# Patient Record
Sex: Female | Born: 1975 | Race: White | Hispanic: No | Marital: Married | Smoking: Never smoker
Health system: Southern US, Community
[De-identification: ages and names within clinical notes are randomized; demographics above are authoritative.]

## PROBLEM LIST (undated history)

## (undated) DIAGNOSIS — G4730 Sleep apnea, unspecified: Secondary | ICD-10-CM

## (undated) DIAGNOSIS — I10 Essential (primary) hypertension: Secondary | ICD-10-CM

## (undated) DIAGNOSIS — M1990 Unspecified osteoarthritis, unspecified site: Secondary | ICD-10-CM

## (undated) DIAGNOSIS — K760 Fatty (change of) liver, not elsewhere classified: Secondary | ICD-10-CM

## (undated) DIAGNOSIS — Z9889 Other specified postprocedural states: Secondary | ICD-10-CM

## (undated) DIAGNOSIS — K649 Unspecified hemorrhoids: Secondary | ICD-10-CM

## (undated) DIAGNOSIS — R7303 Prediabetes: Secondary | ICD-10-CM

## (undated) DIAGNOSIS — Z8719 Personal history of other diseases of the digestive system: Secondary | ICD-10-CM

## (undated) DIAGNOSIS — K219 Gastro-esophageal reflux disease without esophagitis: Secondary | ICD-10-CM

## (undated) DIAGNOSIS — R112 Nausea with vomiting, unspecified: Secondary | ICD-10-CM

## (undated) DIAGNOSIS — F32A Depression, unspecified: Secondary | ICD-10-CM

## (undated) DIAGNOSIS — R0600 Dyspnea, unspecified: Secondary | ICD-10-CM

## (undated) DIAGNOSIS — E781 Pure hyperglyceridemia: Secondary | ICD-10-CM

## (undated) DIAGNOSIS — T4145XA Adverse effect of unspecified anesthetic, initial encounter: Secondary | ICD-10-CM

## (undated) DIAGNOSIS — D649 Anemia, unspecified: Secondary | ICD-10-CM

## (undated) DIAGNOSIS — E119 Type 2 diabetes mellitus without complications: Secondary | ICD-10-CM

## (undated) DIAGNOSIS — T8859XA Other complications of anesthesia, initial encounter: Secondary | ICD-10-CM

## (undated) DIAGNOSIS — K900 Celiac disease: Secondary | ICD-10-CM

## (undated) DIAGNOSIS — R000 Tachycardia, unspecified: Secondary | ICD-10-CM

## (undated) DIAGNOSIS — F419 Anxiety disorder, unspecified: Secondary | ICD-10-CM

## (undated) DIAGNOSIS — J45909 Unspecified asthma, uncomplicated: Secondary | ICD-10-CM

## (undated) DIAGNOSIS — E282 Polycystic ovarian syndrome: Secondary | ICD-10-CM

## (undated) DIAGNOSIS — E0590 Thyrotoxicosis, unspecified without thyrotoxic crisis or storm: Secondary | ICD-10-CM

## (undated) HISTORY — DX: Pure hyperglyceridemia: E78.1

## (undated) HISTORY — DX: Polycystic ovarian syndrome: E28.2

## (undated) HISTORY — DX: Unspecified hemorrhoids: K64.9

## (undated) HISTORY — DX: Type 2 diabetes mellitus without complications: E11.9

## (undated) HISTORY — DX: Prediabetes: R73.03

## (undated) HISTORY — DX: Celiac disease: K90.0

---

## 2000-02-08 HISTORY — PX: APPENDECTOMY: SHX54

## 2003-02-08 HISTORY — PX: NISSEN FUNDOPLICATION: SHX2091

## 2014-06-09 ENCOUNTER — Encounter: Payer: Self-pay | Admitting: "Endocrinology

## 2015-01-20 ENCOUNTER — Encounter: Payer: Self-pay | Admitting: "Endocrinology

## 2015-03-02 ENCOUNTER — Encounter: Payer: Self-pay | Admitting: Internal Medicine

## 2015-03-17 ENCOUNTER — Ambulatory Visit: Payer: Self-pay | Admitting: Gastroenterology

## 2015-03-19 ENCOUNTER — Encounter: Payer: Self-pay | Admitting: Gastroenterology

## 2015-03-19 ENCOUNTER — Ambulatory Visit (INDEPENDENT_AMBULATORY_CARE_PROVIDER_SITE_OTHER): Payer: Managed Care, Other (non HMO) | Admitting: Gastroenterology

## 2015-03-19 VITALS — BP 149/90 | HR 83 | Temp 97.1°F | Ht 62.0 in | Wt 317.6 lb

## 2015-03-19 DIAGNOSIS — R945 Abnormal results of liver function studies: Secondary | ICD-10-CM

## 2015-03-19 DIAGNOSIS — R1011 Right upper quadrant pain: Secondary | ICD-10-CM | POA: Diagnosis not present

## 2015-03-19 DIAGNOSIS — R1031 Right lower quadrant pain: Secondary | ICD-10-CM

## 2015-03-19 DIAGNOSIS — K760 Fatty (change of) liver, not elsewhere classified: Secondary | ICD-10-CM | POA: Diagnosis not present

## 2015-03-19 DIAGNOSIS — R7989 Other specified abnormal findings of blood chemistry: Secondary | ICD-10-CM

## 2015-03-19 DIAGNOSIS — K625 Hemorrhage of anus and rectum: Secondary | ICD-10-CM | POA: Insufficient documentation

## 2015-03-19 NOTE — Assessment & Plan Note (Signed)
Colicky postprandial right upper quadrant pain for 4-5 years, intermittent. Cannot exclude biliary dyskinesia. Retrieve copy of labs, ultrasound, CT for review. Further recommendations to follow. May ultimately need a HIDA scan with [facility].

## 2015-03-19 NOTE — Progress Notes (Signed)
CC'D TO PCP °

## 2015-03-19 NOTE — Assessment & Plan Note (Signed)
Retrieve copy of ultrasound and most recent LFTs.  Instructions for fatty liver: Recommend 1-2# weight loss per week until ideal body weight through exercise & diet. Low fat/cholesterol diet.   Avoid sweets, sodas, fruit juices, sweetened beverages like tea, etc. Gradually increase exercise from 15 min daily up to 1 hr per day 5 days/week. Limit alcohol use.  Needs to have LFTs checked at least once per year. Discussed Melissa Schmidt could progress to cirrhosis with time. She needs to make an effort for regular exercise and weight loss.

## 2015-03-19 NOTE — Progress Notes (Signed)
Primary Care Physician:  Andres Shad, MD Referring provider: Myles Gip, MD Primary Gastroenterologist:  Garfield Cornea, MD   Chief Complaint  Patient presents with  . Abdominal Pain    right side    HPI:  Melissa Schmidt is a 40 y.o. female here at the request of her gynecologist Dr. Garnet Koyanagi, for further evaluation of right lower quadrant pain. Patient states that she developed right lower quadrant pain back around December 2016. She was waiting in a store in [place] started having vague right lower quadrant pain. Pain progressed over the next couple of days to the severity she went to the emergency department for evaluation. Radiation into the right lower back. Worse with movement. Unrelated to meals. Unrelated to bowel function. Reports unremarkable CT done. Has not been given any type of trial of medication. She did see her gynecologist, Dr. Garnet Koyanagi, pelvic exam benign, including pelvic ultrasound without explanation for her pain.  Patient also has a history of right upper quadrant, colicky pain over the past 4-5 years. Worse with meals, especially greasy foods. Sometimes associated with diarrhea. Some nausea but none recently. No vomiting. Abdominal ultrasound about a year ago she describes as being unremarkable. States she never had the HIDA scan done because the hospital wanted $1500 upfront before they filed her insurance and she couldn't afford it. History of Nissen fundoplication in 8416. Right upper quadrant pain associated with meals. Occasional bright red blood per rectum on the toilet tissue. States she has hemorrhoids since her daughter was born 63 years ago. No prior colonoscopy.  She states she was told she had fatty liver at some point. Reports her weight has been stable over the past year. Gives history of hypertriglyceridemia.   Current Outpatient Prescriptions  Medication Sig Dispense Refill  . metFORMIN (GLUCOPHAGE) 500 MG tablet Take 500 mg by mouth 2 (two)  times daily with a meal.    . Multiple Vitamin (MULTIVITAMIN) capsule Take 1 capsule by mouth daily.    . Omega-3 Fatty Acids (OMEGA-3 FISH OIL PO) Take by mouth daily.     No current facility-administered medications for this visit.    Allergies as of 03/19/2015  . (No Known Allergies)    Past Medical History  Diagnosis Date  . PCOS (polycystic ovarian syndrome)   . Borderline diabetes   . Hypertriglyceridemia     Past Surgical History  Procedure Laterality Date  . Appendectomy  2002  . Nissen fundoplication  6063    Family History  Problem Relation Age of Onset  . Irritable bowel syndrome Mother   . Inflammatory bowel disease Neg Hx   . Colon cancer Neg Hx   . Liver disease Neg Hx     Social History   Social History  . Marital Status: Unknown    Spouse Name: N/A  . Number of Children: 1  . Years of Education: N/A   Occupational History  . in school    Social History Main Topics  . Smoking status: Never Smoker   . Smokeless tobacco: Not on file  . Alcohol Use: No  . Drug Use: No  . Sexual Activity: Not on file   Other Topics Concern  . Not on file   Social History Narrative  . No narrative on file      ROS:  General: Negative for anorexia, weight loss, fever, chills, fatigue, weakness. Eyes: Negative for vision changes.  ENT: Negative for hoarseness, difficulty swallowing , nasal congestion. CV: Negative for chest pain, angina, palpitations,  dyspnea on exertion, peripheral edema.  Respiratory: Negative for dyspnea at rest, dyspnea on exertion, cough, sputum, wheezing.  GI: See history of present illness. GU:  Negative for dysuria, hematuria, urinary incontinence, urinary frequency, nocturnal urination.  MS: Negative for joint pain, low back pain.  Derm: Negative for rash or itching.  Neuro: Negative for weakness, abnormal sensation, seizure, frequent headaches, memory loss, confusion.  Psych: Negative for anxiety, depression, suicidal ideation,  hallucinations.  Endo: Negative for unusual weight change.  Heme: Negative for bruising or bleeding. Allergy: Negative for rash or hives.    Physical Examination:  BP 149/90 mmHg  Pulse 83  Temp(Src) 97.1 F (36.2 C) (Oral)  Ht 5' 2"  (1.575 m)  Wt 317 lb 9.6 oz (144.062 kg)  BMI 58.07 kg/m2  LMP 02/11/2015   General: Well-nourished, well-developed in no acute distress.  Head: Normocephalic, atraumatic.   Eyes: Conjunctiva pink, no icterus. Mouth: Oropharyngeal mucosa moist and pink , no lesions erythema or exudate. Neck: Supple without thyromegaly, masses, or lymphadenopathy.  Lungs: Clear to auscultation bilaterally.  Heart: Regular rate and rhythm, no murmurs rubs or gallops.  Abdomen: Bowel sounds are normal, mild right lower quadrant tenderness with palpation and right upper quadrant tenderness with palpation, nondistended but obese, no hepatosplenomegaly or masses, no abdominal bruits or    hernia , no rebound or guarding.   Rectal: Not performed Extremities: No lower extremity edema. No clubbing or deformities.  Neuro: Alert and oriented x 4 , grossly normal neurologically.  Skin: Warm and dry, no rash or jaundice.   Psych: Alert and cooperative, normal mood and affect.  Labs: Requested  Imaging Studies: No results found.

## 2015-03-19 NOTE — Assessment & Plan Note (Signed)
Two-month history of intermittent right lower quadrant tenderness worse with movement, unrelated to bowel function or meals. Remote appendectomy. Suspect musculoskeletal component. Retrieve labs and CT for review. Further recommendations to follow.

## 2015-03-19 NOTE — Assessment & Plan Note (Signed)
Intermittent bright red blood per rectum on the toilet tissue, she feels is related to hemorrhoids. Consider colonoscopy at some point in the near future. Await records prior to proceeding.

## 2015-03-19 NOTE — Patient Instructions (Signed)
1. We will request records of prior labs, abdominal ultrasound and CT reports for review. Further recommendations to follow.   Instructions for fatty liver: Recommend 1-2# weight loss per week until ideal body weight through exercise & diet. Low fat/cholesterol diet.   Avoid sweets, sodas, fruit juices, sweetened beverages like tea, etc. Gradually increase exercise from 15 min daily up to 1 hr per day 5 days/week. Limit alcohol use.

## 2015-04-01 NOTE — Progress Notes (Signed)
Labs and 01/20/2015 Glucose 102, BUN 10, creatinine 0.66, albumin 3.8, total bilirubin 0.3, alkaline phosphatase 79, AST 41 high, ALT 49 high, hemoglobin A1c 6.7 high  Labs from May 2016, white blood cell count 11,300, hemoglobin 12.6, platelets 363,000  Abdominal ultrasound dated 5 2016 with fatty liver associated with mild hepatomegaly, gallbladder normal, common bile duct 3 mm, spleen normal  NEVER RECEIVED CT REPORT. PLEASE TRY AGAIN. NEED ASAP TO DETERMINE PLAN OF CARE.

## 2015-04-01 NOTE — Progress Notes (Signed)
Called patient to verify where she had CT done and she said [facility]. She is going to call them and have them fax to [place] and I also asked for her to call me back with their fax number to resend release.

## 2015-04-01 NOTE — Progress Notes (Signed)
CT of the abdomen and pelvis with contrast dated 02/06/2015 report reviewed. Fatty liver and no other abnormalities.  Please let patient know that I have reviewed all of her records.  1. At this point, we need few labs to evaluate abnormal LFTs, suspect fatty liver but need to exclude few other things. (labs entered). 2. Also would recommend ibuprofen 691m twice daily for two weeks for the RLQ pain which is likely musculoskeletal.  3. Return to the office in 2-3 weeks. At that time will discuss if need to consider further work up based on symptoms (may offer colonoscopy and HIDA).

## 2015-04-01 NOTE — Addendum Note (Signed)
Addended by: Mahala Menghini on: 04/01/2015 04:40 PM   Modules accepted: Orders

## 2015-04-03 NOTE — Progress Notes (Signed)
Pt is aware. Lab order faxed to [facility]. She will go there and have it done.  Please schedule ov.

## 2015-04-03 NOTE — Progress Notes (Signed)
Pt goes to school on Tuesdays and cannot come in on that day

## 2015-04-06 ENCOUNTER — Encounter: Payer: Self-pay | Admitting: Internal Medicine

## 2015-04-06 NOTE — Progress Notes (Signed)
APPT MADE AND LETTER SENT  °

## 2015-04-08 LAB — HEPATITIS C ANTIBODY: HCV Ab: NEGATIVE

## 2015-04-08 LAB — HEPATITIS B SURFACE ANTIGEN: Hepatitis B Surface Ag: NEGATIVE

## 2015-04-08 LAB — ANA: Anti Nuclear Antibody(ANA): NEGATIVE

## 2015-04-08 LAB — IGG, IGA, IGM
IgA: 217 mg/dL (ref 69–380)
IgG (Immunoglobin G), Serum: 1370 mg/dL (ref 690–1700)
IgM, Serum: 187 mg/dL (ref 52–322)

## 2015-04-08 LAB — IRON AND TIBC
%SAT: 13 % (ref 11–50)
Iron: 36 ug/dL — ABNORMAL LOW (ref 40–190)
TIBC: 271 ug/dL (ref 250–450)
UIBC: 235 ug/dL (ref 125–400)

## 2015-04-08 LAB — FERRITIN: Ferritin: 144 ng/mL (ref 10–154)

## 2015-04-08 LAB — TISSUE TRANSGLUTAMINASE, IGA: Tissue Transglutaminase Ab, IgA: 19 U/mL — ABNORMAL HIGH (ref <4)

## 2015-04-09 LAB — MITOCHONDRIAL/SMOOTH MUSCLE AB PNL
Mitochondrial M2 Ab, IgG: <=20 Units (ref <=20.0)
Smooth Muscle Ab: <20 U (ref <20)

## 2015-04-09 LAB — CERULOPLASMIN: Ceruloplasmin: 37 mg/dL (ref 18–53)

## 2015-04-13 NOTE — Progress Notes (Signed)
Quick Note:  Her labs are negative for viral hepatitis, Wilson's disease, autoimmune hepatitis, PBC, hemochromatosis.  Her screen is positive for celiac disease.  Keep upcoming OV, she will need TCS/EGd WITH sb BX. Please let patient know: DO NOT start gluten free diet because it will mess up the small bowel bx. ______

## 2015-04-23 ENCOUNTER — Ambulatory Visit: Payer: Managed Care, Other (non HMO) | Admitting: Gastroenterology

## 2015-04-29 ENCOUNTER — Other Ambulatory Visit: Payer: Self-pay

## 2015-04-29 ENCOUNTER — Ambulatory Visit (INDEPENDENT_AMBULATORY_CARE_PROVIDER_SITE_OTHER): Payer: Managed Care, Other (non HMO) | Admitting: Gastroenterology

## 2015-04-29 ENCOUNTER — Encounter: Payer: Self-pay | Admitting: Gastroenterology

## 2015-04-29 VITALS — BP 147/93 | HR 92 | Temp 98.0°F | Ht 62.0 in | Wt 321.2 lb

## 2015-04-29 DIAGNOSIS — R1031 Right lower quadrant pain: Secondary | ICD-10-CM

## 2015-04-29 DIAGNOSIS — R6889 Other general symptoms and signs: Secondary | ICD-10-CM | POA: Diagnosis not present

## 2015-04-29 DIAGNOSIS — R1011 Right upper quadrant pain: Secondary | ICD-10-CM

## 2015-04-29 DIAGNOSIS — K625 Hemorrhage of anus and rectum: Secondary | ICD-10-CM | POA: Diagnosis not present

## 2015-04-29 DIAGNOSIS — K760 Fatty (change of) liver, not elsewhere classified: Secondary | ICD-10-CM

## 2015-04-29 DIAGNOSIS — R768 Other specified abnormal immunological findings in serum: Secondary | ICD-10-CM | POA: Insufficient documentation

## 2015-04-29 MED ORDER — PEG 3350-KCL-NA BICARB-NACL 420 G PO SOLR: 4000.0000 mL | Freq: Once | ORAL | Status: DC

## 2015-04-29 NOTE — Progress Notes (Signed)
Primary Care Physician: Andres Shad, MD  Primary Gastroenterologist:  Garfield Cornea, MD   Chief Complaint  Patient presents with  . Abdominal Pain    HPI: Melissa Schmidt is a 40 y.o. female here for follow-up. She is seen back on 03/19/2015 for right-sided abdominal pain. She has 2 different right-sided pains. One in the right lower quadrant worse with movement, unrelated to meals or bowel movements. This began in December 2016. She also has a colicky right upper quadrant pain for 4-5 years, worse with meals, especially greasy foods. Sometimes associated with diarrhea and nausea. No vomiting. History of [place] fundoplication in 2330, last EGD done prior to the procedure. She also has intermittent bright red blood on the toilet tissue.  Retrieved records which showed elevated AST/ALT (minimal). Abdominal ultrasound from 2016 with fatty liver associated with mild hepatomegaly, gallbladder was normal. CT of the abdomen pelvis with contrast in December 2016 with fatty liver but no other abnormalities.  Bloodwork was done in February with negative ANA, mitochondrial and smooth muscle antibody, normal immunoglobulins, normal ceruloplasmin, ferritin, negative hepatitis B and C serologies. She did have a positive TTG.  She continues to have right lower quadrant abdominal pain which is worse with movement, cough. She continues to have right upper quadrant pain with radiation into the right upper back associated with nausea which occurs postprandially. Intermittent diarrhea. Intermittent bright red blood per rectum. Trial of NSAIDs did not help her musculoskeletal pain.   Current Outpatient Prescriptions  Medication Sig Dispense Refill  . metFORMIN (GLUCOPHAGE) 500 MG tablet Take 500 mg by mouth 2 (two) times daily with a meal.    . Multiple Vitamin (MULTIVITAMIN) capsule Take 1 capsule by mouth daily.    . Omega-3 Fatty Acids (OMEGA-3 FISH OIL PO) Take by mouth daily.     No current  facility-administered medications for this visit.    Allergies as of 04/29/2015  . (No Known Allergies)   Past Medical History  Diagnosis Date  . PCOS (polycystic ovarian syndrome)   . Borderline diabetes   . Hypertriglyceridemia    Past Surgical History  Procedure Laterality Date  . Appendectomy  2002  . Nissen fundoplication  0762   Family History  Problem Relation Age of Onset  . Irritable bowel syndrome Mother   . Inflammatory bowel disease Neg Hx   . Colon cancer Neg Hx   . Liver disease Neg Hx    Social History   Social History  . Marital Status: Unknown    Spouse Name: N/A  . Number of Children: 1  . Years of Education: N/A   Occupational History  . in school    Social History Main Topics  . Smoking status: Never Smoker   . Smokeless tobacco: None  . Alcohol Use: No  . Drug Use: No  . Sexual Activity: Not Asked   Other Topics Concern  . None   Social History Narrative     ROS:  General: Negative for anorexia, weight loss, fever, chills, fatigue, weakness. ENT: Negative for hoarseness, difficulty swallowing , nasal congestion. CV: Negative for chest pain, angina, palpitations, dyspnea on exertion, peripheral edema.  Respiratory: Negative for dyspnea at rest, dyspnea on exertion, cough, sputum, wheezing.  GI: See history of present illness. GU:  Negative for dysuria, hematuria, urinary incontinence, urinary frequency, nocturnal urination.  Endo: Negative for unusual weight change.    Physical Examination:   BP 147/93 mmHg  Pulse 92  Temp(Src) 98 F (36.7  C) (Oral)  Ht 5' 2"  (1.575 m)  Wt 321 lb 3.2 oz (145.695 kg)  BMI 58.73 kg/m2  General: Well-nourished, well-developed in no acute distress.  Eyes: No icterus. Mouth: Oropharyngeal mucosa moist and pink , no lesions erythema or exudate. Lungs: Clear to auscultation bilaterally.  Heart: Regular rate and rhythm, no murmurs rubs or gallops.  Abdomen: Bowel sounds are normal, nontender,  nondistended, no hepatosplenomegaly or masses, no abdominal bruits or hernia , no rebound or guarding.   Extremities: No lower extremity edema. No clubbing or deformities. Neuro: Alert and oriented x 4   Skin: Warm and dry, no jaundice.   Psych: Alert and cooperative, normal mood and affect.  Labs:   See above  Imaging Studies: No results found.  Impression/plan:  40 year old morbidly obese female who presents for follow-up of colicky right upper quadrant abdominal pain postprandially, right lower quadrant abdominal pain likely musculoskeletal, intermittent rectal bleeding/diarrhea. Noted to have fatty liver with mild hepatomegaly on imaging studies which led to lab workup. She was noted to have a positive TTG but other serologies unremarkable as outlined above. No mentioned above she does have some solid food dysphagia since her Nissen fundoplication over 10 years ago.   She needs upper endoscopy with small bowel biopsy for confirmation of celiac disease +/- esophageal dilation It is unclear that this is the etiology of her abdominal symptoms at this time. Based on findings, she may ultimately need to have completion of gallbladder workup. Also needs colonoscopy for intermittent bright red blood per rectum. Patient states she had difficult conscious sedation in the past. Plan on deep sedation in the OR given this history.  I have discussed the risks, alternatives, benefits with regards to but not limited to the risk of reaction to medication, bleeding, infection, perforation and the patient is agreeable to proceed. Written consent to be obtained.  Fatty liver. Instructions for fatty liver: Recommend 1-2# weight loss per week until ideal body weight through exercise & diet. Low fat/cholesterol diet.   Avoid sweets, sodas, fruit juices, sweetened beverages like tea, etc. Gradually increase exercise from 15 min daily up to 1 hr per day 5 days/week. Limit alcohol use.

## 2015-04-29 NOTE — Patient Instructions (Signed)
1. Colonoscopy and upper endoscopy with possible esophageal dilation and small bowel biopsy with Dr. Gala Romney. See separate instructions.  2. Once results are back, we will decide if you need further gallbladder work up and discuss celiac disease further if positive biopsies.   Instructions for fatty liver: Recommend 1-2# weight loss per week until ideal body weight through exercise & diet. Low fat/cholesterol diet.   Avoid sweets, sodas, fruit juices, sweetened beverages like tea, etc. Gradually increase exercise from 15 min daily up to 1 hr per day 5 days/week. Limit alcohol use.

## 2015-04-29 NOTE — Progress Notes (Signed)
CC'D TO PCP °

## 2015-05-20 NOTE — Patient Instructions (Signed)
Melissa Schmidt  05/20/2015     @PREFPERIOPPHARMACY @   Your procedure is scheduled on 05/25/2015.  Report to [facility] at 9:00 A.M.  Call this number if you have problems the morning of surgery:  [phone]   Remember:  Do not eat food or drink liquids after midnight.  Take these medicines the morning of surgery with A SIP OF WATER Flonase   Do not wear jewelry, make-up or nail polish.  Do not wear lotions, powders, or perfumes.  You may wear deodorant.  Do not shave 48 hours prior to surgery.  Men may shave face and neck.  Do not bring valuables to the hospital.  [facility] is not responsible for any belongings or valuables.  Contacts, dentures or bridgework may not be worn into surgery.  Leave your suitcase in the car.  After surgery it may be brought to your room.  For patients admitted to the hospital, discharge time will be determined by your treatment team.  Patients discharged the day of surgery will not be allowed to drive home.    Please read over the following fact sheets that you were given. Anesthesia Post-op Instructions     PATIENT INSTRUCTIONS POST-ANESTHESIA  IMMEDIATELY FOLLOWING SURGERY:  Do not drive or operate machinery for the first twenty four hours after surgery.  Do not make any important decisions for twenty four hours after surgery or while taking narcotic pain medications or sedatives.  If you develop intractable nausea and vomiting or a severe headache please notify your doctor immediately.  FOLLOW-UP:  Please make an appointment with your surgeon as instructed. You do not need to follow up with anesthesia unless specifically instructed to do so.  WOUND CARE INSTRUCTIONS (if applicable):  Keep a dry clean dressing on the anesthesia/puncture wound site if there is drainage.  Once the wound has quit draining you may leave it open to air.  Generally you should leave the bandage intact for twenty four hours unless there is drainage.  If the epidural  site drains for more than 36-48 hours please call the anesthesia department.  QUESTIONS?:  Please feel free to call your physician or the hospital operator if you have any questions, and they will be happy to assist you.      Esophagogastroduodenoscopy Esophagogastroduodenoscopy (EGD) is a procedure that is used to examine the lining of the esophagus, stomach, and first part of the small intestine (duodenum). A long, flexible, lighted tube with a camera attached (endoscope) is inserted down the throat to view these organs. This procedure is done to detect problems or abnormalities, such as inflammation, bleeding, ulcers, or growths, in order to treat them. The procedure lasts 5-20 minutes. It is usually an outpatient procedure, but it may need to be performed in a hospital in emergency cases. LET [facility] CARE PROVIDER KNOW ABOUT:  Any allergies you have.  All medicines you are taking, including vitamins, herbs, eye drops, creams, and over-the-counter medicines.  Previous problems you or members of your family have had with the use of anesthetics.  Any blood disorders you have.  Previous surgeries you have had.  Medical conditions you have. RISKS AND COMPLICATIONS Generally, this is a safe procedure. However, problems can occur and include:  Infection.  Bleeding.  Tearing (perforation) of the esophagus, stomach, or duodenum.  Difficulty breathing or not being able to breathe.  Excessive sweating.  Spasms of the larynx.  Slowed heartbeat.  Low blood pressure. BEFORE THE PROCEDURE  Do not  eat or drink anything after midnight on the night before the procedure or as directed by your health care provider.  Do not take your regular medicines before the procedure if your health care provider asks you not to. Ask your health care provider about changing or stopping those medicines.  If you wear dentures, be prepared to remove them before the procedure.  Arrange for someone to  drive you home after the procedure. PROCEDURE  A numbing medicine (local anesthetic) may be sprayed in your throat for comfort and to stop you from gagging or coughing.  You will have an IV tube inserted in a vein in your hand or arm. You will receive medicines and fluids through this tube.  You will be given a medicine to relax you (sedative).  A pain reliever will be given through the IV tube.  A mouth guard may be placed in your mouth to protect your teeth and to keep you from biting on the endoscope.  You will be asked to lie on your left side.  The endoscope will be inserted down your throat and into your esophagus, stomach, and duodenum.  Air will be put through the endoscope to allow your health care provider to clearly view the lining of your esophagus.  The lining of your esophagus, stomach, and duodenum will be examined. During the exam, your health care provider may:  Remove tissue to be examined under a microscope (biopsy) for inflammation, infection, or other medical problems.  Remove growths.  Remove objects (foreign bodies) that are stuck.  Treat any bleeding with medicines or other devices that stop tissues from bleeding (hot cautery, clipping devices).  Widen (dilate) or stretch narrowed areas of your esophagus and stomach.  The endoscope will be withdrawn. AFTER THE PROCEDURE  You will be taken to a recovery area for observation. Your blood pressure, heart rate, breathing rate, and blood oxygen level will be monitored often until the medicines you were given have worn off.  Do not eat or drink anything until the numbing medicine has worn off and your gag reflex has returned. You may choke.  Your health care provider should be able to discuss his or her findings with you. It will take longer to discuss the test results if any biopsies were taken.   This information is not intended to replace advice given to you by your health care provider. Make sure you  discuss any questions you have with your health care provider.   Document Released: 05/27/2004 Document Revised: 02/14/2014 Document Reviewed: 12/28/2011 Elsevier Interactive Patient Education 2016 [employer] Esophageal Dilatation Esophageal dilatation is a procedure to open a blocked or narrowed part of the esophagus. The esophagus is the long tube in your throat that carries food and liquid from your mouth to your stomach. The procedure is also called esophageal dilation.  You may need this procedure if you have a buildup of scar tissue in your esophagus that makes it difficult, painful, or even impossible to swallow. This can be caused by gastroesophageal reflux disease (GERD). In rare cases, people need this procedure because they have cancer of the esophagus or a problem with the way food moves through the esophagus. Sometimes you may need to have another dilatation to enlarge the opening of the esophagus gradually. LET [facility] CARE PROVIDER KNOW ABOUT:   Any allergies you have.  All medicines you are taking, including vitamins, herbs, eye drops, creams, and over-the-counter medicines.  Previous problems you or members of your family  have had with the use of anesthetics.  Any blood disorders you have.  Previous surgeries you have had.  Medical conditions you have.  Any antibiotic medicines you are required to take before dental procedures. RISKS AND COMPLICATIONS Generally, this is a safe procedure. However, problems can occur and include:  Bleeding from a tear in the lining of the esophagus.  A hole (perforation) in the esophagus. BEFORE THE PROCEDURE  Do not eat or drink anything after midnight on the night before the procedure or as directed by your health care provider.  Ask your health care provider about changing or stopping your regular medicines. This is especially important if you are taking diabetes medicines or blood thinners.  Plan to have someone take you  home after the procedure. PROCEDURE   You will be given a medicine that makes you relaxed and sleepy (sedative).  A medicine may be sprayed or gargled to numb the back of the throat.  Your health care provider can use various instruments to do an esophageal dilatation. During the procedure, the instrument used will be placed in your mouth and passed down into your esophagus. Options include:  Simple dilators. This instrument is carefully placed in the esophagus to stretch it.  Guided wire bougies. In this method, a flexible tube (endoscope) is used to insert a wire into the esophagus. The dilator is passed over this wire to enlarge the esophagus. Then the wire is removed.  Balloon dilators. An endoscope with a small balloon at the end is passed down into the esophagus. Inflating the balloon gently stretches the esophagus and opens it up. AFTER THE PROCEDURE  Your blood pressure, heart rate, breathing rate, and blood oxygen level will be monitored often until the medicines you were given have worn off.  Your throat may feel slightly sore and will probably still feel numb. This will improve slowly over time.  You will not be allowed to eat or drink until the throat numbness has resolved.  If this is a same-day procedure, you may be allowed to go home once you have been able to drink, urinate, and sit on the edge of the bed without nausea or dizziness.  If this is a same-day procedure, you should have a friend or family member with you for the next 24 hours after the procedure.   This information is not intended to replace advice given to you by your health care provider. Make sure you discuss any questions you have with your health care provider.   Document Released: 03/17/2005 Document Revised: 02/14/2014 Document Reviewed: 06/05/2013 Elsevier Interactive Patient Education 2016 [employer]. Colonoscopy A colonoscopy is an exam to look at the entire large intestine (colon). This exam  can help find problems such as tumors, polyps, inflammation, and areas of bleeding. The exam takes about 1 hour.  LET [facility] CARE PROVIDER KNOW ABOUT:   Any allergies you have.  All medicines you are taking, including vitamins, herbs, eye drops, creams, and over-the-counter medicines.  Previous problems you or members of your family have had with the use of anesthetics.  Any blood disorders you have.  Previous surgeries you have had.  Medical conditions you have. RISKS AND COMPLICATIONS  Generally, this is a safe procedure. However, as with any procedure, complications can occur. Possible complications include:  Bleeding.  Tearing or rupture of the colon wall.  Reaction to medicines given during the exam.  Infection (rare). BEFORE THE PROCEDURE   Ask your health care provider about changing  or stopping your regular medicines.  You may be prescribed an oral bowel prep. This involves drinking a large amount of medicated liquid, starting the day before your procedure. The liquid will cause you to have multiple loose stools until your stool is almost clear or light green. This cleans out your colon in preparation for the procedure.  Do not eat or drink anything else once you have started the bowel prep, unless your health care provider tells you it is safe to do so.  Arrange for someone to drive you home after the procedure. PROCEDURE   You will be given medicine to help you relax (sedative).  You will lie on your side with your knees bent.  A long, flexible tube with a light and camera on the end (colonoscope) will be inserted through the rectum and into the colon. The camera sends video back to a computer screen as it moves through the colon. The colonoscope also releases carbon dioxide gas to inflate the colon. This helps your health care provider see the area better.  During the exam, your health care provider may take a small tissue sample (biopsy) to be examined under  a microscope if any abnormalities are found.  The exam is finished when the entire colon has been viewed. AFTER THE PROCEDURE   Do not drive for 24 hours after the exam.  You may have a small amount of blood in your stool.  You may pass moderate amounts of gas and have mild abdominal cramping or bloating. This is caused by the gas used to inflate your colon during the exam.  Ask when your test results will be ready and how you will get your results. Make sure you get your test results.   This information is not intended to replace advice given to you by your health care provider. Make sure you discuss any questions you have with your health care provider.   Document Released: 01/22/2000 Document Revised: 11/14/2012 Document Reviewed: 10/01/2012 Elsevier Interactive Patient Education [employer].

## 2015-05-21 ENCOUNTER — Encounter (HOSPITAL_COMMUNITY): Payer: Self-pay

## 2015-05-21 ENCOUNTER — Encounter (HOSPITAL_COMMUNITY)
Admission: RE | Admit: 2015-05-21 | Discharge: 2015-05-21 | Disposition: A | Discharge status: Home / Self Care | Payer: Managed Care, Other (non HMO) | Source: Ambulatory Visit | Attending: Internal Medicine | Admitting: Internal Medicine

## 2015-05-21 DIAGNOSIS — Z01812 Encounter for preprocedural laboratory examination: Secondary | ICD-10-CM | POA: Insufficient documentation

## 2015-05-21 HISTORY — DX: Unspecified osteoarthritis, unspecified site: M19.90

## 2015-05-21 LAB — CBC
HCT: 35.6 % — ABNORMAL LOW (ref 36.0–46.0)
Hemoglobin: 11.2 g/dL — ABNORMAL LOW (ref 12.0–15.0)
MCH: 25.4 pg — ABNORMAL LOW (ref 26.0–34.0)
MCHC: 31.5 g/dL (ref 30.0–36.0)
MCV: 80.7 fL (ref 78.0–100.0)
Platelets: 326 10*3/uL (ref 150–400)
RBC: 4.41 MIL/uL (ref 3.87–5.11)
RDW: 15 % (ref 11.5–15.5)
WBC: 9.6 10*3/uL (ref 4.0–10.5)

## 2015-05-21 LAB — BASIC METABOLIC PANEL
Anion gap: 10 (ref 5–15)
BUN: 13 mg/dL (ref 6–20)
CO2: 27 mmol/L (ref 22–32)
Calcium: 9.4 mg/dL (ref 8.9–10.3)
Chloride: 104 mmol/L (ref 101–111)
Creatinine, Ser: 0.79 mg/dL (ref 0.44–1.00)
GFR calc Af Amer: >60 mL/min (ref >60)
GFR calc non Af Amer: >60 mL/min (ref >60)
Glucose, Bld: 163 mg/dL — ABNORMAL HIGH (ref 65–99)
Potassium: 4.2 mmol/L (ref 3.5–5.1)
Sodium: 141 mmol/L (ref 135–145)

## 2015-05-21 LAB — HCG, SERUM, QUALITATIVE: Preg, Serum: NEGATIVE

## 2015-05-21 NOTE — Pre-Procedure Instructions (Signed)
Patient given information to sign up for my chart at home. 

## 2015-05-25 ENCOUNTER — Ambulatory Visit (HOSPITAL_COMMUNITY)
Admission: RE | Admit: 2015-05-25 | Discharge: 2015-05-25 | Disposition: A | Discharge status: Home / Self Care | Payer: Managed Care, Other (non HMO) | Source: Ambulatory Visit | Attending: Internal Medicine | Admitting: Internal Medicine

## 2015-05-25 ENCOUNTER — Encounter (HOSPITAL_COMMUNITY)
Admission: RE | Disposition: A | Discharge status: Home / Self Care | Payer: Self-pay | Source: Ambulatory Visit | Attending: Internal Medicine

## 2015-05-25 ENCOUNTER — Encounter (HOSPITAL_COMMUNITY): Payer: Self-pay | Admitting: *Deleted

## 2015-05-25 ENCOUNTER — Ambulatory Visit (HOSPITAL_COMMUNITY): Payer: Managed Care, Other (non HMO) | Admitting: Anesthesiology

## 2015-05-25 DIAGNOSIS — Z79899 Other long term (current) drug therapy: Secondary | ICD-10-CM | POA: Insufficient documentation

## 2015-05-25 DIAGNOSIS — R1310 Dysphagia, unspecified: Secondary | ICD-10-CM | POA: Insufficient documentation

## 2015-05-25 DIAGNOSIS — K641 Second degree hemorrhoids: Secondary | ICD-10-CM | POA: Diagnosis not present

## 2015-05-25 DIAGNOSIS — R7303 Prediabetes: Secondary | ICD-10-CM | POA: Insufficient documentation

## 2015-05-25 DIAGNOSIS — K259 Gastric ulcer, unspecified as acute or chronic, without hemorrhage or perforation: Secondary | ICD-10-CM | POA: Diagnosis not present

## 2015-05-25 DIAGNOSIS — K760 Fatty (change of) liver, not elsewhere classified: Secondary | ICD-10-CM | POA: Diagnosis not present

## 2015-05-25 DIAGNOSIS — Z6841 Body Mass Index (BMI) 40.0 and over, adult: Secondary | ICD-10-CM | POA: Insufficient documentation

## 2015-05-25 DIAGNOSIS — K3189 Other diseases of stomach and duodenum: Secondary | ICD-10-CM | POA: Diagnosis not present

## 2015-05-25 DIAGNOSIS — K2960 Other gastritis without bleeding: Secondary | ICD-10-CM | POA: Insufficient documentation

## 2015-05-25 DIAGNOSIS — K640 First degree hemorrhoids: Secondary | ICD-10-CM | POA: Insufficient documentation

## 2015-05-25 DIAGNOSIS — R1031 Right lower quadrant pain: Secondary | ICD-10-CM | POA: Insufficient documentation

## 2015-05-25 DIAGNOSIS — K921 Melena: Secondary | ICD-10-CM

## 2015-05-25 DIAGNOSIS — K319 Disease of stomach and duodenum, unspecified: Secondary | ICD-10-CM | POA: Diagnosis not present

## 2015-05-25 DIAGNOSIS — R760 Raised antibody titer: Secondary | ICD-10-CM | POA: Diagnosis not present

## 2015-05-25 DIAGNOSIS — R1011 Right upper quadrant pain: Secondary | ICD-10-CM | POA: Insufficient documentation

## 2015-05-25 DIAGNOSIS — R160 Hepatomegaly, not elsewhere classified: Secondary | ICD-10-CM | POA: Insufficient documentation

## 2015-05-25 DIAGNOSIS — R109 Unspecified abdominal pain: Secondary | ICD-10-CM | POA: Diagnosis present

## 2015-05-25 DIAGNOSIS — Z7984 Long term (current) use of oral hypoglycemic drugs: Secondary | ICD-10-CM | POA: Diagnosis not present

## 2015-05-25 HISTORY — PX: ESOPHAGEAL DILATION: SHX303

## 2015-05-25 HISTORY — DX: Other complications of anesthesia, initial encounter: T88.59XA

## 2015-05-25 HISTORY — PX: ESOPHAGOGASTRODUODENOSCOPY (EGD) WITH PROPOFOL: SHX5813

## 2015-05-25 HISTORY — DX: Adverse effect of unspecified anesthetic, initial encounter: T41.45XA

## 2015-05-25 HISTORY — PX: COLONOSCOPY WITH PROPOFOL: SHX5780

## 2015-05-25 LAB — GLUCOSE, CAPILLARY
Glucose-Capillary: 140 mg/dL — ABNORMAL HIGH (ref 65–99)
Glucose-Capillary: 109 mg/dL — ABNORMAL HIGH (ref 65–99)

## 2015-05-25 SURGERY — COLONOSCOPY WITH PROPOFOL
Anesthesia: Monitor Anesthesia Care

## 2015-05-25 MED ORDER — FENTANYL CITRATE (PF) 100 MCG/2ML IJ SOLN
INTRAMUSCULAR | Status: AC
  Filled 2015-05-25: qty 2

## 2015-05-25 MED ORDER — LACTATED RINGERS IV SOLN
INTRAVENOUS | Status: DC
  Administered 2015-05-25: 09:00:00 via INTRAVENOUS

## 2015-05-25 MED ORDER — FENTANYL CITRATE (PF) 100 MCG/2ML IJ SOLN
25.0000 ug | INTRAMUSCULAR | Status: AC
  Administered 2015-05-25 (×2): 25 ug via INTRAVENOUS

## 2015-05-25 MED ORDER — ONDANSETRON HCL 4 MG/2ML IJ SOLN: 4.0000 mg | Freq: Once | INTRAMUSCULAR | Status: DC | PRN

## 2015-05-25 MED ORDER — ONDANSETRON HCL 4 MG/2ML IJ SOLN
INTRAMUSCULAR | Status: AC
  Filled 2015-05-25: qty 2

## 2015-05-25 MED ORDER — PROPOFOL 500 MG/50ML IV EMUL
INTRAVENOUS | Status: DC | PRN
  Administered 2015-05-25: 125 ug/kg/min via INTRAVENOUS
  Administered 2015-05-25 (×2): via INTRAVENOUS

## 2015-05-25 MED ORDER — LIDOCAINE VISCOUS 2 % MT SOLN
5.0000 mL | OROMUCOSAL | Status: AC
  Administered 2015-05-25 (×2): 5 mL via OROMUCOSAL

## 2015-05-25 MED ORDER — GLYCOPYRROLATE 0.2 MG/ML IJ SOLN
INTRAMUSCULAR | Status: AC
  Filled 2015-05-25: qty 1

## 2015-05-25 MED ORDER — MIDAZOLAM HCL 2 MG/2ML IJ SOLN
1.0000 mg | INTRAMUSCULAR | Status: DC | PRN
  Administered 2015-05-25: 2 mg via INTRAVENOUS

## 2015-05-25 MED ORDER — MIDAZOLAM HCL 2 MG/2ML IJ SOLN
INTRAMUSCULAR | Status: AC
  Filled 2015-05-25: qty 2

## 2015-05-25 MED ORDER — LIDOCAINE VISCOUS 2 % MT SOLN
OROMUCOSAL | Status: AC
  Filled 2015-05-25: qty 15

## 2015-05-25 MED ORDER — PROPOFOL 10 MG/ML IV BOLUS
INTRAVENOUS | Status: AC
  Filled 2015-05-25: qty 40

## 2015-05-25 MED ORDER — FENTANYL CITRATE (PF) 100 MCG/2ML IJ SOLN: 25.0000 ug | INTRAMUSCULAR | Status: DC | PRN

## 2015-05-25 MED ORDER — ONDANSETRON HCL 4 MG/2ML IJ SOLN
4.0000 mg | Freq: Once | INTRAMUSCULAR | Status: AC
  Administered 2015-05-25: 4 mg via INTRAVENOUS

## 2015-05-25 MED ORDER — GLYCOPYRROLATE 0.2 MG/ML IJ SOLN
0.2000 mg | Freq: Once | INTRAMUSCULAR | Status: AC
  Administered 2015-05-25: 0.2 mg via INTRAVENOUS

## 2015-05-25 NOTE — Discharge Instructions (Signed)
Colonoscopy Discharge Instructions  Read the instructions outlined below and refer to this sheet in the next few weeks. These discharge instructions provide you with general information on caring for yourself after you leave the hospital. Your doctor may also give you specific instructions. While your treatment has been planned according to the most current medical practices available, unavoidable complications occasionally occur. If you have any problems or questions after discharge, call Dr. Gala Romney at [phone]. ACTIVITY  You may resume your regular activity, but move at a slower pace for the next 24 hours.   Take frequent rest periods for the next 24 hours.   Walking will help get rid of the air and reduce the bloated feeling in your belly (abdomen).   No driving for 24 hours (because of the medicine (anesthesia) used during the test).    Do not sign any important legal documents or operate any machinery for 24 hours (because of the anesthesia used during the test).  NUTRITION  Drink plenty of fluids.   You may resume your normal diet as instructed by your doctor.   Begin with a light meal and progress to your normal diet. Heavy or fried foods are harder to digest and may make you feel sick to your stomach (nauseated).   Avoid alcoholic beverages for 24 hours or as instructed.  MEDICATIONS  You may resume your normal medications unless your doctor tells you otherwise.  WHAT YOU CAN EXPECT TODAY  Some feelings of bloating in the abdomen.   Passage of more gas than usual.   Spotting of blood in your stool or on the toilet paper.  IF YOU HAD POLYPS REMOVED DURING THE COLONOSCOPY:  No aspirin products for 7 days or as instructed.   No alcohol for 7 days or as instructed.   Eat a soft diet for the next 24 hours.  FINDING OUT THE RESULTS OF YOUR TEST Not all test results are available during your visit. If your test results are not back during the visit, make an appointment  with your caregiver to find out the results. Do not assume everything is normal if you have not heard from your caregiver or the medical facility. It is important for you to follow up on all of your test results.  SEEK IMMEDIATE MEDICAL ATTENTION IF:  You have more than a spotting of blood in your stool.   Your belly is swollen (abdominal distention).   You are nauseated or vomiting.   You have a temperature over 101.  You have abdominal pain or discomfort that is severe or gets worse throughout the day. EGD Discharge instructions Please read the instructions outlined below and refer to this sheet in the next few weeks. These discharge instructions provide you with general information on caring for yourself after you leave the hospital. Your doctor may also give you specific instructions. While your treatment has been planned according to the most current medical practices available, unavoidable complications occasionally occur. If you have any problems or questions after discharge, please call your doctor. ACTIVITY You may resume your regular activity but move at a slower pace for the next 24 hours.  Take frequent rest periods for the next 24 hours.  Walking will help expel (get rid of) the air and reduce the bloated feeling in your abdomen.  No driving for 24 hours (because of the anesthesia (medicine) used during the test).  You may shower.  Do not sign any important legal documents or operate any machinery for 24  hours (because of the anesthesia used during the test).  NUTRITION Drink plenty of fluids.  You may resume your normal diet.  Begin with a light meal and progress to your normal diet.  Avoid alcoholic beverages for 24 hours or as instructed by your caregiver.  MEDICATIONS You may resume your normal medications unless your caregiver tells you otherwise.  WHAT YOU CAN EXPECT TODAY You may experience abdominal discomfort such as a feeling of fullness or gas pains.   FOLLOW-UP Your doctor will discuss the results of your test with you.  SEEK IMMEDIATE MEDICAL ATTENTION IF ANY OF THE FOLLOWING OCCUR: Excessive nausea (feeling sick to your stomach) and/or vomiting.  Severe abdominal pain and distention (swelling).  Trouble swallowing.  Temperature over 101 F (37.8 C).  Rectal bleeding or vomiting of blood.    GERD and hemorrhoid information provided  Hemorrhoid banding pamphlet provided  Further recommendations to follow pending review of pathology report  Office visit with me in 4-6 weeks for hemorrhoid banding  Nonsurgical Procedures for Hemorrhoids Nonsurgical procedures can be used to treat hemorrhoids. Hemorrhoids are swollen veins that are inside the rectum (internal hemorrhoids) or around the anus (external hemorrhoids). They are caused by increased pressure in the anal area. This pressure may result from straining to have a bowel movement (constipation), diarrhea, pregnancy, obesity, anal sex, or sitting for long periods of time. Hemorrhoids can cause symptoms such as pain and bleeding. Various procedures may be performed if diet changes, lifestyle changes, and other treatments do not help your symptoms. Some of these procedures do not involve surgery. Three common nonsurgical procedures are:  Rubber band ligation. Rubber bands are used to cut off the blood supply to the hemorrhoids.  Sclerotherapy. Medicine is injected into the hemorrhoids to shrink them.  Infrared coagulation. A type of light energy is used to get rid of the hemorrhoids. LET [facility] CARE PROVIDER KNOW ABOUT:  Any allergies you have.  All medicines you are taking, including vitamins, herbs, eye drops, creams, and over-the-counter medicines.  Previous problems you or members of your family have had with the use of anesthetics.  Any blood disorders you have.  Previous surgeries you have had.  Any medical conditions you have.  Whether you are pregnant or may  be pregnant. RISKS AND COMPLICATIONS Generally, this is a safe procedure. However, problems may occur, including:  Infection.  Bleeding.  Pain. BEFORE THE PROCEDURE  Ask your health care provider about:  Changing or stopping your regular medicines. This is especially important if you are taking diabetes medicines or blood thinners.  Taking medicines such as aspirin and ibuprofen. These medicines can thin your blood. Do not take these medicines before your procedure if your health care provider instructs you not to.  You may need to have a procedure to examine the inside of your colon with a scope (colonoscopy). Your health care provider may do this to make sure that there are no other causes for your bleeding or pain. PROCEDURE  Your health care provider will clean your rectal area with a rinsing solution.  A lubricating jelly may be placed into your rectum. The jelly may contain a medicine to numb the area (local anesthetic).  Your health care provider will insert a short scope (anoscope) into your rectum to examine the hemorrhoids.  One of the following techniques will be used. Rubber Band Ligation Your health care provider will place medical instruments through the scope to put rubber bands around the base of your  hemorrhoids. The bands will cut off the blood supply to the hemorrhoids. The hemorrhoids will fall off after several days. Sclerotherapy Your health care provider will inject medicine through the scope into your hemorrhoids. This will cause them to shrink and dry up. Infrared Coagulation Your health care provider will shine a type of light through the scope onto your hemorrhoids. This light will generate energy (infrared radiation). It will cause the hemorrhoids to scar and then fall off. Each of these procedures may vary among health care providers and hospitals. AFTER THE PROCEDURE  You will be monitored to make sure that you have no bleeding.  Return to your  normal activities as told by your health care provider.   This information is not intended to replace advice given to you by your health care provider. Make sure you discuss any questions you have with your health care provider.   Document Released: 11/21/2008 Document Revised: 10/15/2014 Document Reviewed: 04/21/2014 Elsevier Interactive Patient Education 2016 [employer]. Gastroesophageal Reflux Disease, Adult Normally, food travels down the esophagus and stays in the stomach to be digested. However, when a person has gastroesophageal reflux disease (GERD), food and stomach acid move back up into the esophagus. When this happens, the esophagus becomes sore and inflamed. Over time, GERD can create small holes (ulcers) in the lining of the esophagus.  CAUSES This condition is caused by a problem with the muscle between the esophagus and the stomach (lower esophageal sphincter, or LES). Normally, the LES muscle closes after food passes through the esophagus to the stomach. When the LES is weakened or abnormal, it does not close properly, and that allows food and stomach acid to go back up into the esophagus. The LES can be weakened by certain dietary substances, medicines, and medical conditions, including:  Tobacco use.  Pregnancy.  Having a hiatal hernia.  Heavy alcohol use.  Certain foods and beverages, such as coffee, chocolate, onions, and peppermint. RISK FACTORS This condition is more likely to develop in:  People who have an increased body weight.  People who have connective tissue disorders.  People who use NSAID medicines. SYMPTOMS Symptoms of this condition include:  Heartburn.  Difficult or painful swallowing.  The feeling of having a lump in the throat.  Abitter taste in the mouth.  Bad breath.  Having a large amount of saliva.  Having an upset or bloated stomach.  Belching.  Chest pain.  Shortness of breath or wheezing.  Ongoing (chronic) cough or a  night-time cough.  Wearing away of tooth enamel.  Weight loss. Different conditions can cause chest pain. Make sure to see your health care provider if you experience chest pain. DIAGNOSIS Your health care provider will take a medical history and perform a physical exam. To determine if you have mild or severe GERD, your health care provider may also monitor how you respond to treatment. You may also have other tests, including:  An endoscopy toexamine your stomach and esophagus with a small camera.  A test thatmeasures the acidity level in your esophagus.  A test thatmeasures how much pressure is on your esophagus.  A barium swallow or modified barium swallow to show the shape, size, and functioning of your esophagus. TREATMENT The goal of treatment is to help relieve your symptoms and to prevent complications. Treatment for this condition may vary depending on how severe your symptoms are. Your health care provider may recommend:  Changes to your diet.  Medicine.  Surgery. HOME CARE INSTRUCTIONS Diet  Follow a diet as recommended by your health care provider. This may involve avoiding foods and drinks such as:  Coffee and tea (with or without caffeine).  Drinks that containalcohol.  Energy drinks and sports drinks.  Carbonated drinks or sodas.  Chocolate and cocoa.  Peppermint and mint flavorings.  Garlic and onions.  Horseradish.  Spicy and acidic foods, including peppers, chili powder, curry powder, vinegar, hot sauces, and barbecue sauce.  Citrus fruit juices and citrus fruits, such as oranges, lemons, and limes.  Tomato-based foods, such as red sauce, chili, salsa, and pizza with red sauce.  Fried and fatty foods, such as donuts, french fries, potato chips, and high-fat dressings.  High-fat meats, such as hot dogs and fatty cuts of red and white meats, such as rib eye steak, sausage, ham, and bacon.  High-fat dairy items, such as whole milk, butter,  and cream cheese.  Eat small, frequent meals instead of large meals.  Avoid drinking large amounts of liquid with your meals.  Avoid eating meals during the 2-3 hours before bedtime.  Avoid lying down right after you eat.  Do not exercise right after you eat. General Instructions  Pay attention to any changes in your symptoms.  Take over-the-counter and prescription medicines only as told by your health care provider. Do not take aspirin, ibuprofen, or other NSAIDs unless your health care provider told you to do so.  Do not use any tobacco products, including cigarettes, chewing tobacco, and e-cigarettes. If you need help quitting, ask your health care provider.  Wear loose-fitting clothing. Do not wear anything tight around your waist that causes pressure on your abdomen.  Raise (elevate) the head of your bed 6 inches (15cm).  Try to reduce your stress, such as with yoga or meditation. If you need help reducing stress, ask your health care provider.  If you are overweight, reduce your weight to an amount that is healthy for you. Ask your health care provider for guidance about a safe weight loss goal.  Keep all follow-up visits as told by your health care provider. This is important. SEEK MEDICAL CARE IF:  You have new symptoms.  You have unexplained weight loss.  You have difficulty swallowing, or it hurts to swallow.  You have wheezing or a persistent cough.  Your symptoms do not improve with treatment.  You have a hoarse voice. SEEK IMMEDIATE MEDICAL CARE IF:  You have pain in your arms, neck, jaw, teeth, or back.  You feel sweaty, dizzy, or light-headed.  You have chest pain or shortness of breath.  You vomit and your vomit looks like blood or coffee grounds.  You faint.  Your stool is bloody or black.  You cannot swallow, drink, or eat.   This information is not intended to replace advice given to you by your health care provider. Make sure you discuss  any questions you have with your health care provider.   Document Released: 11/03/2004 Document Revised: 10/15/2014 Document Reviewed: 05/21/2014 Elsevier Interactive Patient Education [employer].

## 2015-05-25 NOTE — Interval H&P Note (Signed)
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 After consideration of risks, benefits and other options for treatment, the patient has consented to  Procedure(s) with comments: COLONOSCOPY WITH PROPOFOL (N/A) - 1015 - moved to 10:30 - office to notify ESOPHAGOGASTRODUODENOSCOPY (EGD) WITH PROPOFOL (N/A) ESOPHAGEAL DILATION (N/A) as a surgical intervention .  The patient's history has been reviewed, patient examined, no change in status, stable for surgery.  I have reviewed the patient's chart and labs.  Questions were answered to the patient's satisfaction.     Koren Sermersheim  No change. EGD with possible EGD and small bowel biopsy followed by colonoscopy per plan.  The risks, benefits, limitations, imponderables and alternatives regarding both EGD and colonoscopy have been reviewed with the patient. Questions have been answered. All parties agreeable.

## 2015-05-25 NOTE — Op Note (Signed)
[facility] Patient Name: Melissa Schmidt Procedure Date: 05/25/2015 9:21 AM MRN: [id number] Date of Birth: 1975-07-14 Attending MD: Gennette Pac , MD CSN: [id number] Age: 40 Admit Type: Outpatient Procedure:                Upper GI endoscopy Indications:              Dysphagia; Positive TTG antibody Providers:                Gennette Pac, MD, Brain Hilts, RN, Calton Dach, Technician Referring MD:              Medicines:                Monitored Anesthesia Care Complications:            No immediate complications. Estimated Blood Loss:     Estimated blood loss was minimal. Procedure:                Pre-Anesthesia Assessment:                           - Prior to the procedure, a History and Physical                            was performed, and patient medications and                            allergies were reviewed. The patient's tolerance of                            previous anesthesia was also reviewed. The risks                            and benefits of the procedure and the sedation                            options and risks were discussed with the patient.                            All questions were answered, and informed consent                            was obtained. Prior Anticoagulants: The patient has                            taken no previous anticoagulant or antiplatelet                            agents. ASA Grade Assessment: II - A patient with                            mild systemic disease. After reviewing the risks  and benefits, the patient was deemed in                            satisfactory condition to undergo the procedure.                           After obtaining informed consent, the endoscope was                            passed under direct vision. Throughout the                            procedure, the patient's blood pressure, pulse, and   oxygen saturations were monitored continuously. The                            EG-[id number] ([id number]) scope was introduced through the                            mouth, and advanced to the second part of duodenum.                            The upper GI endoscopy was accomplished without                            difficulty. The patient tolerated the procedure                            well. The patient tolerated the procedure well. Scope In: 9:46:17 AM Scope Out: 9:56:58 AM Total Procedure Duration: 0 hours 10 minutes 41 seconds  Findings:      The examined esophagus was normal.      Gastric mucosa well seen. Focal erosions along the distal greater       curvature. No ulcerative infiltrating process seen.. Stomach empty.       Intact and appropriately snug fundoplication.Patent pylorus.The third       portion of the duodenum was normal      The scope was withdrawn. Dilation was performed with a Maloney dilator       with mild resistance at 56 Fr. The dilation site was examined following       endoscope reinsertion and showed no change. Subsequently, biopsies of       the abnormal gastric mucosa as well as of the duodenum were taken with a       cold forceps for evaluation of celiac disease. Estimated blood loss was       minimal.      A few localized, 4 mm non-bleeding erosions were found on the greater       curvature of the stomach. Biopsies were taken with a cold forceps for       histology. Estimated blood loss was minimal. Impression:               - Normal esophagus. Dilated.                           - gastric erosions. Intact fundoplication..                           -  Normal third portion of the duodenum. Biopsied.                           - Non-bleeding erosive gastropathy. Biopsied. Moderate Sedation:      Moderate (conscious) sedation was personally administered by an       anesthesia professional. The following parameters were monitored: oxygen       saturation, heart  rate, blood pressure, respiratory rate, EKG, adequacy       of pulmonary ventilation, and response to care. Total physician       intraservice time was 45 minutes. Recommendation:           - Patient has a contact number available for                            emergencies. The signs and symptoms of potential                            delayed complications were discussed with the                            patient. Return to normal activities tomorrow.                            Written discharge instructions were provided to the                            patient.                           - Advance diet as tolerated.                           - Continue present medications.                           - Await pathology results.                           - Return to GI office in 6 weeks. Procedure Code(s):        --- Professional ---                           [phone], Esophagogastroduodenoscopy, flexible,                            transoral; with biopsy, single or multiple                           43450, Dilation of esophagus, by unguided sound or                            bougie, single or multiple passes Diagnosis Code(s):        --- Professional ---                           K31.89, Other diseases of stomach and duodenum  R13.10, Dysphagia, unspecified CPT copyright 2016 American Medical Association. All rights reserved. The codes documented in this report are preliminary and upon coder review may  be revised to meet current compliance requirements. Gerrit Friends. Courvoisier Hamblen, MD Gennette Pac, MD 05/25/2015 10:31:12 AM This report has been signed electronically. Number of Addenda: 0

## 2015-05-25 NOTE — Anesthesia Postprocedure Evaluation (Signed)
Anesthesia Post Note  Patient: Melissa Schmidt  Procedure(s) Performed: Procedure(s) (LRB): COLONOSCOPY WITH PROPOFOL (N/A) ESOPHAGOGASTRODUODENOSCOPY (EGD) WITH PROPOFOL (N/A) ESOPHAGEAL DILATION (N/A)  Patient location during evaluation: PACU Anesthesia Type: MAC Level of consciousness: awake and patient cooperative Pain management: pain level controlled Vital Signs Assessment: post-procedure vital signs reviewed and stable Respiratory status: spontaneous breathing, nonlabored ventilation and respiratory function stable Cardiovascular status: blood pressure returned to baseline Postop Assessment: no signs of nausea or vomiting Anesthetic complications: no    Last Vitals:  Filed Vitals:   05/25/15 0935 05/25/15 1023  BP: 149/99 125/80  Pulse:  97  Temp:  36.3 C  Resp: 19 19    Last Pain: There were no vitals filed for this visit.               Elliot Meldrum J

## 2015-05-25 NOTE — Transfer of Care (Signed)
Immediate Anesthesia Transfer of Care Note  Patient: Melissa Schmidt  Procedure(s) Performed: Procedure(s) with comments: COLONOSCOPY WITH PROPOFOL (N/A) - 1015 - moved to 10:30 - office to notify ESOPHAGOGASTRODUODENOSCOPY (EGD) WITH PROPOFOL (N/A) ESOPHAGEAL DILATION (N/A)  Patient Location: PACU  Anesthesia Type:MAC  Level of Consciousness: awake, alert , oriented and patient cooperative  Airway & Oxygen Therapy: Patient Spontanous Breathing and Patient connected to face mask oxygen  Post-op Assessment: Report given to RN, Post -op Vital signs reviewed and stable and Patient moving all extremities  Post vital signs: Reviewed and stable  Last Vitals:  Filed Vitals:   05/25/15 0930 05/25/15 0935  BP: 146/95 149/99  Pulse:    Temp:    Resp: 22 19    Complications: No apparent anesthesia complications

## 2015-05-25 NOTE — H&P (View-Only) (Signed)
Primary Care Physician: Andres Shad, MD  Primary Gastroenterologist:  Garfield Cornea, MD   Chief Complaint  Patient presents with  . Abdominal Pain    HPI: Melissa Schmidt is a 40 y.o. female here for follow-up. She is seen back on 03/19/2015 for right-sided abdominal pain. She has 2 different right-sided pains. One in the right lower quadrant worse with movement, unrelated to meals or bowel movements. This began in December 2016. She also has a colicky right upper quadrant pain for 4-5 years, worse with meals, especially greasy foods. Sometimes associated with diarrhea and nausea. No vomiting. History of Nissan fundoplication in 8338, last EGD done prior to the procedure. She also has intermittent bright red blood on the toilet tissue.  Retrieved records which showed elevated AST/ALT (minimal). Abdominal ultrasound from 2016 with fatty liver associated with mild hepatomegaly, gallbladder was normal. CT of the abdomen pelvis with contrast in December 2016 with fatty liver but no other abnormalities.  Bloodwork was done in February with negative ANA, mitochondrial and smooth muscle antibody, normal immunoglobulins, normal ceruloplasmin, ferritin, negative hepatitis B and C serologies. She did have a positive TTG.  She continues to have right lower quadrant abdominal pain which is worse with movement, cough. She continues to have right upper quadrant pain with radiation into the right upper back associated with nausea which occurs postprandially. Intermittent diarrhea. Intermittent bright red blood per rectum. Trial of NSAIDs did not help her musculoskeletal pain.   Current Outpatient Prescriptions  Medication Sig Dispense Refill  . metFORMIN (GLUCOPHAGE) 500 MG tablet Take 500 mg by mouth 2 (two) times daily with a meal.    . Multiple Vitamin (MULTIVITAMIN) capsule Take 1 capsule by mouth daily.    . Omega-3 Fatty Acids (OMEGA-3 FISH OIL PO) Take by mouth daily.     No current  facility-administered medications for this visit.    Allergies as of 04/29/2015  . (No Known Allergies)   Past Medical History  Diagnosis Date  . PCOS (polycystic ovarian syndrome)   . Borderline diabetes   . Hypertriglyceridemia    Past Surgical History  Procedure Laterality Date  . Appendectomy  2002  . Nissen fundoplication  2505   Family History  Problem Relation Age of Onset  . Irritable bowel syndrome Mother   . Inflammatory bowel disease Neg Hx   . Colon cancer Neg Hx   . Liver disease Neg Hx    Social History   Social History  . Marital Status: Unknown    Spouse Name: N/A  . Number of Children: 1  . Years of Education: N/A   Occupational History  . in school    Social History Main Topics  . Smoking status: Never Smoker   . Smokeless tobacco: None  . Alcohol Use: No  . Drug Use: No  . Sexual Activity: Not Asked   Other Topics Concern  . None   Social History Narrative     ROS:  General: Negative for anorexia, weight loss, fever, chills, fatigue, weakness. ENT: Negative for hoarseness, difficulty swallowing , nasal congestion. CV: Negative for chest pain, angina, palpitations, dyspnea on exertion, peripheral edema.  Respiratory: Negative for dyspnea at rest, dyspnea on exertion, cough, sputum, wheezing.  GI: See history of present illness. GU:  Negative for dysuria, hematuria, urinary incontinence, urinary frequency, nocturnal urination.  Endo: Negative for unusual weight change.    Physical Examination:   BP 147/93 mmHg  Pulse 92  Temp(Src) 98 F (36.7  C) (Oral)  Ht 5' 2"  (1.575 m)  Wt 321 lb 3.2 oz (145.695 kg)  BMI 58.73 kg/m2  General: Well-nourished, well-developed in no acute distress.  Eyes: No icterus. Mouth: Oropharyngeal mucosa moist and pink , no lesions erythema or exudate. Lungs: Clear to auscultation bilaterally.  Heart: Regular rate and rhythm, no murmurs rubs or gallops.  Abdomen: Bowel sounds are normal, nontender,  nondistended, no hepatosplenomegaly or masses, no abdominal bruits or hernia , no rebound or guarding.   Extremities: No lower extremity edema. No clubbing or deformities. Neuro: Alert and oriented x 4   Skin: Warm and dry, no jaundice.   Psych: Alert and cooperative, normal mood and affect.  Labs:   See above  Imaging Studies: No results found.  Impression/plan:  40 year old morbidly obese female who presents for follow-up of colicky right upper quadrant abdominal pain postprandially, right lower quadrant abdominal pain likely musculoskeletal, intermittent rectal bleeding/diarrhea. Noted to have fatty liver with mild hepatomegaly on imaging studies which led to lab workup. She was noted to have a positive TTG but other serologies unremarkable as outlined above. No mentioned above she does have some solid food dysphagia since her Nissen fundoplication over 10 years ago.   She needs upper endoscopy with small bowel biopsy for confirmation of celiac disease +/- esophageal dilation It is unclear that this is the etiology of her abdominal symptoms at this time. Based on findings, she may ultimately need to have completion of gallbladder workup. Also needs colonoscopy for intermittent bright red blood per rectum. Patient states she had difficult conscious sedation in the past. Plan on deep sedation in the OR given this history.  I have discussed the risks, alternatives, benefits with regards to but not limited to the risk of reaction to medication, bleeding, infection, perforation and the patient is agreeable to proceed. Written consent to be obtained.  Fatty liver. Instructions for fatty liver: Recommend 1-2# weight loss per week until ideal body weight through exercise & diet. Low fat/cholesterol diet.   Avoid sweets, sodas, fruit juices, sweetened beverages like tea, etc. Gradually increase exercise from 15 min daily up to 1 hr per day 5 days/week. Limit alcohol use.

## 2015-05-25 NOTE — Op Note (Signed)
[facility] Patient Name: Melissa Schmidt Procedure Date: 05/25/2015 10:02 AM MRN: [id number] Date of Birth: 1975/09/23 Attending MD: Gennette Pac , MD CSN: [id number] Age: 40 Admit Type: Outpatient Procedure:                Colonoscopy Indications:              Hematochezia Providers:                Gennette Pac, MD, Brain Hilts, RN, Calton Dach, Technician Referring MD:              Medicines:                Monitored Anesthesia Care Complications:            No immediate complications. Estimated Blood Loss:     Estimated blood loss: none. Procedure:                Pre-Anesthesia Assessment:                           - Prior to the procedure, a History and Physical                            was performed, and patient medications and                            allergies were reviewed. The patient's tolerance of                            previous anesthesia was also reviewed. The risks                            and benefits of the procedure and the sedation                            options and risks were discussed with the patient.                            All questions were answered, and informed consent                            was obtained. Prior Anticoagulants: The patient has                            taken no previous anticoagulant or antiplatelet                            agents. ASA Grade Assessment: II - A patient with                            mild systemic disease. After reviewing the risks                            and  benefits, the patient was deemed in                            satisfactory condition to undergo the procedure.                           - Prior to the procedure, a History and Physical                            was performed, and patient medications and                            allergies were reviewed. The patient's tolerance of                            previous anesthesia was also reviewed.  The risks                            and benefits of the procedure and the sedation                            options and risks were discussed with the patient.                            All questions were answered, and informed consent                            was obtained. Prior Anticoagulants: The patient has                            taken no previous anticoagulant or antiplatelet                            agents. ASA Grade Assessment: II - A patient with                            mild systemic disease. After reviewing the risks                            and benefits, the patient was deemed in                            satisfactory condition to undergo the procedure.                           After obtaining informed consent, the colonoscope                            was passed under direct vision. Throughout the                            procedure, the patient's blood pressure, pulse, and  oxygen saturations were monitored continuously. The                            EC-[id number] ([id number]) scope was introduced through                            the anus and advanced to the the cecum, identified                            by appendiceal orifice and ileocecal valve. The                            colonoscopy was performed without difficulty. The                            patient tolerated the procedure well. The quality                            of the bowel preparation was adequate. The                            ileocecal valve, appendiceal orifice, and rectum                            were photographed. Scope In: 10:03:08 AM Scope Out: 10:14:07 AM Scope Withdrawal Time: 0 hours 5 minutes 32 seconds  Total Procedure Duration: 0 hours 10 minutes 59 seconds  Findings:      Non-bleeding hemorrhoids were found during retroflexion. The hemorrhoids       were Grade II (internal hemorrhoids that prolapse but reduce       spontaneously). Estimated blood  loss: none.      The exam was otherwise without abnormality on direct and retroflexion       views. Impression:               - Non-bleeding hemorrhoids.                           - The examination was otherwise normal on direct                            and retroflexion views.                           - No specimens collected. Moderate Sedation:      Moderate (conscious) sedation was personally administered by an       anesthesia professional. The following parameters were monitored: oxygen       saturation, heart rate, blood pressure, respiratory rate, EKG, adequacy       of pulmonary ventilation, and response to care. Total physician       intraservice time was 45 minutes. Recommendation:           - Patient has a contact number available for                            emergencies. The signs and symptoms of potential  delayed complications were discussed with the                            patient. Return to normal activities tomorrow.                            Written discharge instructions were provided to the                            patient.                           - Resume previous diet.                           - Continue present medications.                           - Await pathology results.                           - Repeat colonoscopy in 10 years for screening                            purposes. Clinical appointment for hemorrhoid                            banding                           - Return to GI clinic in 6 months. See EGD report. Procedure Code(s):        --- Professional ---                           [phone], Colonoscopy, flexible; diagnostic, including                            collection of specimen(s) by brushing or washing,                            when performed (separate procedure) Diagnosis Code(s):        --- Professional ---                           K64.1, Second degree hemorrhoids                            K92.1, Melena (includes Hematochezia) CPT copyright 2016 American Medical Association. All rights reserved. The codes documented in this report are preliminary and upon coder review may  be revised to meet current compliance requirements. Gerrit Friends. Zoye Chandra, MD Gennette Pac, MD 05/25/2015 10:35:42 AM This report has been signed electronically. Number of Addenda: 0

## 2015-05-25 NOTE — Anesthesia Preprocedure Evaluation (Addendum)
Anesthesia Evaluation  Patient identified by MRN, date of birth, ID band Patient awake    Reviewed: Allergy & Precautions, NPO status , Patient's Chart, lab work & pertinent test results  Airway Mallampati: I  TM Distance: >3 FB Neck ROM: Full    Dental  (+) Partial Lower   Pulmonary neg pulmonary ROS,    breath sounds clear to auscultation       Cardiovascular hypertension (borderline), negative cardio ROS   Rhythm:Regular Rate:Normal     Neuro/Psych    GI/Hepatic abdominal pain, pos celiac screen   Endo/Other  Morbid obesity  Renal/GU      Musculoskeletal   Abdominal (+) + obese,   Peds  Hematology   Anesthesia Other Findings   Reproductive/Obstetrics                             Anesthesia Physical Anesthesia Plan  ASA: II  Anesthesia Plan: MAC   Post-op Pain Management:    Induction: Intravenous  Airway Management Planned: Simple Face Mask  Additional Equipment:   Intra-op Plan:   Post-operative Plan:   Informed Consent: I have reviewed the patients History and Physical, chart, labs and discussed the procedure including the risks, benefits and alternatives for the proposed anesthesia with the patient or authorized representative who has indicated his/her understanding and acceptance.     Plan Discussed with:   Anesthesia Plan Comments:         Anesthesia Quick Evaluation

## 2015-05-26 ENCOUNTER — Encounter: Payer: Self-pay | Admitting: "Endocrinology

## 2015-05-27 ENCOUNTER — Other Ambulatory Visit: Payer: Self-pay

## 2015-05-27 ENCOUNTER — Telehealth: Payer: Self-pay

## 2015-05-27 ENCOUNTER — Encounter: Payer: Self-pay | Admitting: Internal Medicine

## 2015-05-27 DIAGNOSIS — Z719 Counseling, unspecified: Secondary | ICD-10-CM

## 2015-05-27 NOTE — Telephone Encounter (Signed)
Per RMR-  Send letter to patient.  Send copy of letter with path to referring provider and PCP. Her elevated liver enzymes may be in part reflective of an autoimmune manifestation related to celiac disease. They need to be rechecked in several weeks. If The patient has not previously seen the dietitian, with arrange a referral for a gluten-free diet. Probably come back to see [place] in about 4 weeks if not already scheduled. Needs repeat LFTs at that time.

## 2015-05-27 NOTE — Telephone Encounter (Signed)
Letter mailed to the pt. 

## 2015-05-27 NOTE — Telephone Encounter (Signed)
OV made °

## 2015-05-27 NOTE — Telephone Encounter (Signed)
Referral sent to Nutrition

## 2015-05-28 ENCOUNTER — Encounter (HOSPITAL_COMMUNITY): Payer: Self-pay | Admitting: Internal Medicine

## 2015-05-28 NOTE — Telephone Encounter (Signed)
Sent pt a message via mychart.

## 2015-06-03 ENCOUNTER — Other Ambulatory Visit: Payer: Self-pay

## 2015-06-03 ENCOUNTER — Other Ambulatory Visit: Payer: Self-pay | Admitting: Internal Medicine

## 2015-06-03 DIAGNOSIS — K760 Fatty (change of) liver, not elsewhere classified: Secondary | ICD-10-CM

## 2015-06-12 ENCOUNTER — Encounter: Payer: Managed Care, Other (non HMO) | Attending: Internal Medicine | Admitting: Nutrition

## 2015-06-12 ENCOUNTER — Encounter: Payer: Self-pay | Admitting: Nutrition

## 2015-06-12 DIAGNOSIS — K760 Fatty (change of) liver, not elsewhere classified: Secondary | ICD-10-CM | POA: Insufficient documentation

## 2015-06-12 DIAGNOSIS — E118 Type 2 diabetes mellitus with unspecified complications: Secondary | ICD-10-CM

## 2015-06-12 DIAGNOSIS — Z719 Counseling, unspecified: Secondary | ICD-10-CM | POA: Insufficient documentation

## 2015-06-12 DIAGNOSIS — IMO0002 Reserved for concepts with insufficient information to code with codable children: Secondary | ICD-10-CM

## 2015-06-12 DIAGNOSIS — E1165 Type 2 diabetes mellitus with hyperglycemia: Secondary | ICD-10-CM

## 2015-06-12 DIAGNOSIS — K900 Celiac disease: Secondary | ICD-10-CM | POA: Insufficient documentation

## 2015-06-12 NOTE — Progress Notes (Signed)
  Medical Nutrition Therapy:  Appt start time: 1100 end time:  1200.  Assessment:  Primary concerns today:Celiac Disease, Diabetes Type 2 and Fatty  Liver and PCOS. Lives with her husband and daughter( 38 yrs old). She does the cooking and shopping. She is not working.  Most foods are baked or grilled. Eats 3 meals per day. Snacks sometimes of cereal. A1C 7.1%. Physical actvity: has membership at [employer] and is willing to start back exercising.. Needs to get meter to test blood sugars for her diabetes. Is on Metformin 1000 mg per day. Diet has been excessive in calories, fat, sodium and sugar and low in fresh fruits and vegetables. Willing to make changes with diet and exercise to provide weight loss and improved blood sugars and reduce fatty liver and PCOS problems.  Preferred Learning Style:   No preference indicated   Learning Readiness:  Ready  Change in progress   MEDICATIONS: see list   DIETARY INTAKE:   24-hr recall:  B ( AM): Skips often and did this am; or  Cherrios or corn chex with 1% milk or bacon 1-2 and eggs 2 , fruit; Sweet tea or milk Snk ( AM): none L ( PM):  Skipped or Snk ( PM):fruit or banana D ( PM): Hambuger steak, gravy baked beans, sweet tea Snk ( PM): cereal with milk sometimes. Beverages: Sweet tea, milk or water or gatorade  Usual physical activity: ADL  Estimated energy needs: 1500 calories 170 g carbohydrates 112 g protein 42 g fat  Progress Towards Goal(s):  In progress.   Nutritional Diagnosis:  NB-1.1 Food and nutrition-related knowledge deficit As related to Diabetes, Obesity and Fatty liver.  As evidenced by A1C 7.1%, BMI . 40 and Elevated liver enzymes..    Intervention:  Nutrition and Diabetes education provided on My Plate, CHO counting, meal planning, portion sizes, timing of meals, avoiding snacks between meals unless having a low blood sugar, target ranges for A1C and blood sugars, signs/symptoms and treatment of  hyper/hypoglycemia, monitoring blood sugars, taking medications as prescribed, benefits of exercising 30 minutes per day and prevention of complications of DM. Healthy weight loss tips and low fat low sodium high fiber diet. Celiac/gluten free diet, reading labels and focusing on fresh fruits and vegetables. Goals 1. Follow Gluten Free Plate MEthod 2.  Exercise daily for 30 minutes. 3. Cut out sweet tea 4. Increase fresh fruits and vegetables. 5. Drink 6 bottes of water per day 6. Avoid junk food and fast foods 7. Avoid gluten containing foods. 8. Lose 1-2 lbs per week. 9. Get A1C down to 6% or less.   Teaching Method Utilized:  Visual Auditory Hands on  Handouts given during visit include:  Meal Plan  Celiac Diet  Plate Method  Diabetes Instructions.   Barriers to learning/adherence to lifestyle change: None  Demonstrated degree of understanding via:  Teach Back   Monitoring/Evaluation:  Dietary intake, exercise, meal planning, SBG, and body weight in 1 month(s).

## 2015-06-12 NOTE — Patient Instructions (Signed)
Goals 1. Follow Gluten Free Plate MEthod 2.  Exercise daily for 30 minutes. 3. Cut out sweet tea 4. Increase fresh fruits and vegetables. 5. Drink 6 bottes of water per day 6. Avoid junk food and fast foods 7. Avoid gluten containing foods. 8. Lose 1-2 lbs per week. 9. Get A1C down to 6% or less.

## 2015-06-26 ENCOUNTER — Encounter: Payer: Managed Care, Other (non HMO) | Admitting: Internal Medicine

## 2015-07-13 ENCOUNTER — Ambulatory Visit: Payer: Managed Care, Other (non HMO) | Admitting: Nutrition

## 2015-07-15 ENCOUNTER — Telehealth: Payer: Self-pay | Admitting: Nutrition

## 2015-07-15 NOTE — Telephone Encounter (Signed)
Vm to call to reschedule missed appt.

## 2015-09-24 ENCOUNTER — Encounter: Payer: Self-pay | Admitting: "Endocrinology

## 2015-11-17 ENCOUNTER — Encounter: Payer: Managed Care, Other (non HMO) | Admitting: Internal Medicine

## 2015-12-01 ENCOUNTER — Encounter: Payer: Managed Care, Other (non HMO) | Admitting: Internal Medicine

## 2015-12-15 ENCOUNTER — Encounter: Payer: Managed Care, Other (non HMO) | Admitting: Internal Medicine

## 2016-05-25 ENCOUNTER — Encounter: Payer: Self-pay | Admitting: "Endocrinology

## 2016-12-13 ENCOUNTER — Encounter: Payer: Self-pay | Admitting: "Endocrinology

## 2016-12-13 LAB — LIPID PANEL
Cholesterol: 150 (ref 0–200)
HDL: 41 (ref 35–70)
LDL Cholesterol: 75
Triglycerides: 172 — AB (ref 40–160)

## 2017-04-07 HISTORY — PX: ESOPHAGOGASTRODUODENOSCOPY: SHX1529

## 2017-05-04 ENCOUNTER — Encounter: Payer: Self-pay | Admitting: "Endocrinology

## 2017-07-31 ENCOUNTER — Encounter: Payer: Self-pay | Admitting: "Endocrinology

## 2017-07-31 LAB — HEMOGLOBIN A1C: Hemoglobin A1C: 5.9

## 2017-07-31 LAB — TSH: TSH: 0.01 — AB (ref 0.41–5.90)

## 2017-09-04 ENCOUNTER — Ambulatory Visit: Payer: Self-pay | Admitting: "Endocrinology

## 2017-10-10 ENCOUNTER — Ambulatory Visit (INDEPENDENT_AMBULATORY_CARE_PROVIDER_SITE_OTHER): Payer: BLUE CROSS/BLUE SHIELD | Admitting: "Endocrinology

## 2017-10-10 ENCOUNTER — Encounter: Payer: Self-pay | Admitting: "Endocrinology

## 2017-10-10 VITALS — BP 150/80 | HR 94 | Ht 62.0 in | Wt 280.0 lb

## 2017-10-10 DIAGNOSIS — E0590 Thyrotoxicosis, unspecified without thyrotoxic crisis or storm: Secondary | ICD-10-CM | POA: Diagnosis not present

## 2017-10-10 DIAGNOSIS — E1165 Type 2 diabetes mellitus with hyperglycemia: Secondary | ICD-10-CM

## 2017-10-10 MED ORDER — METHIMAZOLE 5 MG PO TABS: 10.0000 mg | ORAL_TABLET | Freq: Two times a day (BID) | ORAL | 2 refills | Status: DC

## 2017-10-10 MED ORDER — PROPRANOLOL HCL 20 MG PO TABS: 20.0000 mg | ORAL_TABLET | Freq: Three times a day (TID) | ORAL | 2 refills | Status: DC

## 2017-10-10 NOTE — Progress Notes (Signed)
Endocrinology Consult Note    Subjective:    Patient ID: Melissa Schmidt, female    DOB: 1976-01-22, PCP Andres Shad, MD.   Past Medical History:  Diagnosis Date  . Arthritis   . Borderline diabetes   . Celiac disease   . Complication of anesthesia    shaking post anesthesia  . Diabetes mellitus without complication ([place])   . Hemorrhoids   . Hypertriglyceridemia   . PCOS (polycystic ovarian syndrome)    Past Surgical History:  Procedure Laterality Date  . APPENDECTOMY  2002  . COLONOSCOPY WITH PROPOFOL N/A 05/25/2015   Dr.Rourk- non-bleeding hemorrhoids, the examination was o/w normal  . ESOPHAGEAL DILATION N/A 05/25/2015   Procedure: ESOPHAGEAL DILATION;  Surgeon: Daneil Dolin, MD;  [facility]: [facility];  Service: Endoscopy;  Laterality: N/A;  . ESOPHAGOGASTRODUODENOSCOPY (EGD) WITH PROPOFOL N/A 05/25/2015   Dr.Rourk- normal esophagus, dilated, gastric erosions, intact fundoplication, normal third portion of the duodenum, non-bleeding erosive gastropathy. duodenum bx= benign small bowel mucosa with patchy increased intraepithelial lymphocytes, stomach bx= erosive gastritis with reactive changes  . NISSEN FUNDOPLICATION  6811   Social History   Socioeconomic History  . Marital status: Married    Spouse name: Not on file  . Number of children: 1  . Years of education: Not on file  . Highest education level: Not on file  Occupational History  . Occupation: in school  Social Needs  . Financial resource strain: Not on file  . Food insecurity:    Worry: Not on file    Inability: Not on file  . Transportation needs:    Medical: Not on file    Non-medical: Not on file  Tobacco Use  . Smoking status: Never Smoker  . Smokeless tobacco: Never Used  Substance and Sexual Activity  . Alcohol use: No    Alcohol/week: 0.0 standard drinks  . Drug use: No  . Sexual activity: Yes    Birth control/protection: None  Lifestyle  . Physical activity:    Days per  week: Not on file    Minutes per session: Not on file  . Stress: Not on file  Relationships  . Social connections:    Talks on phone: Not on file    Gets together: Not on file    Attends religious service: Not on file    Active member of club or organization: Not on file    Attends meetings of clubs or organizations: Not on file    Relationship status: Not on file  Other Topics Concern  . Not on file  Social History Narrative  . Not on file   Outpatient Encounter Medications as of 10/10/2017  Medication Sig  . Ascorbic Acid (VITAMIN C ADULT GUMMIES PO) Take by mouth daily.  . ferrous sulfate 325 (65 FE) MG EC tablet Take 325 mg by mouth daily with breakfast.  . fluticasone (FLONASE) 50 MCG/ACT nasal spray Place 2 sprays into both nostrils daily.  . metFORMIN (GLUCOPHAGE) 1000 MG tablet Take 1,000 mg by mouth 2 (two) times daily with a meal.   . Multiple Vitamin (MULTIVITAMIN) capsule Take 1 capsule by mouth daily.  . norethindrone (MICRONOR,CAMILA,ERRIN) 0.35 MG tablet Take 1 tablet by mouth daily.  . Omega-3 Fatty Acids (OMEGA-3 FISH OIL PO) Take by mouth 3 (three) times daily.   . methimazole (TAPAZOLE) 5 MG tablet Take 2 tablets (10 mg total) by mouth 2 (two) times daily with a meal.  . omeprazole (PRILOSEC) 20 MG capsule Take  20 mg by mouth daily.  . polyethylene glycol-electrolytes (NULYTELY/GOLYTELY) 420 g solution Take 4,000 mLs by mouth once.  . propranolol (INDERAL) 20 MG tablet Take 1 tablet (20 mg total) by mouth 3 (three) times daily.  . [DISCONTINUED] methimazole (TAPAZOLE) 5 MG tablet Take 5 mg by mouth 3 (three) times daily.   No facility-administered encounter medications on file as of 10/10/2017.     ALLERGIES: No Known Allergies  VACCINATION STATUS:  There is no immunization history on file for this patient.   HPI  Melissa Schmidt is 42 y.o. female who presents today with a medical history as above. Melissa Schmidt is being seen in consultation for hyperthyroidism requested  by Andres Shad, MD.  Melissa Schmidt has been dealing with symptoms of weight loss of 50 pounds over 62-month palpitations, heat intolerance/sweating, tremors, sleep disturbance, irritability for total of 639-month her most recent thyroid labs revealed elevated total T4 of 15.1, associated with undetectable TSH. Lab Results  Component Value Date   TSH 0.01 (A) 07/31/2017   . Melissa Schmidt denies dysphagia, choking, shortness of breath, no recent voice change. These symptoms are progressively worsening and troubling to her.   Melissa Schmidt denies family history of thyroid dysfunction nor any thyroid cancer.   Melissa Schmidt was initiated on methimazole 5 mg p.o. 3 times daily, with some symptomatic relief.  Melissa Schmidt  is willing to proceed with appropriate work up and therapy for thyrotoxicosis.                           Review of systems  Constitutional: + weight loss, + fatigue, + subjective hyperthermia Eyes: no blurry vision, - xerophthalmia ENT: no sore throat, no nodules palpated in throat, no dysphagia/odynophagia, nor hoarseness Cardiovascular: no Chest Pain, no Shortness of Breath, ++  palpitations, no leg swelling Respiratory: no cough, no SOB Gastrointestinal: no Nausea, no Vomiting, no Diarhhea Musculoskeletal: no muscle/joint aches Skin: no rashes Neurological: ++  tremors, no numbness, no tingling, no dizziness Psychiatric: no depression, ++  anxiety   Objective:    BP (!) 150/80   Pulse 94   Ht 5' 2"  (1.575 m)   Wt 280 lb (127 kg)   BMI 51.21 kg/m   Wt Readings from Last 3 Encounters:  10/10/17 280 lb (127 kg)  06/12/15 (!) 318 lb (144.2 kg)  05/25/15 (!) 322 lb (146.1 kg)                                                Physical exam  Constitutional:  +Obese, anxious state of mind.  Eyes: PERRLA, EOMI, - exophthalmos ENT: moist mucous membranes, ++  thyromegaly, no cervical lymphadenopathy Cardiovascular: ++ Hyperactive precordium , + mildly tachycardic, no Murmur/Rubs/Gallops Respiratory:   adequate breathing efforts, no gross chest deformity, Clear to auscultation bilaterally Gastrointestinal: abdomen soft, Non -tender, No distension, Bowel Sounds present Musculoskeletal: no gross deformities, strength intact in all four extremities Skin: moist, warm, no rashes Neurological:  +++  tremor with outstretched hands,  ++ risk DTR on bilateral lower extremities.     CMP     Component Value Date/Time   NA 141 05/21/2015 1010   K 4.2 05/21/2015 1010   CL 104 05/21/2015 1010   CO2 27 05/21/2015 1010   GLUCOSE 163 (H) 05/21/2015 1010   BUN 13 05/21/2015 1010  CREATININE 0.79 05/21/2015 1010   CALCIUM 9.4 05/21/2015 1010   GFRNONAA >60 05/21/2015 1010   GFRAA >60 05/21/2015 1010     CBC    Component Value Date/Time   WBC 9.6 05/21/2015 1010   RBC 4.41 05/21/2015 1010   HGB 11.2 (L) 05/21/2015 1010   HCT 35.6 (L) 05/21/2015 1010   PLT 326 05/21/2015 1010   MCV 80.7 05/21/2015 1010   MCH 25.4 (L) 05/21/2015 1010   MCHC 31.5 05/21/2015 1010   RDW 15.0 05/21/2015 1010     Diabetic Labs (most recent): Lab Results  Component Value Date   HGBA1C 5.9 07/31/2017    Lipid Panel     Component Value Date/Time   CHOL 150 12/13/2016   TRIG 172 (A) 12/13/2016   HDL 41 12/13/2016   LDLCALC 75 12/13/2016     Lab Results  Component Value Date   TSH 0.01 (A) 07/31/2017       Assessment & Plan:   1. Hyperthyroidism Melissa Schmidt is being seen at a kind request of Andres Shad, MD. her history and most recent labs are reviewed, and Melissa Schmidt was examined clinically. Subjective and objective findings are consistent with thyrotoxicosis likely from primary hyperthyroidism. The potential risks of untreated thyrotoxicosis and the need for definitive therapy have been discussed in detail with her, and Melissa Schmidt agrees to proceed with diagnostic workup and treatment plan.  -Options of therapy are discussed with her. - Melissa Schmidt will  benefit from ablative treatment with I-131 on the  long-term.  However,  given her current presentation with significant symptoms, Melissa Schmidt will continue to benefit from treatment with even higher dose of methimazole to achieve safe level of thyroid hormone before stopping methimazole for thyroid uptake and scan to prepare her for I-131 therapy.  -I discussed and increased methimazole to 10 mg p.o. twice daily, discussed and added propranolol 20 mg p.o. 3 times daily with plan to repeat thyroid function tests in 7 weeks with clinic visit.  -Regarding her type 2 diabetes, her A1c is 5.9%.  Melissa Schmidt is advised to continue metformin 1000 mg p.o. twice daily. -Unintentional loss of 50 pounds might have contributed to improved A1c. - I advised her to maintain close follow up with Andres Shad, MD for primary care needs.   - Time spent with the patient: 45 minutes, of which >50% was spent in obtaining information about her symptoms, reviewing her previous labs, evaluations, and treatments, counseling her about her primary hyper thyroidism, type 2 diabetes, obesity and developing a plan to confirm the diagnosis and long term treatment as necessary.  Nalini Alcaraz Kittell participated in the discussions, expressed understanding, and voiced agreement with the above plans.  All questions were answered to her satisfaction. Melissa Schmidt is encouraged to contact clinic should Melissa Schmidt have any questions or concerns prior to her return visit.  Follow up plan: Return in about 8 weeks (around 12/05/2017) for Follow up with Pre-visit Labs.   Thank you for involving me in the care of this pleasant patient, and I will continue to update you with her progress.  Glade Lloyd, MD [facility] [place] [facility] Phone: [phone]  Fax: [phone]   10/10/2017, 2:07 PM  This note was partially dictated with voice recognition software. Similar sounding words can be transcribed inadequately or may not  be corrected upon review.

## 2017-10-10 NOTE — Patient Instructions (Signed)

## 2017-11-29 ENCOUNTER — Other Ambulatory Visit: Payer: Self-pay | Admitting: "Endocrinology

## 2017-11-30 LAB — TSH: TSH: <0.006 u[IU]/mL — ABNORMAL LOW (ref 0.450–4.500)

## 2017-11-30 LAB — THYROID PEROXIDASE ANTIBODY: Thyroperoxidase Ab SerPl-aCnc: 91 IU/mL — ABNORMAL HIGH (ref 0–34)

## 2017-11-30 LAB — THYROGLOBULIN ANTIBODY: Thyroglobulin Antibody: 18.8 IU/mL — ABNORMAL HIGH (ref 0.0–0.9)

## 2017-11-30 LAB — THYROID STIMULATING IMMUNOGLOBULIN: Thyroid Stim Immunoglobulin: 33 IU/L — ABNORMAL HIGH (ref 0.00–0.55)

## 2017-11-30 LAB — T4, FREE: Free T4: 3.61 ng/dL — ABNORMAL HIGH (ref 0.82–1.77)

## 2017-11-30 LAB — T3, FREE: T3, Free: 11 pg/mL — ABNORMAL HIGH (ref 2.0–4.4)

## 2017-12-05 ENCOUNTER — Encounter: Payer: Self-pay | Admitting: "Endocrinology

## 2017-12-05 ENCOUNTER — Ambulatory Visit (INDEPENDENT_AMBULATORY_CARE_PROVIDER_SITE_OTHER): Payer: BLUE CROSS/BLUE SHIELD | Admitting: "Endocrinology

## 2017-12-05 VITALS — BP 127/85 | HR 80 | Ht 62.0 in | Wt 275.0 lb

## 2017-12-05 DIAGNOSIS — E0590 Thyrotoxicosis, unspecified without thyrotoxic crisis or storm: Secondary | ICD-10-CM

## 2017-12-05 MED ORDER — PROPRANOLOL HCL 20 MG PO TABS: 20.0000 mg | ORAL_TABLET | Freq: Two times a day (BID) | ORAL | 2 refills | Status: DC

## 2017-12-05 MED ORDER — METHIMAZOLE 10 MG PO TABS: 10.0000 mg | ORAL_TABLET | Freq: Three times a day (TID) | ORAL | 1 refills | Status: DC

## 2017-12-05 NOTE — Progress Notes (Signed)
Endocrinology follow-up Note    Subjective:    Patient ID: Melissa Schmidt, female    DOB: 1975-04-23, PCP Andres Shad, MD.   Past Medical History:  Diagnosis Date  . Arthritis   . Borderline diabetes   . Celiac disease   . Complication of anesthesia    shaking post anesthesia  . Diabetes mellitus without complication ([place])   . Hemorrhoids   . Hypertriglyceridemia   . PCOS (polycystic ovarian syndrome)    Past Surgical History:  Procedure Laterality Date  . APPENDECTOMY  2002  . COLONOSCOPY WITH PROPOFOL N/A 05/25/2015   Dr.Rourk- non-bleeding hemorrhoids, the examination was o/w normal  . ESOPHAGEAL DILATION N/A 05/25/2015   Procedure: ESOPHAGEAL DILATION;  Surgeon: Daneil Dolin, MD;  Location: [facility];  Service: Endoscopy;  Laterality: N/A;  . ESOPHAGOGASTRODUODENOSCOPY (EGD) WITH PROPOFOL N/A 05/25/2015   Dr.Rourk- normal esophagus, dilated, gastric erosions, intact fundoplication, normal third portion of the duodenum, non-bleeding erosive gastropathy. duodenum bx= benign small bowel mucosa with patchy increased intraepithelial lymphocytes, stomach bx= erosive gastritis with reactive changes  . NISSEN FUNDOPLICATION  6045   Social History   Socioeconomic History  . Marital status: Married    Spouse name: Not on file  . Number of children: 1  . Years of education: Not on file  . Highest education level: Not on file  Occupational History  . Occupation: in school  Social Needs  . Financial resource strain: Not on file  . Food insecurity:    Worry: Not on file    Inability: Not on file  . Transportation needs:    Medical: Not on file    Non-medical: Not on file  Tobacco Use  . Smoking status: Never Smoker  . Smokeless tobacco: Never Used  Substance and Sexual Activity  . Alcohol use: No    Alcohol/week: 0.0 standard drinks  . Drug use: No  . Sexual activity: Yes    Birth control/protection: None  Lifestyle  . Physical activity:    Days per  week: Not on file    Minutes per session: Not on file  . Stress: Not on file  Relationships  . Social connections:    Talks on phone: Not on file    Gets together: Not on file    Attends religious service: Not on file    Active member of club or organization: Not on file    Attends meetings of clubs or organizations: Not on file    Relationship status: Not on file  Other Topics Concern  . Not on file  Social History Narrative  . Not on file   Outpatient Encounter Medications as of 12/05/2017  Medication Sig  . Ascorbic Acid (VITAMIN C ADULT GUMMIES PO) Take by mouth daily.  . ferrous sulfate 325 (65 FE) MG EC tablet Take 325 mg by mouth daily with breakfast.  . fluticasone (FLONASE) 50 MCG/ACT nasal spray Place 2 sprays into both nostrils daily.  . metFORMIN (GLUCOPHAGE) 1000 MG tablet Take 1,000 mg by mouth 2 (two) times daily with a meal.   . methimazole (TAPAZOLE) 10 MG tablet Take 1 tablet (10 mg total) by mouth 3 (three) times daily.  . Multiple Vitamin (MULTIVITAMIN) capsule Take 1 capsule by mouth daily.  . norethindrone (MICRONOR,CAMILA,ERRIN) 0.35 MG tablet Take 1 tablet by mouth daily.  . Omega-3 Fatty Acids (OMEGA-3 FISH OIL PO) Take by mouth 3 (three) times daily.   Marland Kitchen omeprazole (PRILOSEC) 20 MG capsule Take 20 mg by  mouth daily.  . polyethylene glycol-electrolytes (NULYTELY/GOLYTELY) 420 g solution Take 4,000 mLs by mouth once.  . propranolol (INDERAL) 20 MG tablet Take 1 tablet (20 mg total) by mouth 2 (two) times daily.  . [DISCONTINUED] methimazole (TAPAZOLE) 5 MG tablet Take 2 tablets (10 mg total) by mouth 2 (two) times daily with a meal.  . [DISCONTINUED] propranolol (INDERAL) 20 MG tablet Take 1 tablet (20 mg total) by mouth 3 (three) times daily.   No facility-administered encounter medications on file as of 12/05/2017.     ALLERGIES: No Known Allergies  VACCINATION STATUS:  There is no immunization history on file for this patient.   HPI  Melissa Schmidt  is 42 y.o. female who presents today with a medical history as above. she is being seen in follow-up  for hyperthyroidism requested by Andres Shad, MD.   Is currently on methimazole 10 mg p.o. twice daily and propanolol 20 mg p.o. 3 times daily for severe and symptomatic hyperthyroidism as a bridging treatment. she has been dealing with symptoms of weight loss of 65 pounds over 82-month palpitations, heat intolerance/sweating, tremors, sleep disturbance, irritability for total of 641-month His symptoms have largely improved and patient reports feeling better this morning.  He lost 15 more pounds since last visit.  she reports occasional dysphagia, denies shortness of breath, nor voice change.   she denies family history of thyroid dysfunction nor any thyroid cancer.   she  is willing to proceed with appropriate work up and therapy for thyrotoxicosis.                           Review of systems  Constitutional: + weight loss, + fatigue, + subjective hyperthermia Eyes: no blurry vision, - xerophthalmia ENT: no sore throat, no nodules palpated in throat, no dysphagia/odynophagia, nor hoarseness Cardiovascular: no Chest Pain, no Shortness of Breath, -  palpitations, no leg swelling Respiratory: no cough, no SOB Gastrointestinal: no Nausea, no Vomiting, no Diarhhea Musculoskeletal: no muscle/joint aches Skin: no rashes Neurological: +  tremors, no numbness, no tingling, no dizziness Psychiatric: no depression, +  anxiety   Objective:    BP 127/85   Pulse 80   Ht 5' 2"  (1.575 m)   Wt 275 lb (124.7 kg)   BMI 50.30 kg/m   Wt Readings from Last 3 Encounters:  12/05/17 275 lb (124.7 kg)  10/10/17 280 lb (127 kg)  06/12/15 (!) 318 lb (144.2 kg)                                                Physical exam  Constitutional:  +Obese, anxious state of mind.  Eyes: PERRLA, EOMI, - exophthalmos ENT: moist mucous membranes, +  thyromegaly, no cervical lymphadenopathy Cardiovascular:  + Normal precordium, regular rate and rhythm.   Respiratory:  adequate breathing efforts, no gross chest deformity, Clear to auscultation bilaterally Gastrointestinal: abdomen soft, Non -tender, No distension, Bowel Sounds present Musculoskeletal: no gross deformities, strength intact in all four extremities Skin: moist, warm, no rashes Neurological: +  tremor with outstretched hands,  + risk DTR on bilateral lower extremities.     CMP     Component Value Date/Time   NA 141 05/21/2015 1010   K 4.2 05/21/2015 1010   CL 104 05/21/2015 1010   CO2 27 05/21/2015  1010   GLUCOSE 163 (H) 05/21/2015 1010   BUN 13 05/21/2015 1010   CREATININE 0.79 05/21/2015 1010   CALCIUM 9.4 05/21/2015 1010   GFRNONAA >60 05/21/2015 1010   GFRAA >60 05/21/2015 1010     CBC    Component Value Date/Time   WBC 9.6 05/21/2015 1010   RBC 4.41 05/21/2015 1010   HGB 11.2 (L) 05/21/2015 1010   HCT 35.6 (L) 05/21/2015 1010   PLT 326 05/21/2015 1010   MCV 80.7 05/21/2015 1010   MCH 25.4 (L) 05/21/2015 1010   MCHC 31.5 05/21/2015 1010   RDW 15.0 05/21/2015 1010     Diabetic Labs (most recent): Lab Results  Component Value Date   HGBA1C 5.9 07/31/2017    Lipid Panel     Component Value Date/Time   CHOL 150 12/13/2016   TRIG 172 (A) 12/13/2016   HDL 41 12/13/2016   LDLCALC 75 12/13/2016     Lab Results  Component Value Date   TSH <0.006 (L) 11/29/2017   TSH 0.01 (A) 07/31/2017   FREET4 3.61 (H) 11/29/2017       Assessment & Plan:   1. Hyperthyroidism 2.  Antithyroid autoantibodies -She is responding to antithyroid medications with clinical and biochemical progress.  Labs indicate a possibility of Graves' disease as a cause of primary hyperthyroidism.  The potential risks of untreated thyrotoxicosis and the need for definitive therapy have been discussed in detail with her, and she agrees to proceed with diagnostic workup and treatment plan. -She will eventually require thyroid  ablation with I-131 preceded by thyroid uptake and scan.  However,  she is still clinically hyperthyroid and not ready to discontinue methimazole for the purpose of doing 24-hour thyroid uptake and scan.  -I advised her to increase methimazole to 10 mg p.o. 3 times daily, continue propranolol 20 mg p.o. twice daily and repeat thyroid function tests in 8 weeks and office visit in 9 weeks. -If by her next visit thyroid is adequately controlled, methimazole treatment would be discontinued for 5 days to perform thyroid uptake and scan.   -Regarding her type 2 diabetes, her A1c is 5.9%.  She is advised to continue metformin 1000 mg p.o. twice daily. -Unintentional loss of 65 pounds might have contributed to improved A1c. - I advised her to maintain close follow up with Andres Shad, MD for primary care needs.    Follow up plan: Return in about 9 weeks (around 02/06/2018) for Follow up with Pre-visit Labs.   Thank you for involving me in the care of this pleasant patient, and I will continue to update you with her progress.  Glade Lloyd, MD [facility]ocrin[facility] Phone: [phone]  Fax: [phone]   12/05/2017, 10:21 AM  This note was partially dictated with voice recognition software. Similar sounding words can be transcribed inadequately or may not  be corrected upon review.

## 2018-01-29 ENCOUNTER — Other Ambulatory Visit: Payer: Self-pay | Admitting: "Endocrinology

## 2018-01-30 LAB — T4, FREE: Free T4: 1.99 ng/dL — ABNORMAL HIGH (ref 0.82–1.77)

## 2018-01-30 LAB — T3, FREE: T3, Free: 6.6 pg/mL — ABNORMAL HIGH (ref 2.0–4.4)

## 2018-01-30 LAB — TSH: TSH: 0.007 u[IU]/mL — ABNORMAL LOW (ref 0.450–4.500)

## 2018-02-06 ENCOUNTER — Ambulatory Visit: Payer: BLUE CROSS/BLUE SHIELD | Admitting: "Endocrinology

## 2018-03-05 ENCOUNTER — Ambulatory Visit: Payer: BLUE CROSS/BLUE SHIELD | Admitting: "Endocrinology

## 2018-03-19 ENCOUNTER — Encounter: Payer: Self-pay | Admitting: "Endocrinology

## 2018-03-19 ENCOUNTER — Ambulatory Visit (INDEPENDENT_AMBULATORY_CARE_PROVIDER_SITE_OTHER): Payer: BLUE CROSS/BLUE SHIELD | Admitting: "Endocrinology

## 2018-03-19 VITALS — BP 133/80 | HR 67 | Ht 62.0 in | Wt 281.0 lb

## 2018-03-19 DIAGNOSIS — E0590 Thyrotoxicosis, unspecified without thyrotoxic crisis or storm: Secondary | ICD-10-CM | POA: Diagnosis not present

## 2018-03-19 MED ORDER — METHIMAZOLE 10 MG PO TABS: 10.0000 mg | ORAL_TABLET | Freq: Two times a day (BID) | ORAL | 3 refills | Status: DC

## 2018-03-19 NOTE — Progress Notes (Signed)
LeighAnn Khalik Pewitt, CMA  

## 2018-03-19 NOTE — Progress Notes (Signed)
Endocrinology follow-up Note    Subjective:    Patient ID: Melissa Schmidt, female    DOB: 1975/12/11, PCP Andres Shad, MD.   Past Medical History:  Diagnosis Date  . Arthritis   . Borderline diabetes   . Celiac disease   . Complication of anesthesia    shaking post anesthesia  . Diabetes mellitus without complication ([place])   . Hemorrhoids   . Hypertriglyceridemia   . PCOS (polycystic ovarian syndrome)    Past Surgical History:  Procedure Laterality Date  . APPENDECTOMY  2002  . COLONOSCOPY WITH PROPOFOL N/A 05/25/2015   Dr.Rourk- non-bleeding hemorrhoids, the examination was o/w normal  . ESOPHAGEAL DILATION N/A 05/25/2015   Procedure: ESOPHAGEAL DILATION;  Surgeon: Daneil Dolin, MD;  Location: [facility];  Service: Endoscopy;  Laterality: N/A;  . ESOPHAGOGASTRODUODENOSCOPY (EGD) WITH PROPOFOL N/A 05/25/2015   Dr.Rourk- normal esophagus, dilated, gastric erosions, intact fundoplication, normal third portion of the duodenum, non-bleeding erosive gastropathy. duodenum bx= benign small bowel mucosa with patchy increased intraepithelial lymphocytes, stomach bx= erosive gastritis with reactive changes  . NISSEN FUNDOPLICATION  1962   Social History   Socioeconomic History  . Marital status: Married    Spouse name: Not on file  . Number of children: 1  . Years of education: Not on file  . Highest education level: Not on file  Occupational History  . Occupation: in school  Social Needs  . Financial resource strain: Not on file  . Food insecurity:    Worry: Not on file    Inability: Not on file  . Transportation needs:    Medical: Not on file    Non-medical: Not on file  Tobacco Use  . Smoking status: Never Smoker  . Smokeless tobacco: Never Used  Substance and Sexual Activity  . Alcohol use: No    Alcohol/week: 0.0 standard drinks  . Drug use: No  . Sexual activity: Yes    Birth control/protection: None  Lifestyle  . Physical activity:    Days per  week: Not on file    Minutes per session: Not on file  . Stress: Not on file  Relationships  . Social connections:    Talks on phone: Not on file    Gets together: Not on file    Attends religious service: Not on file    Active member of club or organization: Not on file    Attends meetings of clubs or organizations: Not on file    Relationship status: Not on file  Other Topics Concern  . Not on file  Social History Narrative  . Not on file   Outpatient Encounter Medications as of 03/19/2018  Medication Sig  . Ascorbic Acid (VITAMIN C ADULT GUMMIES PO) Take by mouth daily.  . ferrous sulfate 325 (65 FE) MG EC tablet Take 325 mg by mouth daily with breakfast.  . fluticasone (FLONASE) 50 MCG/ACT nasal spray Place 2 sprays into both nostrils daily.  . metFORMIN (GLUCOPHAGE) 1000 MG tablet Take 1,000 mg by mouth 2 (two) times daily with a meal.   . methimazole (TAPAZOLE) 10 MG tablet Take 1 tablet (10 mg total) by mouth 2 (two) times daily.  . Multiple Vitamin (MULTIVITAMIN) capsule Take 1 capsule by mouth daily.  . norethindrone (MICRONOR,CAMILA,ERRIN) 0.35 MG tablet Take 1 tablet by mouth daily.  . Omega-3 Fatty Acids (OMEGA-3 FISH OIL PO) Take by mouth 3 (three) times daily.   Marland Kitchen omeprazole (PRILOSEC) 20 MG capsule Take 20 mg by  mouth daily.  . propranolol (INDERAL) 20 MG tablet Take 1 tablet (20 mg total) by mouth 2 (two) times daily.  . [DISCONTINUED] methimazole (TAPAZOLE) 10 MG tablet Take 1 tablet (10 mg total) by mouth 3 (three) times daily.  . [DISCONTINUED] polyethylene glycol-electrolytes (NULYTELY/GOLYTELY) 420 g solution Take 4,000 mLs by mouth once.   No facility-administered encounter medications on file as of 03/19/2018.     ALLERGIES: No Known Allergies  VACCINATION STATUS:  There is no immunization history on file for this patient.   HPI  Melissa Schmidt is 43 y.o. female who presents today with a medical history as above. she is being seen in follow-up  for  hyperthyroidism requested by Andres Shad, MD.   -She is currently on methimazole 10 mg p.o. 3 times daily and propanolol 20 mg p.o. twice daily for severe and symptomatic hyperthyroidism as a bridging treatment.    She continues to feel better, regaining some of the weight she lost-she lost 65 pounds over 6 months prior to initiation of treatment.  She has no new complaints today.  She continues to feel better. she reports occasional dysphagia, denies shortness of breath, nor voice change.   she denies family history of thyroid dysfunction nor any thyroid cancer.   she  is willing to proceed with appropriate work up and therapy for thyrotoxicosis.                           Review of systems  Constitutional: + Steady weight since last visit, -fatigue, + subjective hyperthermia Eyes: no blurry vision, - xerophthalmia ENT: no sore throat, no nodules palpated in throat, no dysphagia/odynophagia, nor hoarseness Cardiovascular: no Chest Pain, no Shortness of Breath, -  palpitations, no leg swelling Respiratory: no cough, no SOB Gastrointestinal: no Nausea, no Vomiting, no Diarhhea Musculoskeletal: no muscle/joint aches Skin: no rashes Neurological: -  tremors, no numbness, no tingling, no dizziness Psychiatric: no depression, +  anxiety   Objective:    BP 133/80   Pulse 67   Ht 5' 2"  (1.575 m)   Wt 281 lb (127.5 kg)   BMI 51.40 kg/m   Wt Readings from Last 3 Encounters:  03/19/18 281 lb (127.5 kg)  12/05/17 275 lb (124.7 kg)  10/10/17 280 lb (127 kg)                                                Physical exam  Constitutional:  +Obese, anxious state of mind.  Eyes: PERRLA, EOMI, - exophthalmos ENT: moist mucous membranes, +  thyromegaly, no cervical lymphadenopathy Cardiovascular: + Normal precordium, regular rate and rhythm.   Respiratory:  adequate breathing efforts, no gross chest deformity, Clear to auscultation bilaterally Gastrointestinal: abdomen soft, Non  -tender, No distension, Bowel Sounds present Musculoskeletal: no gross deformities, strength intact in all four extremities Skin: moist, warm, no rashes Neurological: +  tremor with outstretched hands,  + risk DTR on bilateral lower extremities.     CMP     Component Value Date/Time   NA 141 05/21/2015 1010   K 4.2 05/21/2015 1010   CL 104 05/21/2015 1010   CO2 27 05/21/2015 1010   GLUCOSE 163 (H) 05/21/2015 1010   BUN 13 05/21/2015 1010   CREATININE 0.79 05/21/2015 1010   CALCIUM 9.4 05/21/2015 1010   GFRNONAA >  60 05/21/2015 1010   GFRAA >60 05/21/2015 1010     CBC    Component Value Date/Time   WBC 9.6 05/21/2015 1010   RBC 4.41 05/21/2015 1010   HGB 11.2 (L) 05/21/2015 1010   HCT 35.6 (L) 05/21/2015 1010   PLT 326 05/21/2015 1010   MCV 80.7 05/21/2015 1010   MCH 25.4 (L) 05/21/2015 1010   MCHC 31.5 05/21/2015 1010   RDW 15.0 05/21/2015 1010     Diabetic Labs (most recent): Lab Results  Component Value Date   HGBA1C 5.9 07/31/2017    Lipid Panel     Component Value Date/Time   CHOL 150 12/13/2016   TRIG 172 (A) 12/13/2016   HDL 41 12/13/2016   LDLCALC 75 12/13/2016     Lab Results  Component Value Date   TSH 0.007 (L) 01/29/2018   TSH <0.006 (L) 11/29/2017   TSH 0.01 (A) 07/31/2017   FREET4 1.99 (H) 01/29/2018   FREET4 3.61 (H) 11/29/2017       Assessment & Plan:   1. Hyperthyroidism 2.  Antithyroid autoantibodies -She is responding to antithyroid medications with clinical and biochemical progress.  Labs indicate a possibility of Graves' disease as a cause of primary hyperthyroidism.  The potential risks of untreated thyrotoxicosis and the need for definitive therapy have been discussed in detail with her, and she agrees to proceed with diagnostic workup and treatment plan. -She will eventually require thyroid ablation with I-131 preceded by thyroid uptake and scan.  However,  she is still clinically hyperthyroid and not ready to discontinue  methimazole for the purpose of doing 24-hour thyroid uptake and scan.  -She is advised to lower methimazole to 10 mg p.o. twice daily, along with propanolol 20 mg p.o. twice daily with plan to repeat thyroid function test in 4 months with office visit.  -If by her next visit thyroid is adequately controlled, methimazole treatment would be discontinued for 5 days to perform thyroid uptake and scan.   -Regarding her type 2 diabetes, her A1c is 5.9%.  She is advised to continue metformin 1000 mg p.o. twice daily. -Unintentional loss of 65 pounds might have contributed to improved A1c. - I advised her to maintain close follow up with Andres Shad, MD for primary care needs.    Follow up plan: Return in about 4 months (around 07/18/2018) for Follow up with Pre-visit Labs.   Thank you for involving me in the care of this pleasant patient, and I will continue to update you with her progress.  Glade Lloyd, MD [facility] [place] [facility] Phone: [phone]  Fax: [phone]   03/19/2018, 4:45 PM  This note was partially dictated with voice recognition software. Similar sounding words can be transcribed inadequately or may not  be corrected upon review.

## 2018-05-15 ENCOUNTER — Other Ambulatory Visit: Payer: Self-pay | Admitting: "Endocrinology

## 2018-07-11 ENCOUNTER — Other Ambulatory Visit: Payer: Self-pay | Admitting: "Endocrinology

## 2018-07-12 LAB — T3, FREE: T3, Free: 7 pg/mL — ABNORMAL HIGH (ref 2.0–4.4)

## 2018-07-12 LAB — T4, FREE: Free T4: 2.21 ng/dL — ABNORMAL HIGH (ref 0.82–1.77)

## 2018-07-12 LAB — TSH: TSH: 0.006 u[IU]/mL — ABNORMAL LOW (ref 0.450–4.500)

## 2018-07-19 ENCOUNTER — Ambulatory Visit (INDEPENDENT_AMBULATORY_CARE_PROVIDER_SITE_OTHER): Payer: BC Managed Care – PPO | Admitting: "Endocrinology

## 2018-07-19 ENCOUNTER — Other Ambulatory Visit: Payer: Self-pay

## 2018-07-19 ENCOUNTER — Encounter: Payer: Self-pay | Admitting: "Endocrinology

## 2018-07-19 VITALS — BP 118/81 | HR 84 | Ht 62.0 in | Wt 284.0 lb

## 2018-07-19 DIAGNOSIS — E0590 Thyrotoxicosis, unspecified without thyrotoxic crisis or storm: Secondary | ICD-10-CM | POA: Diagnosis not present

## 2018-07-19 MED ORDER — METHIMAZOLE 10 MG PO TABS: 10.0000 mg | ORAL_TABLET | Freq: Three times a day (TID) | ORAL | 0 refills | Status: DC

## 2018-07-19 NOTE — Progress Notes (Signed)
Endocrinology follow-up Note    Subjective:    Patient ID: Melissa Schmidt, female    DOB: 1975/11/23, PCP Andres Shad, MD.   Past Medical History:  Diagnosis Date  . Arthritis   . Borderline diabetes   . Celiac disease   . Complication of anesthesia    shaking post anesthesia  . Diabetes mellitus without complication ([place])   . Hemorrhoids   . Hypertriglyceridemia   . PCOS (polycystic ovarian syndrome)    Past Surgical History:  Procedure Laterality Date  . APPENDECTOMY  2002  . COLONOSCOPY WITH PROPOFOL N/A 05/25/2015   Dr.Rourk- non-bleeding hemorrhoids, the examination was o/w normal  . ESOPHAGEAL DILATION N/A 05/25/2015   Procedure: ESOPHAGEAL DILATION;  Surgeon: Daneil Dolin, MD;  Location: [facility];  Service: Endoscopy;  Laterality: N/A;  . ESOPHAGOGASTRODUODENOSCOPY (EGD) WITH PROPOFOL N/A 05/25/2015   Dr.Rourk- normal esophagus, dilated, gastric erosions, intact fundoplication, normal third portion of the duodenum, non-bleeding erosive gastropathy. duodenum bx= benign small bowel mucosa with patchy increased intraepithelial lymphocytes, stomach bx= erosive gastritis with reactive changes  . NISSEN FUNDOPLICATION  0092   Social History   Socioeconomic History  . Marital status: Married    Spouse name: Not on file  . Number of children: 1  . Years of education: Not on file  . Highest education level: Not on file  Occupational History  . Occupation: in school  Social Needs  . Financial resource strain: Not on file  . Food insecurity    Worry: Not on file    Inability: Not on file  . Transportation needs    Medical: Not on file    Non-medical: Not on file  Tobacco Use  . Smoking status: Never Smoker  . Smokeless tobacco: Never Used  Substance and Sexual Activity  . Alcohol use: No    Alcohol/week: 0.0 standard drinks  . Drug use: No  . Sexual activity: Yes    Birth control/protection: None  Lifestyle  . Physical activity    Days per  week: Not on file    Minutes per session: Not on file  . Stress: Not on file  Relationships  . Social Herbalist on phone: Not on file    Gets together: Not on file    Attends religious service: Not on file    Active member of club or organization: Not on file    Attends meetings of clubs or organizations: Not on file    Relationship status: Not on file  Other Topics Concern  . Not on file  Social History Narrative  . Not on file   Outpatient Encounter Medications as of 07/19/2018  Medication Sig  . Ascorbic Acid (VITAMIN C ADULT GUMMIES PO) Take by mouth daily.  . ferrous sulfate 325 (65 FE) MG EC tablet Take 325 mg by mouth daily with breakfast.  . fluticasone (FLONASE) 50 MCG/ACT nasal spray Place 2 sprays into both nostrils daily.  . metFORMIN (GLUCOPHAGE) 1000 MG tablet Take 1,000 mg by mouth 2 (two) times daily with a meal.   . methimazole (TAPAZOLE) 10 MG tablet Take 1 tablet (10 mg total) by mouth 3 (three) times daily.  . Multiple Vitamin (MULTIVITAMIN) capsule Take 1 capsule by mouth daily.  . norethindrone (MICRONOR,CAMILA,ERRIN) 0.35 MG tablet Take 1 tablet by mouth daily.  . Omega-3 Fatty Acids (OMEGA-3 FISH OIL PO) Take by mouth 3 (three) times daily.   Marland Kitchen omeprazole (PRILOSEC) 20 MG capsule Take 20 mg by  mouth daily.  . propranolol (INDERAL) 20 MG tablet Take 1 tablet by mouth twice daily  . [DISCONTINUED] methimazole (TAPAZOLE) 10 MG tablet TAKE 1 TABLET BY MOUTH THREE TIMES DAILY   No facility-administered encounter medications on file as of 07/19/2018.     ALLERGIES: No Known Allergies  VACCINATION STATUS:  There is no immunization history on file for this patient.   HPI  Melissa Schmidt is 43 y.o. female who presents today with a medical history as above. she is being seen in follow-up  for hyperthyroidism requested by Andres Shad, MD.   -She is currently on methimazole 10 mg p.o. 2 times daily and propanolol 20 mg p.o. twice daily for  severe and symptomatic hyperthyroidism as a bridging treatment.    She continues to feel better, regaining some of the weight she lost-she lost 65 pounds over 6 months prior to initiation of treatment.  She has no new complaints today.  She continues to feel better. she reports occasional dysphagia, denies shortness of breath, nor voice change.   she denies family history of thyroid dysfunction nor any thyroid cancer.   she  is willing to proceed with appropriate work up and therapy for thyrotoxicosis.                           Review of systems  Constitutional: + Steady weight gain, -fatigue, + subjective hyperthermia Eyes: no blurry vision, - xerophthalmia ENT: no sore throat, no nodules palpated in throat, no dysphagia/odynophagia, nor hoarseness Cardiovascular: no Chest Pain, no Shortness of Breath, -  palpitations, no leg swelling Respiratory: no cough, no SOB Gastrointestinal: no Nausea, no Vomiting, no Diarhhea Musculoskeletal: no muscle/joint aches Skin: no rashes Neurological: -  tremors, no numbness, no tingling, no dizziness Psychiatric: no depression, +  anxiety   Objective:    BP 118/81   Pulse 84   Ht 5' 2"  (1.575 m)   Wt 284 lb (128.8 kg)   BMI 51.94 kg/m   Wt Readings from Last 3 Encounters:  07/19/18 284 lb (128.8 kg)  03/19/18 281 lb (127.5 kg)  12/05/17 275 lb (124.7 kg)                                                Physical exam  Constitutional:  not in acute distress, normal state of mind Eyes:  EOMI, no exophthalmos Neck: Supple Respiratory: Adequate breathing efforts Musculoskeletal: no gross deformities, strength intact in all four extremities Skin:  no rashes, no hyperemia Neurological: no tremor with outstretched hands.   CMP     Component Value Date/Time   NA 141 05/21/2015 1010   K 4.2 05/21/2015 1010   CL 104 05/21/2015 1010   CO2 27 05/21/2015 1010   GLUCOSE 163 (H) 05/21/2015 1010   BUN 13 05/21/2015 1010   CREATININE 0.79  05/21/2015 1010   CALCIUM 9.4 05/21/2015 1010   GFRNONAA >60 05/21/2015 1010   GFRAA >60 05/21/2015 1010     CBC    Component Value Date/Time   WBC 9.6 05/21/2015 1010   RBC 4.41 05/21/2015 1010   HGB 11.2 (L) 05/21/2015 1010   HCT 35.6 (L) 05/21/2015 1010   PLT 326 05/21/2015 1010   MCV 80.7 05/21/2015 1010   MCH 25.4 (L) 05/21/2015 1010   MCHC 31.5 05/21/2015 1010  RDW 15.0 05/21/2015 1010     Diabetic Labs (most recent): Lab Results  Component Value Date   HGBA1C 5.9 07/31/2017    Lipid Panel     Component Value Date/Time   CHOL 150 12/13/2016   TRIG 172 (A) 12/13/2016   HDL 41 12/13/2016   LDLCALC 75 12/13/2016     Lab Results  Component Value Date   TSH 0.006 (L) 07/11/2018   TSH 0.007 (L) 01/29/2018   TSH <0.006 (L) 11/29/2017   TSH 0.01 (A) 07/31/2017   FREET4 2.21 (H) 07/11/2018   FREET4 1.99 (H) 01/29/2018   FREET4 3.61 (H) 11/29/2017       Assessment & Plan:   1. Hyperthyroidism 2.  Antithyroid autoantibodies -She is responding to antithyroid medications with clinical and biochemical progress.  Labs indicate a possibility of Graves' disease as a cause of primary hyperthyroidism.  The potential risks of untreated thyrotoxicosis and the need for definitive therapy have been discussed in detail with her, and she agrees to proceed with diagnostic workup and treatment plan. -She will eventually require thyroid ablation with I-131 preceded by thyroid uptake and scan.  However,  she is still clinically hyperthyroid and not ready to discontinue methimazole for the purpose of doing 24-hour thyroid uptake and scan.  -She is advised to increase methimazole to 10 mg p.o. 3 times daily,  along with propanolol 20 mg p.o. twice daily with plan to repeat thyroid function test in 3 months with office visit.  -If by her next visit thyroid is adequately controlled, methimazole treatment would be discontinued for 5 days to perform thyroid uptake and  scan.   -Regarding her type 2 diabetes, her A1c is 5.9%.  She is advised to continue metformin 1000 mg p.o. twice daily.- she  admits there is a room for improvement in her diet and drink choices. -  Suggestion is made for her to avoid simple carbohydrates  from her diet including Cakes, Sweet Desserts / Pastries, Ice Cream, Soda (diet and regular), Sweet Tea, Candies, Chips, Cookies, Sweet Pastries,  Store Bought Juices, Alcohol in Excess of  1-2 drinks a day, Artificial Sweeteners, Coffee Creamer, and "Sugar-free" Products. This will help patient to have stable blood glucose profile and potentially avoid unintended weight gain.  - I advised her to maintain close follow up with Andres Shad, MD for primary care needs.   Follow up plan: Return in about 10 weeks (around 09/27/2018) for Follow up with Pre-visit Labs.   Thank you for involving me in the care of this pleasant patient, and I will continue to update you with her progress.  Glade Lloyd, MD [facility] End[facility]in[facility] Phone: [phone]  Fax: [phone]   07/19/2018, 4:47 PM  This note was partially dictated with voice recognition software. Similar sounding words can be transcribed inadequately or may not  be corrected upon review.

## 2018-07-27 ENCOUNTER — Other Ambulatory Visit: Payer: Self-pay | Admitting: "Endocrinology

## 2018-08-08 ENCOUNTER — Encounter: Payer: Self-pay | Admitting: Internal Medicine

## 2018-09-05 ENCOUNTER — Ambulatory Visit: Payer: Self-pay | Admitting: Nurse Practitioner

## 2018-09-21 ENCOUNTER — Other Ambulatory Visit: Payer: Self-pay | Admitting: "Endocrinology

## 2018-09-22 LAB — T3, FREE: T3, Free: 5.9 pg/mL — ABNORMAL HIGH (ref 2.0–4.4)

## 2018-09-22 LAB — TSH: TSH: <0.005 u[IU]/mL — ABNORMAL LOW (ref 0.450–4.500)

## 2018-09-22 LAB — T4, FREE: Free T4: 1.89 ng/dL — ABNORMAL HIGH (ref 0.82–1.77)

## 2018-09-28 ENCOUNTER — Encounter: Payer: Self-pay | Admitting: "Endocrinology

## 2018-09-28 ENCOUNTER — Other Ambulatory Visit: Payer: Self-pay

## 2018-09-28 ENCOUNTER — Ambulatory Visit (INDEPENDENT_AMBULATORY_CARE_PROVIDER_SITE_OTHER): Payer: BC Managed Care – PPO | Admitting: "Endocrinology

## 2018-09-28 DIAGNOSIS — E0590 Thyrotoxicosis, unspecified without thyrotoxic crisis or storm: Secondary | ICD-10-CM

## 2018-09-28 MED ORDER — PROPRANOLOL HCL 20 MG PO TABS: 20.0000 mg | ORAL_TABLET | Freq: Two times a day (BID) | ORAL | 1 refills | Status: DC

## 2018-09-28 MED ORDER — METHIMAZOLE 10 MG PO TABS: 10.0000 mg | ORAL_TABLET | Freq: Three times a day (TID) | ORAL | 1 refills | Status: DC

## 2018-09-28 NOTE — Progress Notes (Signed)
09/28/2018                                Endocrinology Telehealth Visit Follow up Note -During COVID -19 Pandemic  I connected with Melissa Schmidt on 09/28/2018   by telephone and verified that I am speaking with the correct person using two identifiers. Melissa Schmidt, 09/01/1975. she has verbally consented to this visit. All issues noted in this document were discussed and addressed. The format was not optimal for physical exam.    Subjective:    Patient ID: Melissa Schmidt, female    DOB: [birth year], PCP Andres Shad, MD.   Past Medical History:  Diagnosis Date  . Arthritis   . Borderline diabetes   . Celiac disease   . Complication of anesthesia    shaking post anesthesia  . Diabetes mellitus without complication ([place])   . Hemorrhoids   . Hypertriglyceridemia   . PCOS (polycystic ovarian syndrome)    Past Surgical History:  Procedure Laterality Date  . APPENDECTOMY  2002  . COLONOSCOPY WITH PROPOFOL N/A 05/25/2015   Dr.Rourk- non-bleeding hemorrhoids, the examination was o/w normal  . ESOPHAGEAL DILATION N/A 05/25/2015   Procedure: ESOPHAGEAL DILATION;  Surgeon: Daneil Dolin, MD;  [facility];  Service: Endoscopy;  Laterality: N/A;  . ESOPHAGOGASTRODUODENOSCOPY (EGD) WITH PROPOFOL N/A 05/25/2015   Dr.Rourk- normal esophagus, dilated, gastric erosions, intact fundoplication, normal third portion of the duodenum, non-bleeding erosive gastropathy. duodenum bx= benign small bowel mucosa with patchy increased intraepithelial lymphocytes, stomach bx= erosive gastritis with reactive changes  . NISSEN FUNDOPLICATION  0272   Social History   Socioeconomic History  . Marital status: Married    Spouse name: Not on file  . Number of children: 1  . Years of education: Not on file  . Highest education level: Not on file  Occupational History  . Occupation: in school  Social Needs  . Financial resource strain: Not on file  . Food insecurity    Worry: Not on file   Inability: Not on file  . Transportation needs    Medical: Not on file    Non-medical: Not on file  Tobacco Use  . Smoking status: Never Smoker  . Smokeless tobacco: Never Used  Substance and Sexual Activity  . Alcohol use: No    Alcohol/week: 0.0 standard drinks  . Drug use: No  . Sexual activity: Yes    Birth control/protection: None  Lifestyle  . Physical activity    Days per week: Not on file    Minutes per session: Not on file  . Stress: Not on file  Relationships  . Social Herbalist on phone: Not on file    Gets together: Not on file    Attends religious service: Not on file    Active member of club or organization: Not on file    Attends meetings of clubs or organizations: Not on file    Relationship status: Not on file  Other Topics Concern  . Not on file  Social History Narrative  . Not on file   Outpatient Encounter Medications as of 09/28/2018  Medication Sig  . Ascorbic Acid (VITAMIN C ADULT GUMMIES PO) Take by mouth daily.  . ferrous sulfate 325 (65 FE) MG EC tablet Take 325 mg by mouth daily with breakfast.  . fluticasone (FLONASE) 50 MCG/ACT nasal spray Place 2 sprays into both nostrils daily.  Marland Kitchen  metFORMIN (GLUCOPHAGE) 1000 MG tablet Take 1,000 mg by mouth 2 (two) times daily with a meal.   . methimazole (TAPAZOLE) 10 MG tablet Take 1 tablet (10 mg total) by mouth 3 (three) times daily.  . Multiple Vitamin (MULTIVITAMIN) capsule Take 1 capsule by mouth daily.  . norethindrone (MICRONOR,CAMILA,ERRIN) 0.35 MG tablet Take 1 tablet by mouth daily.  . Omega-3 Fatty Acids (OMEGA-3 FISH OIL PO) Take by mouth 3 (three) times daily.   Marland Kitchen omeprazole (PRILOSEC) 20 MG capsule Take 20 mg by mouth daily.  . propranolol (INDERAL) 20 MG tablet Take 1 tablet (20 mg total) by mouth 2 (two) times daily.  . [DISCONTINUED] methimazole (TAPAZOLE) 10 MG tablet Take 1 tablet (10 mg total) by mouth 3 (three) times daily.  . [DISCONTINUED] propranolol (INDERAL) 20 MG tablet  Take 1 tablet by mouth twice daily   No facility-administered encounter medications on file as of 09/28/2018.     ALLERGIES: No Known Allergies  VACCINATION STATUS:  There is no immunization history on file for this patient.   HPI  Melissa Schmidt is 43 y.o. female who presents today with a medical history as above. she is being engaged in telehealth via telephone in follow-up  for hyperthyroidism requested by Andres Shad, MD.   -She is currently on methimazole 10 mg p.o. 3 times daily and propanolol 20 mg p.o. twice daily for severe and symptomatic hyperthyroidism as a bridging treatment.    She continues to feel better, regaining some of the weight she lost-she lost 65 pounds over 6 months prior to initiation of treatment.  She has no new complaints today.  Since her last visit, she was diagnosed and treated for COVID-19 pneumonia.   she denies family history of thyroid dysfunction nor any thyroid cancer.   she  is willing to proceed with appropriate work up and therapy for thyrotoxicosis.                           Review of systems  Limited as above  Objective:    There were no vitals taken for this visit.  Wt Readings from Last 3 Encounters:  07/19/18 284 lb (128.8 kg)  03/19/18 281 lb (127.5 kg)  12/05/17 275 lb (124.7 kg)                            CMP     Component Value Date/Time   NA 141 05/21/2015 1010   K 4.2 05/21/2015 1010   CL 104 05/21/2015 1010   CO2 27 05/21/2015 1010   GLUCOSE 163 (H) 05/21/2015 1010   BUN 13 05/21/2015 1010   CREATININE 0.79 05/21/2015 1010   CALCIUM 9.4 05/21/2015 1010   GFRNONAA >60 05/21/2015 1010   GFRAA >60 05/21/2015 1010     CBC    Component Value Date/Time   WBC 9.6 05/21/2015 1010   RBC 4.41 05/21/2015 1010   HGB 11.2 (L) 05/21/2015 1010   HCT 35.6 (L) 05/21/2015 1010   PLT 326 05/21/2015 1010   MCV 80.7 05/21/2015 1010   MCH 25.4 (L) 05/21/2015 1010   MCHC 31.5 05/21/2015 1010   RDW 15.0  05/21/2015 1010     Diabetic Labs (most recent): Lab Results  Component Value Date   HGBA1C 5.9 07/31/2017    Lipid Panel     Component Value Date/Time   CHOL 150 12/13/2016   TRIG 172 (A)  12/13/2016   HDL 41 12/13/2016   LDLCALC 75 12/13/2016     Lab Results  Component Value Date   TSH <0.005 (L) 09/21/2018   TSH 0.006 (L) 07/11/2018   TSH 0.007 (L) 01/29/2018   TSH <0.006 (L) 11/29/2017   TSH 0.01 (A) 07/31/2017   FREET4 1.89 (H) 09/21/2018   FREET4 2.21 (H) 07/11/2018   FREET4 1.99 (H) 01/29/2018   FREET4 3.61 (H) 11/29/2017       Assessment & Plan:   1. Hyperthyroidism 2.  Antithyroid autoantibodies -She is responding to antithyroid medications with clinical and biochemical progress.  Labs indicate a possibility of Graves' disease as a cause of primary hyperthyroidism.  The potential risks of untreated thyrotoxicosis and the need for definitive therapy have been discussed in detail with her, and she agrees to proceed with diagnostic workup and treatment plan. -She will eventually require thyroid ablation with I-131 preceded by thyroid uptake and scan.    However,  she is still clinically hyperthyroid and not ready to discontinue methimazole for the purpose of doing 24-hour thyroid uptake and scan.  -She is advised to continue methimazole  10 mg p.o. 3 times daily,  along with propanolol 20 mg p.o. twice daily with plan to repeat thyroid function test in 3 months with office visit.  -If by her next visit thyroid is adequately controlled, methimazole treatment would be discontinued for 5 days to perform thyroid uptake and scan.   -Regarding her type 2 diabetes, her A1c is 6.1%.  She is advised to continue metformin 1000 mg p.o. twice daily.   - I advised her to maintain close follow up with Andres Shad, MD for primary care needs.  Time for this visit: 15 minutes. Tomeka Kantner Desroches  participated in the discussions, expressed understanding, and voiced  agreement with the above plans.  All questions were answered to her satisfaction. she is encouraged to contact clinic should she have any questions or concerns prior to her return visit.   Follow up plan: Return in about 4 months (around 01/28/2019) for Next Visit A1c in Office.   Thank you for involving me in the care of this pleasant patient, and I will continue to update you with her progress.  Glade Lloyd, MD [facility] Phone: [phone]  Fax: [phone]   09/28/2018, 12:12 PM  This note was partially dictated with voice recognition software. Similar sounding words can be transcribed inadequately or may not  be corrected upon review.

## 2018-10-09 ENCOUNTER — Encounter: Payer: Self-pay | Admitting: Gastroenterology

## 2018-10-09 ENCOUNTER — Ambulatory Visit (INDEPENDENT_AMBULATORY_CARE_PROVIDER_SITE_OTHER): Payer: BC Managed Care – PPO | Admitting: Gastroenterology

## 2018-10-09 ENCOUNTER — Other Ambulatory Visit: Payer: Self-pay

## 2018-10-09 VITALS — BP 140/86 | HR 83 | Temp 97.1°F | Ht 63.0 in | Wt 289.4 lb

## 2018-10-09 DIAGNOSIS — K219 Gastro-esophageal reflux disease without esophagitis: Secondary | ICD-10-CM

## 2018-10-09 DIAGNOSIS — K900 Celiac disease: Secondary | ICD-10-CM

## 2018-10-09 DIAGNOSIS — R1011 Right upper quadrant pain: Secondary | ICD-10-CM | POA: Diagnosis not present

## 2018-10-09 MED ORDER — PANTOPRAZOLE SODIUM 40 MG PO TBEC: 40.0000 mg | DELAYED_RELEASE_TABLET | Freq: Every day | ORAL | 3 refills | Status: DC

## 2018-10-09 NOTE — Progress Notes (Signed)
Primary Care Physician:  Andres Shad, MD Primary Gastroenterologist:  Dr. Gala Romney   Chief Complaint  Patient presents with  . fatty liver  . Celiac Disease  . Abdominal Pain    abd pain after eating    HPI:   Melissa Schmidt is a 43 y.o. female presenting today at the request of Dr. Teryl Lucy due to history of fatty liver, celiac. She was last seen in March 2017. Celiac screen positive in 2017 with biopsy-proven celiac disease at time of EGD (2017).   Reports chronic GERD, on omeprazole and has never tried other agents. Worse at night. PPI not helping as much as it used to. RUQ postprandial pain, no N/V. Worse with fatty/greasy food, present for one year. Gallbladder present. RUQ pain also noted around time of celiac diagnosis. Tries to follow gluten-free diet but is not 100% strict. EGD last year with dilation in [place]. March 2019 EGD with irregular Z-line s/p biopsy, benign-appearing esophageal stenosis s/p dilation, normal examined duodenum but no biopsies of duodenum. Pathology with junctional type GE mucosa with chronic inflammation and surface erosion, no metaplasia, negative dysplasia. Denying constipation. Diarrhea if ingests gluten. No rectal bleeding.   July 2020 hospitalized with COVID. She is seeing a pulmonologist as outpatient due to continued shortness of breath but improving.   Past Medical History:  Diagnosis Date  . Arthritis   . Borderline diabetes   . Celiac disease   . Complication of anesthesia    shaking post anesthesia  . Diabetes mellitus without complication ([place])   . Hemorrhoids   . Hypertriglyceridemia   . PCOS (polycystic ovarian syndrome)     Past Surgical History:  Procedure Laterality Date  . APPENDECTOMY  2002  . COLONOSCOPY WITH PROPOFOL N/A 05/25/2015   Dr.Rourk- non-bleeding hemorrhoids, the examination was o/w normal  . ESOPHAGEAL DILATION N/A 05/25/2015   Procedure: ESOPHAGEAL DILATION;  Surgeon: Daneil Dolin, MD;   Location[facility];  Service: Endoscopy;  Laterality: N/A;  . ESOPHAGOGASTRODUODENOSCOPY  04/2017   [place]: EGD with irregular Z-line s/p biopsy, benign-appearing esophageal stenosis s/p dilation, normal examined duodenum but no biopsies of duodenum. Pathology with junctional type GE mucosa with chronic inflammation and surface erosion, no metaplasia, negative dysplasia.   Marland Kitchen ESOPHAGOGASTRODUODENOSCOPY (EGD) WITH PROPOFOL N/A 05/25/2015   Dr.Rourk- normal esophagus, dilated, gastric erosions, intact fundoplication, normal third portion of the duodenum, non-bleeding erosive gastropathy. duodenum bx= benign small bowel mucosa with patchy increased intraepithelial lymphocytes, mild villous blunting and mild crypt hyperplasia, no dysplasia or malignancy stomach bx= erosive gastritis with reactive changes  . NISSEN FUNDOPLICATION  4098    Current Outpatient Medications  Medication Sig Dispense Refill  . albuterol (VENTOLIN HFA) 108 (90 Base) MCG/ACT inhaler Inhale 2 puffs into the lungs every 6 (six) hours as needed for wheezing or shortness of breath.    . Ascorbic Acid (VITAMIN C ADULT GUMMIES PO) Take by mouth daily.    . ferrous sulfate 325 (65 FE) MG EC tablet Take 325 mg by mouth daily with breakfast.    . fluticasone (FLONASE) 50 MCG/ACT nasal spray Place 2 sprays into both nostrils daily.    Marland Kitchen glimepiride (AMARYL) 1 MG tablet Take 1 mg by mouth daily.    . Ipratropium-Albuterol (COMBIVENT RESPIMAT) 20-100 MCG/ACT AERS respimat Inhale 1 puff into the lungs 4 (four) times daily as needed.    . metFORMIN (GLUCOPHAGE) 1000 MG tablet Take 1,000 mg by mouth 2 (two) times daily with  a meal.     . methimazole (TAPAZOLE) 10 MG tablet Take 1 tablet (10 mg total) by mouth 3 (three) times daily. 270 tablet 1  . Multiple Vitamin (MULTIVITAMIN) capsule Take 1 capsule by mouth daily.    . Omega-3 Fatty Acids (OMEGA-3 FISH OIL PO) Take by mouth 3 (three) times daily.     Marland Kitchen omeprazole (PRILOSEC) 20 MG  capsule Take 20 mg by mouth daily.  3  . propranolol (INDERAL) 20 MG tablet Take 1 tablet (20 mg total) by mouth 2 (two) times daily. 180 tablet 1  . pantoprazole (PROTONIX) 40 MG tablet Take 1 tablet (40 mg total) by mouth daily. Take 30 minutes before breakfast 30 tablet 3   No current facility-administered medications for this visit.     Allergies as of 10/09/2018  . (No Known Allergies)    Family History  Problem Relation Age of Onset  . Irritable bowel syndrome Mother   . Inflammatory bowel disease Neg Hx   . Colon cancer Neg Hx   . Liver disease Neg Hx     Social History   Socioeconomic History  . Marital status: Married    Spouse name: Not on file  . Number of children: 1  . Years of education: Not on file  . Highest education level: Not on file  Occupational History  . Occupation: in school  Social Needs  . Financial resource strain: Not on file  . Food insecurity    Worry: Not on file    Inability: Not on file  . Transportation needs    Medical: Not on file    Non-medical: Not on file  Tobacco Use  . Smoking status: Never Smoker  . Smokeless tobacco: Never Used  Substance and Sexual Activity  . Alcohol use: No    Alcohol/week: 0.0 standard drinks  . Drug use: No  . Sexual activity: Yes    Birth control/protection: None  Lifestyle  . Physical activity    Days per week: Not on file    Minutes per session: Not on file  . Stress: Not on file  Relationships  . Social Herbalist on phone: Not on file    Gets together: Not on file    Attends religious service: Not on file    Active member of club or organization: Not on file    Attends meetings of clubs or organizations: Not on file    Relationship status: Not on file  . Intimate partner violence    Fear of current or ex partner: Not on file    Emotionally abused: Not on file    Physically abused: Not on file    Forced sexual activity: Not on file  Other Topics Concern  . Not on file   Social History Narrative  . Not on file    Review of Systems: Gen: Denies any fever, chills, fatigue, weight loss, lack of appetite.  CV: Denies chest pain, heart palpitations, peripheral edema, syncope.  Resp: see HPI GI: see HPI GU : Denies urinary burning, urinary frequency, urinary hesitancy MS: Denies joint pain, muscle weakness, cramps, or limitation of movement.  Derm: Denies rash, itching, dry skin Psych: Denies depression, anxiety, memory loss, and confusion Heme: see HPI  Physical Exam: BP 140/86   Pulse 83   Temp (!) 97.1 F (36.2 C) (Oral)   Ht 5' 3"  (1.6 m)   Wt 289 lb 6.4 oz (131.3 kg)   LMP 08/25/2018   BMI 51.26  kg/m  General:   Alert and oriented. Pleasant and cooperative. Well-nourished and well-developed.  Head:  Normocephalic and atraumatic. Eyes:  Without icterus, sclera clear and conjunctiva pink.  Ears:  Normal auditory acuity. Lungs:  Clear to auscultation bilaterally. No wheezes, rales, or rhonchi. No distress.  Heart:  S1, S2 present without murmurs appreciated.  Abdomen:  +BS, soft, non-tender and non-distended. No HSM noted. No guarding or rebound. No masses appreciated.  Rectal:  Deferred  Msk:  Symmetrical without gross deformities. Normal posture. Extremities:  Without  edema. Neurologic:  Alert and  oriented x4 Psych:  Alert and cooperative. Normal mood and affect.

## 2018-10-09 NOTE — Patient Instructions (Addendum)
Please have blood work done. We are also ordering an ultrasound of your liver, gallbladder, and spleen.  We will request the records from [facility] (your last endoscopy)  I have attached a reflux diet sheet and celiac disease information.   You will need a bone density test in the future, but we will focus on current issues first.  For reflux: stop omeprazole. Start taking Protonix once each morning, 30 minutes before breakfast.  We will see you back in 2 months!  It was a pleasure to see you today. I want to create trusting relationships with patients to provide genuine, compassionate, and quality care. I value your feedback. If you receive a survey regarding your visit,  I greatly appreciate you taking time to fill this out.   Annitta Needs, PhD, ANP-BC [facility]    Gastroesophageal Reflux Disease, Adult Gastroesophageal reflux (GER) happens when acid from the stomach flows up into the tube that connects the mouth and the stomach (esophagus). Normally, food travels down the esophagus and stays in the stomach to be digested. However, when a person has GER, food and stomach acid sometimes move back up into the esophagus. If this becomes a more serious problem, the person may be diagnosed with a disease called gastroesophageal reflux disease (GERD). GERD occurs when the reflux:  Happens often.  Causes frequent or severe symptoms.  Causes problems such as damage to the esophagus. When stomach acid comes in contact with the esophagus, the acid may cause soreness (inflammation) in the esophagus. Over time, GERD may create small holes (ulcers) in the lining of the esophagus. What are the causes? This condition is caused by a problem with the muscle between the esophagus and the stomach (lower esophageal sphincter, or LES). Normally, the LES muscle closes after food passes through the esophagus to the stomach. When the LES is weakened or abnormal, it does not close properly,  and that allows food and stomach acid to go back up into the esophagus. The LES can be weakened by certain dietary substances, medicines, and medical conditions, including:  Tobacco use.  Pregnancy.  Having a hiatal hernia.  Alcohol use.  Certain foods and beverages, such as coffee, chocolate, onions, and peppermint. What increases the risk? You are more likely to develop this condition if you:  Have an increased body weight.  Have a connective tissue disorder.  Use NSAID medicines. What are the signs or symptoms? Symptoms of this condition include:  Heartburn.  Difficult or painful swallowing.  The feeling of having a lump in the throat.  Abitter taste in the mouth.  Bad breath.  Having a large amount of saliva.  Having an upset or bloated stomach.  Belching.  Chest pain. Different conditions can cause chest pain. Make sure you see your health care provider if you experience chest pain.  Shortness of breath or wheezing.  Ongoing (chronic) cough or a night-time cough.  Wearing away of tooth enamel.  Weight loss. How is this diagnosed? Your health care provider will take a medical history and perform a physical exam. To determine if you have mild or severe GERD, your health care provider may also monitor how you respond to treatment. You may also have tests, including:  A test to examine your stomach and esophagus with a small camera (endoscopy).  A test thatmeasures the acidity level in your esophagus.  A test thatmeasures how much pressure is on your esophagus.  A barium swallow or modified barium swallow test to show the  shape, size, and functioning of your esophagus. How is this treated? The goal of treatment is to help relieve your symptoms and to prevent complications. Treatment for this condition may vary depending on how severe your symptoms are. Your health care provider may recommend:  Changes to your diet.  Medicine.  Surgery. Follow  these instructions at home: Eating and drinking   Follow a diet as recommended by your health care provider. This may involve avoiding foods and drinks such as: ? Coffee and tea (with or without caffeine). ? Drinks that containalcohol. ? Energy drinks and sports drinks. ? Carbonated drinks or sodas. ? Chocolate and cocoa. ? Peppermint and mint flavorings. ? Garlic and onions. ? Horseradish. ? Spicy and acidic foods, including peppers, chili powder, curry powder, vinegar, hot sauces, and barbecue sauce. ? Citrus fruit juices and citrus fruits, such as oranges, lemons, and limes. ? Tomato-based foods, such as red sauce, chili, salsa, and pizza with red sauce. ? Fried and fatty foods, such as donuts, french fries, potato chips, and high-fat dressings. ? High-fat meats, such as hot dogs and fatty cuts of red and white meats, such as rib eye steak, sausage, ham, and bacon. ? High-fat dairy items, such as whole milk, butter, and cream cheese.  Eat small, frequent meals instead of large meals.  Avoid drinking large amounts of liquid with your meals.  Avoid eating meals during the 2-3 hours before bedtime.  Avoid lying down right after you eat.  Do not exercise right after you eat. Lifestyle   Do not use any products that contain nicotine or tobacco, such as cigarettes, e-cigarettes, and chewing tobacco. If you need help quitting, ask your health care provider.  Try to reduce your stress by using methods such as yoga or meditation. If you need help reducing stress, ask your health care provider.  If you are overweight, reduce your weight to an amount that is healthy for you. Ask your health care provider for guidance about a safe weight loss goal. General instructions  Pay attention to any changes in your symptoms.  Take over-the-counter and prescription medicines only as told by your health care provider. Do not take aspirin, ibuprofen, or other NSAIDs unless your health care  provider told you to do so.  Wear loose-fitting clothing. Do not wear anything tight around your waist that causes pressure on your abdomen.  Raise (elevate) the head of your bed about 6 inches (15 cm).  Avoid bending over if this makes your symptoms worse.  Keep all follow-up visits as told by your health care provider. This is important. Contact a health care provider if:  You have: ? New symptoms. ? Unexplained weight loss. ? Difficulty swallowing or it hurts to swallow. ? Wheezing or a persistent cough. ? A hoarse voice.  Your symptoms do not improve with treatment. Get help right away if you:  Have pain in your arms, neck, jaw, teeth, or back.  Feel sweaty, dizzy, or light-headed.  Have chest pain or shortness of breath.  Vomit and your vomit looks like blood or coffee grounds.  Faint.  Have stool that is bloody or black.  Cannot swallow, drink, or eat. Summary  Gastroesophageal reflux happens when acid from the stomach flows up into the esophagus. GERD is a disease in which the reflux happens often, causes frequent or severe symptoms, or causes problems such as damage to the esophagus.  Treatment for this condition may vary depending on how severe your symptoms are. Your health  care provider may recommend diet and lifestyle changes, medicine, or surgery.  Contact a health care provider if you have new or worsening symptoms.  Take over-the-counter and prescription medicines only as told by your health care provider. Do not take aspirin, ibuprofen, or other NSAIDs unless your health care provider told you to do so.  Keep all follow-up visits as told by your health care provider. This is important. This information is not intended to replace advice given to you by your health care provider. Make sure you discuss any questions you have with your health care provider. Document Released: 11/03/2004 Document Revised: 08/02/2017 Document Reviewed: 08/02/2017 Elsevier  Patient Education  [place].  Gluten-Free Diet for Celiac Disease, Adult  The gluten-free diet includes all foods that do not contain gluten. Gluten is a protein that is found in wheat, rye, barley, and some other grains. Following the gluten-free diet is the only treatment for people with celiac disease. It helps to prevent damage to the intestines and improves or eliminates the symptoms of celiac disease. Following the gluten-free diet requires some planning. It can be challenging at first, but it gets easier with time and practice. There are more gluten-free options available today than ever before. If you need help finding gluten-free foods or if you have questions, talk with your diet and nutrition specialist (registered dietitian) or your health care provider. What do I need to know about a gluten-free diet?  All fruits, vegetables, and meats are safe to eat and do not contain gluten.  When grocery shopping, start by shopping in the produce, meat, and dairy sections. These sections are more likely to contain gluten-free foods. Then move to the aisles that contain packaged foods if you need to.  Read all food labels. Gluten is often added to foods. Always check the ingredient list and look for warnings, such as "may contain gluten."  Talk with your dietitian or health care provider before taking a gluten-free multivitamin or mineral supplement.  Be aware of gluten-free foods having contact with foods that contain gluten (cross-contamination). This can happen at home and with any processed foods. ? Talk with your health care provider or dietitian about how to reduce the risk of cross-contamination in your home. ? If you have questions about how a food is processed, ask the manufacturer. What key words help to identify gluten? Foods that list any of these key words on the label usually contain gluten:  Wheat, flour, enriched flour, bromated flour, white flour, durum flour, graham  flour, phosphated flour, self-rising flour, semolina, farina, barley (malt), rye, and oats.  Starch, dextrin, modified food starch, or cereal.  Thickening, fillers, or emulsifiers.  Malt flavoring, malt extract, or malt syrup.  Hydrolyzed vegetable protein. In the [place] packaged foods that are gluten-free are required to be labeled "GF." These foods should be easy to identify and are safe to eat. In the [place] food companies are also required to list common food allergens, including wheat, on their labels. Recommended foods Grains  Amaranth, bean flours, 100% buckwheat flour, corn, millet, nut flours or nut meals, GF oats, quinoa, rice, sorghum, teff, rice wafers, pure cornmeal tortillas, popcorn, and hot cereals made from cornmeal. Hominy, rice, wild rice. Some Asian rice noodles or bean noodles. Arrowroot starch, corn bran, corn flour, corn germ, cornmeal, corn starch, potato flour, potato starch flour, and rice bran. Plain, brown, and sweet rice flours. Rice polish, soy flour, and tapioca starch. Vegetables  All plain fresh, frozen, and canned  vegetables. Fruits  All plain fresh, frozen, canned, and dried fruits, and 100% fruit juices. Meats and other protein foods  All fresh beef, pork, poultry, fish, seafood, and eggs. Fish canned in water, oil, brine, or vegetable broth. Plain nuts and seeds, peanut butter. Some lunch meat and some frankfurters. Dried beans, dried peas, and lentils. Dairy  Fresh plain, dry, evaporated, or condensed milk. Cream, butter, sour cream, whipping cream, and most yogurts. Unprocessed cheese, most processed cheeses, some cottage cheese, some cream cheeses. Beverages  Coffee, tea, most herbal teas. Carbonated beverages and some root beers. Wine, sake, and distilled spirits, such as gin, vodka, and whiskey. Most hard ciders. Fats and oils  Butter, margarine, vegetable oil, hydrogenated butter, olive oil, shortening, lard, cream, and some mayonnaise. Some  commercial salad dressings. Olives. Sweets and desserts  Sugar, honey, some syrups, molasses, jelly, and jam. Plain hard candy, marshmallows, and gumdrops. Pure cocoa powder. Plain chocolate. Custard and some pudding mixes. Gelatin desserts, sorbets, frozen ice pops, and sherbet. Cake, cookies, and other desserts prepared with allowed flours. Some commercial ice creams. Cornstarch, tapioca, and rice puddings. Seasoning and other foods  Some canned or frozen soups. Monosodium glutamate (MSG). Cider, rice, and wine vinegar. Baking soda and baking powder. Cream of tartar. Baking and nutritional yeast. Certain soy sauces made without wheat (ask your dietitian about specific brands that are allowed). Nuts, coconut, and chocolate. Salt, pepper, herbs, spices, flavoring extracts, imitation or artificial flavorings, natural flavorings, and food colorings. Some medicines and supplements. Some lip glosses and other cosmetics. Rice syrups. The items listed may not be a complete list. Talk with your dietitian about what dietary choices are best for you. Foods to avoid Grains  Barley, bran, bulgur, couscous, cracked wheat, [place], farro, graham, malt, matzo, semolina, wheat germ, and all wheat and rye cereals including spelt and kamut. Cereals containing malt as a flavoring, such as rice cereal. Noodles, spaghetti, macaroni, most packaged rice mixes, and all mixes containing wheat, rye, barley, or triticale. Vegetables  Most creamed vegetables and most vegetables canned in sauces. Some commercially prepared vegetables and salads. Fruits  Thickened or prepared fruits and some pie fillings. Some fruit snacks and fruit roll-ups. Meats and other protein foods  Any meat or meat alternative containing wheat, rye, barley, or gluten stabilizers. These are often marinated or packaged meats and lunch meats. Bread-containing products, such as Swiss steak, croquettes, meatballs, and meatloaf. Most tuna canned in  vegetable broth and [place] with hydrolyzed vegetable protein (HVP) injected as part of the basting. [place]. Imitation fish. Eggs in sauces made from ingredients to avoid. Dairy  Commercial chocolate milk drinks and malted milk. Some non-dairy creamers. Any cheese product containing ingredients to avoid. Beverages  Certain cereal beverages. Beer, ale, malted milk, and some root beers. Some hard ciders. Some instant flavored coffees. Some herbal teas made with barley or with barley malt added. Fats and oils  Some commercial salad dressings. Sour cream containing modified food starch. Sweets and desserts  Some toffees. Chocolate-coated nuts (may be rolled in wheat flour) and some commercial candies and candy bars. Most cakes, cookies, donuts, pastries, and other baked goods. Some commercial ice cream. Ice cream cones. Commercially prepared mixes for cakes, cookies, and other desserts. Bread pudding and other puddings thickened with flour. Products containing brown rice syrup made with barley malt enzyme. Desserts and sweets made with malt flavoring. Seasoning and other foods  Some curry powders, some dry seasoning mixes, some gravy extracts, some meat sauces, some ketchups,  some prepared mustards, and horseradish. Certain soy sauces. Malt vinegar. Bouillon and bouillon cubes that contain HVP. Some chip dips, and some chewing gum. Yeast extract. Brewer's yeast. Caramel color. Some medicines and supplements. Some lip glosses and other cosmetics. The items listed may not be a complete list. Talk with your dietitian about what dietary choices are best for you. Summary  Gluten is a protein that is found in wheat, rye, barley, and some other grains. The gluten-free diet includes all foods that do not contain gluten.  If you need help finding gluten-free foods or if you have questions, talk with your diet and nutrition specialist (registered dietitian) or your health care provider.  Read all food  labels. Gluten is often added to foods. Always check the ingredient list and look for warnings, such as "may contain gluten." This information is not intended to replace advice given to you by your health care provider. Make sure you discuss any questions you have with your health care provider. Document Released: 01/24/2005 Document Revised: 01/06/2017 Document Reviewed: 11/09/2015 Elsevier Patient Education  2020 [employer].

## 2018-10-10 ENCOUNTER — Encounter: Payer: Self-pay | Admitting: Gastroenterology

## 2018-10-10 DIAGNOSIS — R1011 Right upper quadrant pain: Secondary | ICD-10-CM | POA: Insufficient documentation

## 2018-10-10 DIAGNOSIS — K219 Gastro-esophageal reflux disease without esophagitis: Secondary | ICD-10-CM | POA: Insufficient documentation

## 2018-10-10 DIAGNOSIS — K900 Celiac disease: Secondary | ICD-10-CM | POA: Insufficient documentation

## 2018-10-10 NOTE — Assessment & Plan Note (Signed)
43 year old female with postprandial RUQ abdominal pain, specifically with trigger foods that contain fat and grease, gallbladder present. However, she has also noted similar symptoms when first diagnosed with celiac disease in 2017. Gluten-free diet is not strictly followed, but she attempts to adhere for the most part. EGD on file from March 2019 in [place]. Known history of fatty liver. Proceed with US abdomen complete. Labs to include CBC, CMP, and further labs due to known history of celiac disease (iron studies,  B12 and folate, Vit D. Further recommendations to follow. Adherence to gluten-free diet discussed.

## 2018-10-10 NOTE — Assessment & Plan Note (Signed)
Lost to follow-up, originally diagnosed in 2017. Labs ordered today. Will need DEXA scan but will arrange this when she returns in 2 months. Strict gluten-free diet.

## 2018-10-10 NOTE — Assessment & Plan Note (Signed)
Prilosec with breakthrough symptoms. No other PPI previously. Trial Protonix once daily.

## 2018-10-17 ENCOUNTER — Other Ambulatory Visit: Payer: Self-pay

## 2018-10-17 ENCOUNTER — Ambulatory Visit (HOSPITAL_COMMUNITY)
Admission: RE | Admit: 2018-10-17 | Discharge: 2018-10-17 | Disposition: A | Discharge status: Home / Self Care | Payer: BC Managed Care – PPO | Source: Ambulatory Visit | Attending: Gastroenterology | Admitting: Gastroenterology

## 2018-10-17 DIAGNOSIS — R1011 Right upper quadrant pain: Secondary | ICD-10-CM

## 2018-10-24 NOTE — Progress Notes (Signed)
Fatty liver on ultrasound. We need labs to be completed. Melissa Schmidt, can we also see about the EGD report we requested with pathology?

## 2018-11-05 ENCOUNTER — Other Ambulatory Visit: Payer: Self-pay | Admitting: Gastroenterology

## 2018-11-06 ENCOUNTER — Telehealth: Payer: Self-pay | Admitting: "Endocrinology

## 2018-11-06 DIAGNOSIS — E0590 Thyrotoxicosis, unspecified without thyrotoxic crisis or storm: Secondary | ICD-10-CM

## 2018-11-06 NOTE — Telephone Encounter (Signed)
Patient called and wants you to call her, please

## 2018-11-07 LAB — CBC/DIFF AMBIGUOUS DEFAULT
Basophils Absolute: 0 10*3/uL (ref 0.0–0.2)
Basos: 0 %
EOS (ABSOLUTE): 0.2 10*3/uL (ref 0.0–0.4)
Eos: 2 %
Hematocrit: 38.3 % (ref 34.0–46.6)
Hemoglobin: 11.9 g/dL (ref 11.1–15.9)
Immature Grans (Abs): 0 10*3/uL (ref 0.0–0.1)
Immature Granulocytes: 0 %
Lymphocytes Absolute: 3.3 10*3/uL — ABNORMAL HIGH (ref 0.7–3.1)
Lymphs: 31 %
MCH: 23 pg — ABNORMAL LOW (ref 26.6–33.0)
MCHC: 31.1 g/dL — ABNORMAL LOW (ref 31.5–35.7)
MCV: 74 fL — ABNORMAL LOW (ref 79–97)
Monocytes Absolute: 0.8 10*3/uL (ref 0.1–0.9)
Monocytes: 8 %
Neutrophils Absolute: 6.3 10*3/uL (ref 1.4–7.0)
Neutrophils: 59 %
Platelets: 428 10*3/uL (ref 150–450)
RBC: 5.18 x10E6/uL (ref 3.77–5.28)
RDW: 15.5 % — ABNORMAL HIGH (ref 11.7–15.4)
WBC: 10.8 10*3/uL (ref 3.4–10.8)

## 2018-11-07 LAB — COMPREHENSIVE METABOLIC PANEL
ALT: 22 IU/L (ref 0–32)
AST: 16 IU/L (ref 0–40)
Albumin/Globulin Ratio: 1.6 (ref 1.2–2.2)
Albumin: 4.2 g/dL (ref 3.8–4.8)
Alkaline Phosphatase: 89 IU/L (ref 39–117)
BUN/Creatinine Ratio: 17 (ref 9–23)
BUN: 12 mg/dL (ref 6–24)
Bilirubin Total: <0.2 mg/dL (ref 0.0–1.2)
CO2: 25 mmol/L (ref 20–29)
Calcium: 9.9 mg/dL (ref 8.7–10.2)
Chloride: 103 mmol/L (ref 96–106)
Creatinine, Ser: 0.71 mg/dL (ref 0.57–1.00)
GFR calc Af Amer: 122 mL/min/{1.73_m2} (ref >59)
GFR calc non Af Amer: 106 mL/min/{1.73_m2} (ref >59)
Globulin, Total: 2.6 g/dL (ref 1.5–4.5)
Glucose: 55 mg/dL — ABNORMAL LOW (ref 65–99)
Potassium: 4.6 mmol/L (ref 3.5–5.2)
Sodium: 140 mmol/L (ref 134–144)
Total Protein: 6.8 g/dL (ref 6.0–8.5)

## 2018-11-07 LAB — TISSUE TRANSGLUTAMINASE, IGA: Transglutaminase IgA: 2 U/mL (ref 0–3)

## 2018-11-07 LAB — IRON AND TIBC
Iron Saturation: 11 % — ABNORMAL LOW (ref 15–55)
Iron: 30 ug/dL (ref 27–159)
Total Iron Binding Capacity: 271 ug/dL (ref 250–450)
UIBC: 241 ug/dL (ref 131–425)

## 2018-11-07 LAB — B12 AND FOLATE PANEL
Folate: 8 ng/mL (ref >3.0)
Vitamin B-12: 447 pg/mL (ref 232–1245)

## 2018-11-07 LAB — VITAMIN D 25 HYDROXY (VIT D DEFICIENCY, FRACTURES): Vit D, 25-Hydroxy: 26.4 ng/mL — ABNORMAL LOW (ref 30.0–100.0)

## 2018-11-07 LAB — FERRITIN: Ferritin: 56 ng/mL (ref 15–150)

## 2018-11-07 LAB — SPECIMEN STATUS REPORT

## 2018-11-07 NOTE — Patient Instructions (Addendum)
Labs placed in AB's box.  AB, disregard previous message. Lab results are in Epic.

## 2018-11-07 NOTE — Telephone Encounter (Signed)
Pt.notified

## 2018-11-07 NOTE — Telephone Encounter (Signed)
Pt states she is unsure if she needs to see Dr Dorris Fetch or someone else. She has had rapid HR up to 135. Elevated BP up 160/100. And SOB. She has been taking her methimazole and propranolol as prescribed.

## 2018-11-07 NOTE — Telephone Encounter (Signed)
Lft msg for pt to call back

## 2018-11-07 NOTE — Telephone Encounter (Signed)
She can increase her Propranolol to 58m po q 8 hours. She has to get her labs  , free t4 and TSH ASAP ( preferably tomorrow). Call back if pulse still above 125.

## 2018-11-08 ENCOUNTER — Other Ambulatory Visit: Payer: Self-pay | Admitting: "Endocrinology

## 2018-11-09 LAB — T4, FREE: Free T4: 1.93 ng/dL — ABNORMAL HIGH (ref 0.82–1.77)

## 2018-11-09 LAB — TSH: TSH: <0.005 u[IU]/mL — ABNORMAL LOW (ref 0.450–4.500)

## 2018-11-12 NOTE — Progress Notes (Signed)
Microcytic indices on CBC. Ferritin is normal but low end of normal and down from 3 years ago. Vit B12 and folate are good. No Vit D deficiency but needs to take Calcium and D daily due to history of celiac disease. Recommend 800 units Vit D daily (Caltrate has combo of Calcium and D). Strict adherence to gluten free diet is imperative.

## 2018-11-14 ENCOUNTER — Telehealth: Payer: Self-pay

## 2018-11-14 NOTE — Telephone Encounter (Signed)
Notified pt of results. Advised her that I would call her tomorrow with an appt for the Thyroid Uptake/scan

## 2018-11-14 NOTE — Telephone Encounter (Signed)
-----   Message from Cassandria Anger, MD sent at 11/14/2018  4:15 PM EDT ----- Maudie Mercury, I want this patient to get thyroid uptake and scan ASAP ( she will have to stop methimazole for 5 days prior to her scan). I want to see her on day of her scan read, will likely need ablation.

## 2018-11-15 ENCOUNTER — Other Ambulatory Visit: Payer: Self-pay | Admitting: "Endocrinology

## 2018-11-15 DIAGNOSIS — E0590 Thyrotoxicosis, unspecified without thyrotoxic crisis or storm: Secondary | ICD-10-CM

## 2018-11-15 NOTE — Telephone Encounter (Signed)
Pt stopped methimazole. Also notified of NM appt date and time.

## 2018-11-26 ENCOUNTER — Other Ambulatory Visit: Payer: Self-pay

## 2018-11-26 ENCOUNTER — Encounter (HOSPITAL_COMMUNITY)
Admission: RE | Admit: 2018-11-26 | Discharge: 2018-11-26 | Disposition: A | Discharge status: Home / Self Care | Payer: BC Managed Care – PPO | Source: Ambulatory Visit | Attending: "Endocrinology | Admitting: "Endocrinology

## 2018-11-26 DIAGNOSIS — E0590 Thyrotoxicosis, unspecified without thyrotoxic crisis or storm: Secondary | ICD-10-CM | POA: Insufficient documentation

## 2018-11-26 MED ORDER — SODIUM IODIDE I-123 7.4 MBQ CAPS
304.0000 | ORAL_CAPSULE | Freq: Once | ORAL | Status: AC
  Administered 2018-11-26: 304 via ORAL

## 2018-11-27 ENCOUNTER — Encounter: Payer: Self-pay | Admitting: "Endocrinology

## 2018-11-27 ENCOUNTER — Encounter (HOSPITAL_COMMUNITY)
Admission: RE | Admit: 2018-11-27 | Discharge: 2018-11-27 | Disposition: A | Discharge status: Home / Self Care | Payer: BC Managed Care – PPO | Source: Ambulatory Visit | Attending: "Endocrinology | Admitting: "Endocrinology

## 2018-11-27 ENCOUNTER — Ambulatory Visit (INDEPENDENT_AMBULATORY_CARE_PROVIDER_SITE_OTHER): Payer: BC Managed Care – PPO | Admitting: "Endocrinology

## 2018-11-27 DIAGNOSIS — E0590 Thyrotoxicosis, unspecified without thyrotoxic crisis or storm: Secondary | ICD-10-CM

## 2018-11-27 DIAGNOSIS — E0500 Thyrotoxicosis with diffuse goiter without thyrotoxic crisis or storm: Secondary | ICD-10-CM | POA: Diagnosis not present

## 2018-11-27 NOTE — Progress Notes (Signed)
11/27/2018                                Endocrinology Telehealth Visit Follow up Note -During COVID -19 Pandemic  I connected with Melissa Schmidt on 11/27/2018   by telephone and verified that I am speaking with the correct person using two identifiers. Melissa Schmidt, May 01, 1975. she has verbally consented to this visit. All issues noted in this document were discussed and addressed. The format was not optimal for physical exam.    Subjective:    Patient ID: Melissa Schmidt, female    DOB: [birth year], PCP Andres Shad, MD.   Past Medical History:  Diagnosis Date  . Arthritis   . Borderline diabetes   . Celiac disease   . Complication of anesthesia    shaking post anesthesia  . Diabetes mellitus without complication ([place])   . Hemorrhoids   . Hypertriglyceridemia   . PCOS (polycystic ovarian syndrome)    Past Surgical History:  Procedure Laterality Date  . APPENDECTOMY  2002  . COLONOSCOPY WITH PROPOFOL N/A 05/25/2015   Dr.Rourk- non-bleeding hemorrhoids, the examination was o/w normal  . ESOPHAGEAL DILATION N/A 05/25/2015   Procedure: ESOPHAGEAL DILATION;  Surgeon: Daneil Dolin, MD;  [facility];  Service: Endoscopy;  Laterality: N/A;  . ESOPHAGOGASTRODUODENOSCOPY  04/2017   [place]: EGD with irregular Z-line s/p biopsy, benign-appearing esophageal stenosis s/p dilation, normal examined duodenum but no biopsies of duodenum. Pathology with junctional type GE mucosa with chronic inflammation and surface erosion, no metaplasia, negative dysplasia.   Marland Kitchen ESOPHAGOGASTRODUODENOSCOPY (EGD) WITH PROPOFOL N/A 05/25/2015   Dr.Rourk- normal esophagus, dilated, gastric erosions, intact fundoplication, normal third portion of the duodenum, non-bleeding erosive gastropathy. duodenum bx= benign small bowel mucosa with patchy increased intraepithelial lymphocytes, mild villous blunting and mild crypt hyperplasia, no dysplasia or malignancy stomach bx= erosive gastritis with reactive  changes  . NISSEN FUNDOPLICATION  9983   Social History   Socioeconomic History  . Marital status: Married    Spouse name: Not on file  . Number of children: 1  . Years of education: Not on file  . Highest education level: Not on file  Occupational History  . Occupation: in school  Social Needs  . Financial resource strain: Not on file  . Food insecurity    Worry: Not on file    Inability: Not on file  . Transportation needs    Medical: Not on file    Non-medical: Not on file  Tobacco Use  . Smoking status: Never Smoker  . Smokeless tobacco: Never Used  Substance and Sexual Activity  . Alcohol use: No    Alcohol/week: 0.0 standard drinks  . Drug use: No  . Sexual activity: Yes    Birth control/protection: None  Lifestyle  . Physical activity    Days per week: Not on file    Minutes per session: Not on file  . Stress: Not on file  Relationships  . Social Herbalist on phone: Not on file    Gets together: Not on file    Attends religious service: Not on file    Active member of club or organization: Not on file    Attends meetings of clubs or organizations: Not on file    Relationship status: Not on file  Other Topics Concern  . Not on file  Social History Narrative  . Not on file  Outpatient Encounter Medications as of 11/27/2018  Medication Sig  . albuterol (VENTOLIN HFA) 108 (90 Base) MCG/ACT inhaler Inhale 2 puffs into the lungs every 6 (six) hours as needed for wheezing or shortness of breath.  . Ascorbic Acid (VITAMIN C ADULT GUMMIES PO) Take by mouth daily.  . ferrous sulfate 325 (65 FE) MG EC tablet Take 325 mg by mouth daily with breakfast.  . fluticasone (FLONASE) 50 MCG/ACT nasal spray Place 2 sprays into both nostrils daily.  Marland Kitchen glimepiride (AMARYL) 1 MG tablet Take 1 mg by mouth daily.  . Ipratropium-Albuterol (COMBIVENT RESPIMAT) 20-100 MCG/ACT AERS respimat Inhale 1 puff into the lungs 4 (four) times daily as needed.  . metFORMIN  (GLUCOPHAGE) 1000 MG tablet Take 1,000 mg by mouth 2 (two) times daily with a meal.   . Multiple Vitamin (MULTIVITAMIN) capsule Take 1 capsule by mouth daily.  . Omega-3 Fatty Acids (OMEGA-3 FISH OIL PO) Take by mouth 3 (three) times daily.   Marland Kitchen omeprazole (PRILOSEC) 20 MG capsule Take 20 mg by mouth daily.  . pantoprazole (PROTONIX) 40 MG tablet Take 1 tablet (40 mg total) by mouth daily. Take 30 minutes before breakfast  . propranolol (INDERAL) 20 MG tablet Take 1 tablet (20 mg total) by mouth 2 (two) times daily.  . [DISCONTINUED] methimazole (TAPAZOLE) 10 MG tablet Take 1 tablet (10 mg total) by mouth 3 (three) times daily.   No facility-administered encounter medications on file as of 11/27/2018.     ALLERGIES: No Known Allergies  VACCINATION STATUS:  There is no immunization history on file for this patient.   HPI  Melissa Schmidt is 43 y.o. female who presents today with a medical history as above. she is being engaged in telehealth via telephone in follow-up  for hyperthyroidism requested by Andres Shad, MD.   -She was recently taken off of her methimazole in preparation for thyroid uptake and scan.  This study was completed today and showing thyroid uptake of 68% in 24 hours, uniform suggesting Graves' disease.  She is on propranolol 40 mg p.o. twice daily.    She continues to feel better, regaining some of the weight she lost-she lost 65 pounds over 6 months prior to initiation of treatment.  She has no new complaints today.  Since her last visit, she was diagnosed and treated for COVID-19 pneumonia.   she denies family history of thyroid dysfunction nor any thyroid cancer.   she  is willing to proceed with appropriate work up and therapy for thyrotoxicosis.                           Review of systems  Limited as above  Objective:    There were no vitals taken for this visit.  Wt Readings from Last 3 Encounters:  10/09/18 289 lb 6.4 oz (131.3 kg)  07/19/18 284  lb (128.8 kg)  03/19/18 281 lb (127.5 kg)                     Component Value Date/Time   NA 140 11/05/2018 1551   K 4.6 11/05/2018 1551   CL 103 11/05/2018 1551   CO2 25 11/05/2018 1551   GLUCOSE 55 (L) 11/05/2018 1551   GLUCOSE 163 (H) 05/21/2015 1010   BUN 12 11/05/2018 1551   CREATININE 0.71 11/05/2018 1551   CALCIUM 9.9 11/05/2018 1551   PROT 6.8 11/05/2018 1551   ALBUMIN 4.2 11/05/2018 1551  AST 16 11/05/2018 1551   ALT 22 11/05/2018 1551   ALKPHOS 89 11/05/2018 1551   BILITOT <0.2 11/05/2018 1551   GFRNONAA 106 11/05/2018 1551   GFRAA 122 11/05/2018 1551     CBC    Component Value Date/Time   WBC 10.8 11/05/2018 1551   WBC 9.6 05/21/2015 1010   RBC 5.18 11/05/2018 1551   RBC 4.41 05/21/2015 1010   HGB 11.9 11/05/2018 1551   HCT 38.3 11/05/2018 1551   PLT 428 11/05/2018 1551   MCV 74 (L) 11/05/2018 1551   MCH 23.0 (L) 11/05/2018 1551   MCH 25.4 (L) 05/21/2015 1010   MCHC 31.1 (L) 11/05/2018 1551   MCHC 31.5 05/21/2015 1010   RDW 15.5 (H) 11/05/2018 1551   LYMPHSABS 3.3 (H) 11/05/2018 1551   EOSABS 0.2 11/05/2018 1551   BASOSABS 0.0 11/05/2018 1551     Diabetic Labs (most recent): Lab Results  Component Value Date   HGBA1C 5.9 07/31/2017    Lipid Panel     Component Value Date/Time   CHOL 150 12/13/2016   TRIG 172 (A) 12/13/2016   HDL 41 12/13/2016   LDLCALC 75 12/13/2016     Lab Results  Component Value Date   TSH <0.005 (L) 11/08/2018   TSH <0.005 (L) 09/21/2018   TSH 0.006 (L) 07/11/2018   TSH 0.007 (L) 01/29/2018   TSH <0.006 (L) 11/29/2017   TSH 0.01 (A) 07/31/2017   FREET4 1.93 (H) 11/08/2018   FREET4 1.89 (H) 09/21/2018   FREET4 2.21 (H) 07/11/2018   FREET4 1.99 (H) 01/29/2018   FREET4 3.61 (H) 11/29/2017    Thyroid uptake and scan on October 20, 20 FINDINGS: Homogeneous tracer accumulation within both thyroid lobes, diffusely increased.  No focal areas of increased or decreased tracer localization.  4 hour I-123  uptake = 57% (normal 5-20%)  24 hour I-123 uptake = 68% (normal 10-30%)  IMPRESSION: Diffusely increased homogeneous tracer accumulation in both thyroid lobes.  Increased 4 hour and 24 hour radio iodine uptakes consistent with hyperthyroidism.  Findings consistent with Graves disease.  Assessment & Plan:   1. 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   -She has partially responded to methimazole treatment.  I discussed and ordered thyroid ablation with I-131 and she agrees.   This treatment will be scheduled to be administered in [facility] in the next several days.  She is advised to stay off of methimazole, continue propranolol 40 mg p.o. twice daily. She understands the subsequent need for thyroid hormone replacement.  She will have repeat thyroid function test in 8 weeks with office visit in 9 weeks. -Regarding her type 2 diabetes, her A1c is 6.1%.  She is advised to continue metformin 1000 mg p.o. twice daily.  She will have A1c during her next visit.   - I advised her to maintain close follow up with Andres Shad, MD for primary care needs.   Time for this visit: 15 minutes. Kenzly Rogoff Sargeant  participated in the discussions, expressed understanding, and voiced agreement with the above plans.  All questions were answered to her satisfaction. she is encouraged to contact clinic should she have any questions or concerns prior to her return visit.    Follow up plan: Return in about 9 weeks (around 01/29/2019) for Follow up with Labs after I131 Therapy, Next Visit A1c in Office.   Thank you for involving me in the care of this pleasant  patient, and I will continue to update you with her progress.  Glade Lloyd, MD [facility] Phone: [phone]  Fax: [phone]   11/27/2018, 4:39 PM  This note was partially dictated with  voice recognition software. Similar sounding words can be transcribed inadequately or may not  be corrected upon review.

## 2018-12-07 ENCOUNTER — Ambulatory Visit (HOSPITAL_COMMUNITY): Payer: BC Managed Care – PPO

## 2018-12-11 ENCOUNTER — Other Ambulatory Visit: Payer: Self-pay

## 2018-12-11 ENCOUNTER — Encounter: Payer: Self-pay | Admitting: Gastroenterology

## 2018-12-11 ENCOUNTER — Ambulatory Visit (INDEPENDENT_AMBULATORY_CARE_PROVIDER_SITE_OTHER): Payer: BC Managed Care – PPO | Admitting: Gastroenterology

## 2018-12-11 VITALS — BP 140/85 | HR 100 | Temp 96.9°F | Ht 63.0 in | Wt 280.8 lb

## 2018-12-11 DIAGNOSIS — K219 Gastro-esophageal reflux disease without esophagitis: Secondary | ICD-10-CM

## 2018-12-11 DIAGNOSIS — K649 Unspecified hemorrhoids: Secondary | ICD-10-CM | POA: Insufficient documentation

## 2018-12-11 DIAGNOSIS — K900 Celiac disease: Secondary | ICD-10-CM

## 2018-12-11 MED ORDER — HYDROCORTISONE (PERIANAL) 2.5 % EX CREA: 1.0000 "application " | TOPICAL_CREAM | Freq: Two times a day (BID) | CUTANEOUS | 1 refills | Status: DC

## 2018-12-11 NOTE — Patient Instructions (Addendum)
Start taking an additional 800 international units (iu) of Vit D daily. We will recheck in 3 months.  Continue Protonix daily.  I sent in a cream to use per rectum twice a day for a week. You can repeat if needed.   Once you come off Xarelto, please let us know. We can consider banding at that time.  We will arrange a bone density scan in the future.  We will see you back in 6 months!  I enjoyed seeing you again today! As you know, I value our relationship and want to provide genuine, compassionate, and quality care. I welcome your feedback. If you receive a survey regarding your visit,  I greatly appreciate you taking time to fill this out. See you next time!  Annitta Needs, PhD, ANP-BC [facility]   Hemorrhoids Hemorrhoids are swollen veins in and around the rectum or anus. There are two types of hemorrhoids:  Internal hemorrhoids. These occur in the veins that are just inside the rectum. They may poke through to the outside and become irritated and painful.  External hemorrhoids. These occur in the veins that are outside the anus and can be felt as a painful swelling or hard lump near the anus. Most hemorrhoids do not cause serious problems, and they can be managed with home treatments such as diet and lifestyle changes. If home treatments do not help the symptoms, procedures can be done to shrink or remove the hemorrhoids. What are the causes? This condition is caused by increased pressure in the anal area. This pressure may result from various things, including:  Constipation.  Straining to have a bowel movement.  Diarrhea.  Pregnancy.  Obesity.  Sitting for long periods of time.  Heavy lifting or other activity that causes you to strain.  Anal sex.  Riding a bike for a long period of time. What are the signs or symptoms? Symptoms of this condition include:  Pain.  Anal itching or irritation.  Rectal bleeding.  Leakage of stool  (feces).  Anal swelling.  One or more lumps around the anus. How is this diagnosed? This condition can often be diagnosed through a visual exam. Other exams or tests may also be done, such as:  An exam that involves feeling the rectal area with a gloved hand (digital rectal exam).  An exam of the anal canal that is done using a small tube (anoscope).  A blood test, if you have lost a significant amount of blood.  A test to look inside the colon using a flexible tube with a camera on the end (sigmoidoscopy or colonoscopy). How is this treated? This condition can usually be treated at home. However, various procedures may be done if dietary changes, lifestyle changes, and other home treatments do not help your symptoms. These procedures can help make the hemorrhoids smaller or remove them completely. Some of these procedures involve surgery, and others do not. Common procedures include:  Rubber band ligation. Rubber bands are placed at the base of the hemorrhoids to cut off their blood supply.  Sclerotherapy. Medicine is injected into the hemorrhoids to shrink them.  Infrared coagulation. A type of light energy is used to get rid of the hemorrhoids.  Hemorrhoidectomy surgery. The hemorrhoids are surgically removed, and the veins that supply them are tied off.  Stapled hemorrhoidopexy surgery. The surgeon staples the base of the hemorrhoid to the rectal wall. Follow these instructions at home: Eating and drinking   Eat foods that have a lot  of fiber in them, such as whole grains, beans, nuts, fruits, and vegetables.  Ask your health care provider about taking products that have added fiber (fiber supplements).  Reduce the amount of fat in your diet. You can do this by eating low-fat dairy products, eating less red meat, and avoiding processed foods.  Drink enough fluid to keep your urine pale yellow. Managing pain and swelling   Take warm sitz baths for 20 minutes, 3-4 times a  day to ease pain and discomfort. You may do this in a bathtub or using a portable sitz bath that fits over the toilet.  If directed, apply ice to the affected area. Using ice packs between sitz baths may be helpful. ? Put ice in a plastic bag. ? Place a towel between your skin and the bag. ? Leave the ice on for 20 minutes, 2-3 times a day. General instructions  Take over-the-counter and prescription medicines only as told by your health care provider.  Use medicated creams or suppositories as told.  Get regular exercise. Ask your health care provider how much and what kind of exercise is best for you. In general, you should do moderate exercise for at least 30 minutes on most days of the week (150 minutes each week). This can include activities such as walking, biking, or yoga.  Go to the bathroom when you have the urge to have a bowel movement. Do not wait.  Avoid straining to have bowel movements.  Keep the anal area dry and clean. Use wet toilet paper or moist towelettes after a bowel movement.  Do not sit on the toilet for long periods of time. This increases blood pooling and pain.  Keep all follow-up visits as told by your health care provider. This is important. Contact a health care provider if you have:  Increasing pain and swelling that are not controlled by treatment or medicine.  Difficulty having a bowel movement, or you are unable to have a bowel movement.  Pain or inflammation outside the area of the hemorrhoids. Get help right away if you have:  Uncontrolled bleeding from your rectum. Summary  Hemorrhoids are swollen veins in and around the rectum or anus.  Most hemorrhoids can be managed with home treatments such as diet and lifestyle changes.  Taking warm sitz baths can help ease pain and discomfort.  In severe cases, procedures or surgery can be done to shrink or remove the hemorrhoids. This information is not intended to replace advice given to you by  your health care provider. Make sure you discuss any questions you have with your health care provider. Document Released: 01/22/2000 Document Revised: 02/01/2018 Document Reviewed: 06/15/2017 Elsevier Patient Education  2020 [employer].

## 2018-12-11 NOTE — Progress Notes (Signed)
Referring Provider: Andres Shad, * Primary Care Physician:  Andres Shad, MD Primary GI: Dr. Gala Romney   Chief Complaint  Patient presents with   Gastroesophageal Reflux    better    HPI:   Melissa Schmidt is a 43 y.o. female presenting today with a history of celiac disease, diagnosed in 2017, chronic GERD, RUQ pain.  Prescribed Protonix at last visit in place of Prilosec. Microcytic indices on CBC. Ferritin is normal but low end of normal and down from 3 years ago. Vit B12 and folate are good. Vit D low. Recommend 800 units Vit D daily. She is already taking multivitamin with 1000 iu in it. Will add additional due to known celiac disease. US abdomen with fatty liver. Schmidt DEXA.    Xarelto since August due to DVT. Notes hemorrhoids are bleeding. Chronic issues. Colonoscopy on file from several years ago. Would like to have banded. Hopes to be off Xarelto in next few months. No pain. Declining rectal exam.   RUQ discomfort improved after switching PPIs. Following gluten-free diet.    Past Medical History:  Diagnosis Date   Arthritis    Borderline diabetes    Celiac disease    Complication of anesthesia    shaking post anesthesia   Diabetes mellitus without complication ([facility])    Hemorrhoids    Hypertriglyceridemia    PCOS (polycystic ovarian syndrome)     Past Surgical History:  Procedure Laterality Date   APPENDECTOMY  2002   COLONOSCOPY WITH PROPOFOL N/A 05/25/2015   Dr.Rourk- non-bleeding hemorrhoids, the examination was o/w normal   ESOPHAGEAL DILATION N/A 05/25/2015   Procedure: ESOPHAGEAL DILATION;  Surgeon: Daneil Dolin, MD;  [facility];  Service: Endoscopy;  Laterality: N/A;   ESOPHAGOGASTRODUODENOSCOPY  04/2017   [place]: EGD with irregular Z-line s/p biopsy, benign-appearing esophageal stenosis s/p dilation, normal examined duodenum but no biopsies of duodenum. Pathology with junctional type GE mucosa with chronic  inflammation and surface erosion, no metaplasia, negative dysplasia.    ESOPHAGOGASTRODUODENOSCOPY (EGD) WITH PROPOFOL N/A 05/25/2015   Dr.Rourk- normal esophagus, dilated, gastric erosions, intact fundoplication, normal third portion of the duodenum, non-bleeding erosive gastropathy. duodenum bx= benign small bowel mucosa with patchy increased intraepithelial lymphocytes, mild villous blunting and mild crypt hyperplasia, no dysplasia or malignancy stomach bx= erosive gastritis with reactive changes   NISSEN FUNDOPLICATION  2094    Current Outpatient Medications  Medication Sig Dispense Refill   albuterol (VENTOLIN HFA) 108 (90 Base) MCG/ACT inhaler Inhale 2 puffs into the lungs every 6 (six) hours as needed for wheezing or shortness of breath.     Ascorbic Acid (VITAMIN C ADULT GUMMIES PO) Take by mouth daily.     BREO ELLIPTA 200-25 MCG/INH AEPB Inhale 1 puff into the lungs daily.     ferrous sulfate 325 (65 FE) MG EC tablet Take 325 mg by mouth daily with breakfast.     fluticasone (FLONASE) 50 MCG/ACT nasal spray Place 2 sprays into both nostrils as needed.      glimepiride (AMARYL) 1 MG tablet Take 1 mg by mouth daily.     metFORMIN (GLUCOPHAGE) 1000 MG tablet Take 1,000 mg by mouth 2 (two) times daily with a meal.      Multiple Vitamin (MULTIVITAMIN) capsule Take 1 capsule by mouth daily.     Omega-3 Fatty Acids (OMEGA-3 FISH OIL PO) Take by mouth 3 (three) times daily.      pantoprazole (PROTONIX) 40 MG tablet Take 1 tablet (  40 mg total) by mouth daily. Take 30 minutes before breakfast 30 tablet 3   propranolol (INDERAL) 20 MG tablet Take 1 tablet (20 mg total) by mouth 2 (two) times daily. (Patient taking differently: Take 40 mg by mouth 2 (two) times daily. ) 180 tablet 1   XARELTO 20 MG TABS tablet Take 20 mg by mouth daily.     hydrocortisone (ANUSOL-HC) 2.5 % rectal cream Place 1 application rectally 2 (two) times daily. 30 g 1   Ipratropium-Albuterol (COMBIVENT  RESPIMAT) 20-100 MCG/ACT AERS respimat Inhale 1 puff into the lungs 4 (four) times daily as needed.     omeprazole (PRILOSEC) 20 MG capsule Take 20 mg by mouth daily.  3   No current facility-administered medications for this visit.     Allergies as of 12/11/2018   (No Known Allergies)    Family History  Problem Relation Age of Onset   Irritable bowel syndrome Mother    Inflammatory bowel disease Neg Hx    Colon cancer Neg Hx    Liver disease Neg Hx     Social History   Socioeconomic History   Marital status: Married    Spouse name: Not on file   Number of children: 1   Years of education: Not on file   Highest education level: Not on file  Occupational History   Occupation: in school  Social Schmidt   Financial resource strain: Not on file   Food insecurity    Worry: Not on file    Inability: Not on file   Transportation Schmidt    Medical: Not on file    Non-medical: Not on file  Tobacco Use   Smoking status: Never Smoker   Smokeless tobacco: Never Used  Substance and Sexual Activity   Alcohol use: No    Alcohol/week: 0.0 standard drinks   Drug use: No   Sexual activity: Yes    Birth control/protection: None  Lifestyle   Physical activity    Days per week: Not on file    Minutes per session: Not on file   Stress: Not on file  Relationships   Social connections    Talks on phone: Not on file    Gets together: Not on file    Attends religious service: Not on file    Active member of club or organization: Not on file    Attends meetings of clubs or organizations: Not on file    Relationship status: Not on file  Other Topics Concern   Not on file  Social History Narrative   Not on file    Review of Systems: Gen: Denies fever, chills, anorexia. Denies fatigue, weakness, weight loss.  CV: Denies chest pain, palpitations, syncope, peripheral edema, and claudication. Resp: Denies dyspnea at rest, cough, wheezing, coughing up blood, and  pleurisy. GI: see HPI Derm: Denies rash, itching, dry skin Psych: Denies depression, anxiety, memory loss, confusion. No homicidal or suicidal ideation.  Heme: see HPI  Physical Exam: BP 140/85    Pulse 100    Temp (!) 96.9 F (36.1 C) (Temporal)    Ht 5' 3"  (1.6 m)    Wt 280 lb 12.8 oz (127.4 kg)    LMP 11/26/2018 (Approximate)    BMI 49.74 kg/m  General:   Alert and oriented. No distress noted. Pleasant and cooperative.  Head:  Normocephalic and atraumatic. Abdomen:  +BS, soft, mild TTP RUQ and non-distended. No rebound or guarding. No HSM or masses noted. Rectal: declined Msk:  Symmetrical  without gross deformities. Normal posture. Extremities:  Without edema. Neurologic:  Alert and  oriented x4 Psych:  Alert and cooperative. Normal mood and affect.  ASSESSMENT: Melissa Schmidt is a 43 y.o. female presenting today with a history of celiac disease, RUQ pain, GERD. Since changing Prilosec to Protonix, she has had improvement in GERD and RUQ pain. [place] on file with fatty liver, no stones. Discussed strict adherence to gluten-free diet. No alarm signs/symptoms. She will need to increase daily Vit D due to low at 26 recently.   Rectal bleeding: in setting of hemorrhoids. Declined rectal exam today. Will provide Anusol. She is on Xarelto due to history of DVT in August; hopefully, she will be able to come off of this in the future. DVT likely precipitated in setting of acute illness (COVID), immobility. She is to call if she comes off of this sooner.    PLAN:  Add an additional 800 iu Vit D daily  Recheck Vit D in 3 months.  Gluten-free diet  DEXA scan  Continue Protonix  Call when off Xarelto and will pursue banding  Anusol cream provided  Return in 6 months   Melissa Needs, PhD, [facility]

## 2018-12-12 ENCOUNTER — Other Ambulatory Visit (HOSPITAL_COMMUNITY)
Admission: RE | Admit: 2018-12-12 | Discharge: 2018-12-12 | Disposition: A | Discharge status: Home / Self Care | Payer: BC Managed Care – PPO | Source: Ambulatory Visit | Attending: Diagnostic Radiology | Admitting: Diagnostic Radiology

## 2018-12-12 ENCOUNTER — Encounter (HOSPITAL_COMMUNITY): Payer: Self-pay

## 2018-12-12 ENCOUNTER — Telehealth: Payer: Self-pay

## 2018-12-12 ENCOUNTER — Encounter (HOSPITAL_COMMUNITY)
Admission: RE | Admit: 2018-12-12 | Discharge: 2018-12-12 | Disposition: A | Discharge status: Home / Self Care | Payer: BC Managed Care – PPO | Source: Ambulatory Visit | Attending: "Endocrinology | Admitting: "Endocrinology

## 2018-12-12 ENCOUNTER — Other Ambulatory Visit: Payer: Self-pay

## 2018-12-12 ENCOUNTER — Other Ambulatory Visit (HOSPITAL_COMMUNITY)
Admission: RE | Admit: 2018-12-12 | Discharge: 2018-12-12 | Disposition: A | Discharge status: Home / Self Care | Payer: BC Managed Care – PPO | Source: Ambulatory Visit | Attending: "Endocrinology | Admitting: "Endocrinology

## 2018-12-12 DIAGNOSIS — E0590 Thyrotoxicosis, unspecified without thyrotoxic crisis or storm: Secondary | ICD-10-CM | POA: Diagnosis not present

## 2018-12-12 DIAGNOSIS — K900 Celiac disease: Secondary | ICD-10-CM

## 2018-12-12 LAB — HCG, SERUM, QUALITATIVE: Preg, Serum: NEGATIVE

## 2018-12-12 LAB — T4, FREE: Free T4: 4.42 ng/dL — ABNORMAL HIGH (ref 0.61–1.12)

## 2018-12-12 LAB — TSH: TSH: <0.01 u[IU]/mL — ABNORMAL LOW (ref 0.350–4.500)

## 2018-12-12 MED ORDER — SODIUM IODIDE I 131 CAPSULE
12.0000 | Freq: Once | INTRAVENOUS | Status: AC | PRN
  Administered 2018-12-12: 12.11 via ORAL

## 2018-12-12 NOTE — Telephone Encounter (Signed)
Called [employer] and transferred to NIA. No PA needed for Dexa Scan. Ref# [id number].  DEXA scan scheduled for 12/17/18 at 11:00am, arrive at 10:45am. Do not take any calcium supplements for 48 hours before test. Called and informed pt. Letter mailed.

## 2018-12-13 ENCOUNTER — Other Ambulatory Visit: Payer: Self-pay

## 2018-12-13 DIAGNOSIS — E559 Vitamin D deficiency, unspecified: Secondary | ICD-10-CM

## 2018-12-17 ENCOUNTER — Other Ambulatory Visit (HOSPITAL_COMMUNITY): Payer: BC Managed Care – PPO

## 2018-12-27 ENCOUNTER — Other Ambulatory Visit: Payer: Self-pay

## 2018-12-27 ENCOUNTER — Ambulatory Visit (HOSPITAL_COMMUNITY)
Admission: RE | Admit: 2018-12-27 | Discharge: 2018-12-27 | Disposition: A | Discharge status: Home / Self Care | Payer: BC Managed Care – PPO | Source: Ambulatory Visit | Attending: Gastroenterology | Admitting: Gastroenterology

## 2018-12-27 DIAGNOSIS — K900 Celiac disease: Secondary | ICD-10-CM

## 2019-01-02 NOTE — Progress Notes (Signed)
DEXA scan is normal. Continue her normal supplementation. No osteopenia or osteoporosis.

## 2019-01-14 ENCOUNTER — Telehealth: Payer: Self-pay | Admitting: "Endocrinology

## 2019-01-14 NOTE — Telephone Encounter (Signed)
Pt had questions regarding the weighting period between getting the RAI therapy and when she has repeat TFT labs. I advised her she has an appt at that 8 wk period with labs.

## 2019-01-14 NOTE — Telephone Encounter (Signed)
Pt would like you to call her, she has some questions concerning her radio active treatment

## 2019-01-22 ENCOUNTER — Other Ambulatory Visit: Payer: Self-pay | Admitting: "Endocrinology

## 2019-01-23 LAB — TSH: TSH: <0.005 u[IU]/mL — ABNORMAL LOW (ref 0.450–4.500)

## 2019-01-23 LAB — T4, FREE: Free T4: 4.09 ng/dL — ABNORMAL HIGH (ref 0.82–1.77)

## 2019-01-27 ENCOUNTER — Other Ambulatory Visit: Payer: Self-pay | Admitting: Gastroenterology

## 2019-01-30 ENCOUNTER — Ambulatory Visit (INDEPENDENT_AMBULATORY_CARE_PROVIDER_SITE_OTHER): Payer: BC Managed Care – PPO | Admitting: "Endocrinology

## 2019-01-30 ENCOUNTER — Encounter: Payer: Self-pay | Admitting: "Endocrinology

## 2019-01-30 ENCOUNTER — Other Ambulatory Visit: Payer: Self-pay

## 2019-01-30 VITALS — BP 138/88 | HR 81 | Ht 63.0 in | Wt 276.2 lb

## 2019-01-30 DIAGNOSIS — E0590 Thyrotoxicosis, unspecified without thyrotoxic crisis or storm: Secondary | ICD-10-CM

## 2019-01-30 MED ORDER — PROPRANOLOL HCL 20 MG PO TABS: 20.0000 mg | ORAL_TABLET | Freq: Every day | ORAL | 1 refills | Status: DC

## 2019-01-30 NOTE — Progress Notes (Signed)
01/30/2019              Endocrinology follow-up note     Subjective:    Patient ID: Melissa Schmidt, female    DOB: August 23, 1975, PCP Andres Shad, MD.   Past Medical History:  Diagnosis Date  . Arthritis   . Borderline diabetes   . Celiac disease   . Complication of anesthesia    shaking post anesthesia  . Diabetes mellitus without complication ([place])   . Hemorrhoids   . Hypertriglyceridemia   . PCOS (polycystic ovarian syndrome)    Past Surgical History:  Procedure Laterality Date  . APPENDECTOMY  2002  . COLONOSCOPY WITH PROPOFOL N/A 05/25/2015   Dr.Rourk- non-bleeding hemorrhoids, the examination was o/w normal  . ESOPHAGEAL DILATION N/A 05/25/2015   Procedure: ESOPHAGEAL DILATION;  Surgeon: Daneil Dolin, MD;  [facility];  Service: Endoscopy;  Laterality: N/A;  . ESOPHAGOGASTRODUODENOSCOPY  04/2017   [place]: EGD with irregular Z-line s/p biopsy, benign-appearing esophageal stenosis s/p dilation, normal examined duodenum but no biopsies of duodenum. Pathology with junctional type GE mucosa with chronic inflammation and surface erosion, no metaplasia, negative dysplasia.   Marland Kitchen ESOPHAGOGASTRODUODENOSCOPY (EGD) WITH PROPOFOL N/A 05/25/2015   Dr.Rourk- normal esophagus, dilated, gastric erosions, intact fundoplication, normal third portion of the duodenum, non-bleeding erosive gastropathy. duodenum bx= benign small bowel mucosa with patchy increased intraepithelial lymphocytes, mild villous blunting and mild crypt hyperplasia, no dysplasia or malignancy stomach bx= erosive gastritis with reactive changes  . NISSEN FUNDOPLICATION  2409   Social History   Socioeconomic History  . Marital status: Married    Spouse name: Not on file  . Number of children: 1  . Years of education: Not on file  . Highest education level: Not on file  Occupational History  . Occupation: in school  Tobacco Use  . Smoking status: Never Smoker  . Smokeless tobacco: Never Used   Substance and Sexual Activity  . Alcohol use: No    Alcohol/week: 0.0 standard drinks  . Drug use: No  . Sexual activity: Yes    Birth control/protection: None  Other Topics Concern  . Not on file  Social History Narrative  . Not on file   Social Determinants of Health   Financial Resource Strain:   . Difficulty of Paying Living Expenses: Not on file  Food Insecurity:   . Worried About Charity fundraiser in the Last Year: Not on file  . Ran Out of Food in the Last Year: Not on file  Transportation Needs:   . Lack of Transportation (Medical): Not on file  . Lack of Transportation (Non-Medical): Not on file  Physical Activity:   . Days of Exercise per Week: Not on file  . Minutes of Exercise per Session: Not on file  Stress:   . Feeling of Stress : Not on file  Social Connections:   . Frequency of Communication with Friends and Family: Not on file  . Frequency of Social Gatherings with Friends and Family: Not on file  . Attends Religious Services: Not on file  . Active Member of Clubs or Organizations: Not on file  . Attends Archivist Meetings: Not on file  . Marital Status: Not on file   Outpatient Encounter Medications as of 01/30/2019  Medication Sig  . azelastine (ASTELIN) 0.1 % nasal spray Place 1 spray into both nostrils 2 (two) times daily. Use in each nostril as directed  . montelukast (SINGULAIR) 10 MG tablet Take  10 mg by mouth at bedtime.  Marland Kitchen albuterol (VENTOLIN HFA) 108 (90 Base) MCG/ACT inhaler Inhale 2 puffs into the lungs every 6 (six) hours as needed for wheezing or shortness of breath.  . Ascorbic Acid (VITAMIN C ADULT GUMMIES PO) Take by mouth daily.  Marland Kitchen BREO ELLIPTA 200-25 MCG/INH AEPB Inhale 1 puff into the lungs daily.  . ferrous sulfate 325 (65 FE) MG EC tablet Take 325 mg by mouth daily with breakfast.  . fluticasone (FLONASE) 50 MCG/ACT nasal spray Place 2 sprays into both nostrils as needed.   Marland Kitchen glimepiride (AMARYL) 1 MG tablet Take 1 mg  by mouth daily.  . hydrocortisone (ANUSOL-HC) 2.5 % rectal cream Place 1 application rectally 2 (two) times daily.  . metFORMIN (GLUCOPHAGE) 1000 MG tablet Take 1,000 mg by mouth 2 (two) times daily with a meal.   . Multiple Vitamin (MULTIVITAMIN) capsule Take 1 capsule by mouth daily.  . Omega-3 Fatty Acids (OMEGA-3 FISH OIL PO) Take by mouth 3 (three) times daily.   . pantoprazole (PROTONIX) 40 MG tablet TAKE 1 TABLET BY MOUTH ONCE DAILY 30  MINUTES  BEFORE  BREAKFAST  . propranolol (INDERAL) 20 MG tablet Take 1 tablet (20 mg total) by mouth daily.  Alveda Reasons 20 MG TABS tablet Take 20 mg by mouth daily.  . [DISCONTINUED] Ipratropium-Albuterol (COMBIVENT RESPIMAT) 20-100 MCG/ACT AERS respimat Inhale 1 puff into the lungs 4 (four) times daily as needed.  . [DISCONTINUED] omeprazole (PRILOSEC) 20 MG capsule Take 20 mg by mouth daily.  . [DISCONTINUED] propranolol (INDERAL) 20 MG tablet Take 1 tablet (20 mg total) by mouth 2 (two) times daily. (Patient taking differently: Take 40 mg by mouth 2 (two) times daily. )   No facility-administered encounter medications on file as of 01/30/2019.    ALLERGIES: No Known Allergies  VACCINATION STATUS:  There is no immunization history on file for this patient.   HPI  Melissa Schmidt is 43 y.o. female who presents today with a medical history as above. she is being seen in follow-up after she received radioactive iodine treatment for hyperthyroidism  -After only partial response to chronic methimazole treatment, she was given I-131 thyroid ablation treatment on December 12, 2018 after her thyroid uptake showed 68% in 24 hours 24 minutes suggesting Graves' disease.  She is currently off of methimazole taking only propanolol 20 mg p.o. daily.  She continues to feel better, her previsit labs still show hyperthyroidism. She  lost 65 pounds over 6 months prior to initiation of treatment.  She has no new complaints today.  She was recently treated for Covid  pneumonia. she denies family history of thyroid dysfunction nor any thyroid cancer.                            Review of systems  Limited as above  Objective:    BP 138/88   Pulse 81   Ht 5' 3"  (1.6 m)   Wt 276 lb 3.2 oz (125.3 kg)   BMI 48.93 kg/m   Wt Readings from Last 3 Encounters:  01/30/19 276 lb 3.2 oz (125.3 kg)  12/11/18 280 lb 12.8 oz (127.4 kg)  10/09/18 289 lb 6.4 oz (131.3 kg)                     Component Value Date/Time   NA 140 11/05/2018 1551   K 4.6 11/05/2018 1551   CL 103 11/05/2018 1551  CO2 25 11/05/2018 1551   GLUCOSE 55 (L) 11/05/2018 1551   GLUCOSE 163 (H) 05/21/2015 1010   BUN 12 11/05/2018 1551   CREATININE 0.71 11/05/2018 1551   CALCIUM 9.9 11/05/2018 1551   PROT 6.8 11/05/2018 1551   ALBUMIN 4.2 11/05/2018 1551   AST 16 11/05/2018 1551   ALT 22 11/05/2018 1551   ALKPHOS 89 11/05/2018 1551   BILITOT <0.2 11/05/2018 1551   GFRNONAA 106 11/05/2018 1551   GFRAA 122 11/05/2018 1551     CBC    Component Value Date/Time   WBC 10.8 11/05/2018 1551   WBC 9.6 05/21/2015 1010   RBC 5.18 11/05/2018 1551   RBC 4.41 05/21/2015 1010   HGB 11.9 11/05/2018 1551   HCT 38.3 11/05/2018 1551   PLT 428 11/05/2018 1551   MCV 74 (L) 11/05/2018 1551   MCH 23.0 (L) 11/05/2018 1551   MCH 25.4 (L) 05/21/2015 1010   MCHC 31.1 (L) 11/05/2018 1551   MCHC 31.5 05/21/2015 1010   RDW 15.5 (H) 11/05/2018 1551   LYMPHSABS 3.3 (H) 11/05/2018 1551   EOSABS 0.2 11/05/2018 1551   BASOSABS 0.0 11/05/2018 1551     Diabetic Labs (most recent): Lab Results  Component Value Date   HGBA1C 5.9 07/31/2017    Lipid Panel     Component Value Date/Time   CHOL 150 12/13/2016 0000   TRIG 172 (A) 12/13/2016 0000   HDL 41 12/13/2016 0000   LDLCALC 75 12/13/2016 0000     Lab Results  Component Value Date   TSH <0.005 (L) 01/22/2019   TSH <0.010 (L) 12/12/2018   TSH <0.005 (L) 11/08/2018   TSH <0.005 (L) 09/21/2018   TSH 0.006 (L) 07/11/2018   TSH 0.007  (L) 01/29/2018   TSH <0.006 (L) 11/29/2017   TSH 0.01 (A) 07/31/2017   FREET4 4.09 (H) 01/22/2019   FREET4 4.42 (H) 12/12/2018   FREET4 1.93 (H) 11/08/2018   FREET4 1.89 (H) 09/21/2018   FREET4 2.21 (H) 07/11/2018   FREET4 1.99 (H) 01/29/2018   FREET4 3.61 (H) 11/29/2017    Thyroid uptake and scan on October 20, 20 FINDINGS: Homogeneous tracer accumulation within both thyroid lobes, diffusely increased.  No focal areas of increased or decreased tracer localization.  4 hour I-123 uptake = 57% (normal 5-20%)  24 hour I-123 uptake = 68% (normal 10-30%)  IMPRESSION: Diffusely increased homogeneous tracer accumulation in both thyroid lobes.  Increased 4 hour and 24 hour radio iodine uptakes consistent with hyperthyroidism.  Findings consistent with Graves disease.  Assessment & Plan:   1. Hyperthyroidism due to Graves' disease -She is status post I-131 thyroid ablative treatment on December 12, 2018.  Her January 22, 2019 labs show some treatment effect but she is not hypothyroid yet. -it Is unlikely to be a treatment failure, more likely late response in her care. -Previsit thyroid uptake and scan confirmed hyperthyroidism from Graves' disease.   She would not be initiated on thyroid hormone yet, nor is she be continued on methimazole.  She is advised to lower her neck propanolol to 20 mg p.o. daily. We will have repeat thyroid function test in 7 weeks with office visit in 8 weeks. She continues to have clinically significant primary hypothyroidism, she will be considered for retreatment with I-131. She understands the subsequent need for thyroid hormone replacement.  -Regarding her type 2 diabetes, her A1c is 6.1%.  She is advised to continue metformin 1000 mg p.o. twice daily.  She will have A1c during her  next visit.   - I advised her to maintain close follow up with Andres Shad, MD for primary care needs.   Time for this visit: 15 minutes. Nicoletta Hush Hopes   participated in the discussions, expressed understanding, and voiced agreement with the above plans.  All questions were answered to her satisfaction. she is encouraged to contact clinic should she have any questions or concerns prior to her return visit.   Follow up plan: Return in about 8 weeks (around 03/27/2019) for Follow up with Pre-visit Labs.   Thank you for involving me in the care of this pleasant patient, and I will continue to update you with her progress.  Glade Lloyd, MD [facility] [place] [facility] Phone: [phone]  Fax: [phone]   01/30/2019, 5:23 PM  This note was partially dictated with voice recognition software. Similar sounding words can be transcribed inadequately or may not  be corrected upon review.

## 2019-01-31 ENCOUNTER — Ambulatory Visit: Payer: BC Managed Care – PPO | Admitting: "Endocrinology

## 2019-03-05 ENCOUNTER — Other Ambulatory Visit: Payer: Self-pay

## 2019-03-05 DIAGNOSIS — E559 Vitamin D deficiency, unspecified: Secondary | ICD-10-CM

## 2019-03-21 ENCOUNTER — Ambulatory Visit: Payer: BC Managed Care – PPO | Admitting: "Endocrinology

## 2019-03-21 ENCOUNTER — Other Ambulatory Visit: Payer: Self-pay | Admitting: "Endocrinology

## 2019-03-21 LAB — TSH: TSH: 13.9 — AB (ref 0.41–5.90)

## 2019-03-22 LAB — T4, FREE: Free T4: 0.4 ng/dL — ABNORMAL LOW (ref 0.82–1.77)

## 2019-03-22 LAB — T3, FREE: T3, Free: 2.1 pg/mL (ref 2.0–4.4)

## 2019-03-22 LAB — TSH: TSH: 13.9 u[IU]/mL — ABNORMAL HIGH (ref 0.450–4.500)

## 2019-03-22 LAB — VITAMIN D 25 HYDROXY (VIT D DEFICIENCY, FRACTURES): Vit D, 25-Hydroxy: 24.3 ng/mL — ABNORMAL LOW (ref 30.0–100.0)

## 2019-03-25 ENCOUNTER — Ambulatory Visit (INDEPENDENT_AMBULATORY_CARE_PROVIDER_SITE_OTHER): Payer: BC Managed Care – PPO | Admitting: "Endocrinology

## 2019-03-25 ENCOUNTER — Encounter: Payer: Self-pay | Admitting: "Endocrinology

## 2019-03-25 DIAGNOSIS — E890 Postprocedural hypothyroidism: Secondary | ICD-10-CM | POA: Diagnosis not present

## 2019-03-25 MED ORDER — LEVOTHYROXINE SODIUM 150 MCG PO TABS: 150.0000 ug | ORAL_TABLET | Freq: Every day | ORAL | 3 refills | Status: DC

## 2019-03-25 NOTE — Patient Instructions (Signed)

## 2019-03-25 NOTE — Progress Notes (Signed)
03/25/2019                                    Endocrinology Telehealth Visit Follow up Note -During COVID -19 Pandemic  I connected with [name] on 03/25/2019   by telephone and verified that I am speaking with the correct person using two identifiers. [name], October 07, 1975. she has verbally consented to this visit. All issues noted in this document were discussed and addressed. The format was not optimal for physical exam.   Subjective:    Patient ID: [name], female    DOB: 10-16-1975, PCP Andres Shad, MD.   Past Medical History:  Diagnosis Date  . Arthritis   . Borderline diabetes   . Celiac disease   . Complication of anesthesia    shaking post anesthesia  . Diabetes mellitus without complication ([place])   . Hemorrhoids   . Hypertriglyceridemia   . PCOS (polycystic ovarian syndrome)    Past Surgical History:  Procedure Laterality Date  . APPENDECTOMY  2002  . COLONOSCOPY WITH PROPOFOL N/A 05/25/2015   Dr.Rourk- non-bleeding hemorrhoids, the examination was o/w normal  . ESOPHAGEAL DILATION N/A 05/25/2015   Procedure: ESOPHAGEAL DILATION;  Surgeon: Daneil Dolin, MD;  [facility];  Service: Endoscopy;  Laterality: N/A;  . ESOPHAGOGASTRODUODENOSCOPY  04/2017   [place]: EGD with irregular Z-line s/p biopsy, benign-appearing esophageal stenosis s/p dilation, normal examined duodenum but no biopsies of duodenum. Pathology with junctional type GE mucosa with chronic inflammation and surface erosion, no metaplasia, negative dysplasia.   Marland Kitchen ESOPHAGOGASTRODUODENOSCOPY (EGD) WITH PROPOFOL N/A 05/25/2015   Dr.Rourk- normal esophagus, dilated, gastric erosions, intact fundoplication, normal third portion of the duodenum, non-bleeding erosive gastropathy. duodenum bx= benign small bowel mucosa with patchy increased intraepithelial lymphocytes, mild villous blunting and mild crypt hyperplasia, no dysplasia or malignancy stomach bx= erosive gastritis with reactive  changes  . NISSEN FUNDOPLICATION  0109   Social History   Socioeconomic History  . Marital status: Married    Spouse name: Not on file  . Number of children: 1  . Years of education: Not on file  . Highest education level: Not on file  Occupational History  . Occupation: in school  Tobacco Use  . Smoking status: Never Smoker  . Smokeless tobacco: Never Used  Substance and Sexual Activity  . Alcohol use: No    Alcohol/week: 0.0 standard drinks  . Drug use: No  . Sexual activity: Yes    Birth control/protection: None  Other Topics Concern  . Not on file  Social History Narrative  . Not on file   Social Determinants of Health   Financial Resource Strain:   . Difficulty of Paying Living Expenses: Not on file  Food Insecurity:   . Worried About Charity fundraiser in the Last Year: Not on file  . Ran Out of Food in the Last Year: Not on file  Transportation Needs:   . Lack of Transportation (Medical): Not on file  . Lack of Transportation (Non-Medical): Not on file  Physical Activity:   . Days of Exercise per Week: Not on file  . Minutes of Exercise per Session: Not on file  Stress:   . Feeling of Stress : Not on file  Social Connections:   . Frequency of Communication with Friends and Family: Not on file  . Frequency of Social Gatherings with Friends and Family:  Not on file  . Attends Religious Services: Not on file  . Active Member of Clubs or Organizations: Not on file  . Attends Archivist Meetings: Not on file  . Marital Status: Not on file   Outpatient Encounter Medications as of 03/25/2019  Medication Sig  . albuterol (VENTOLIN HFA) 108 (90 Base) MCG/ACT inhaler Inhale 2 puffs into the lungs every 6 (six) hours as needed for wheezing or shortness of breath.  . Ascorbic Acid (VITAMIN C ADULT GUMMIES PO) Take by mouth daily.  Marland Kitchen azelastine (ASTELIN) 0.1 % nasal spray Place 1 spray into both nostrils 2 (two) times daily. Use in each nostril as directed  .  BREO ELLIPTA 200-25 MCG/INH AEPB Inhale 1 puff into the lungs daily.  . ferrous sulfate 325 (65 FE) MG EC tablet Take 325 mg by mouth daily with breakfast.  . fluticasone (FLONASE) 50 MCG/ACT nasal spray Place 2 sprays into both nostrils as needed.   Marland Kitchen glimepiride (AMARYL) 1 MG tablet Take 1 mg by mouth daily.  . hydrocortisone (ANUSOL-HC) 2.5 % rectal cream Place 1 application rectally 2 (two) times daily.  Marland Kitchen levothyroxine (SYNTHROID) 150 MCG tablet Take 1 tablet (150 mcg total) by mouth daily before breakfast.  . metFORMIN (GLUCOPHAGE) 1000 MG tablet Take 1,000 mg by mouth 2 (two) times daily with a meal.   . montelukast (SINGULAIR) 10 MG tablet Take 10 mg by mouth at bedtime.  . Multiple Vitamin (MULTIVITAMIN) capsule Take 1 capsule by mouth daily.  . Omega-3 Fatty Acids (OMEGA-3 FISH OIL PO) Take by mouth 3 (three) times daily.   . pantoprazole (PROTONIX) 40 MG tablet TAKE 1 TABLET BY MOUTH ONCE DAILY 30  MINUTES  BEFORE  BREAKFAST  . XARELTO 20 MG TABS tablet Take 20 mg by mouth daily.  . [DISCONTINUED] propranolol (INDERAL) 20 MG tablet Take 1 tablet (20 mg total) by mouth daily.   No facility-administered encounter medications on file as of 03/25/2019.    ALLERGIES: No Known Allergies  VACCINATION STATUS:  There is no immunization history on file for this patient.   HPI  ALAIJA RUBLE is 44 y.o. female who is being engaged in telehealth via telephone for follow-up.  She was given I-131 thyroid ablation for clinically significant primary hyperthyroidism from Graves' disease.   -This treatment was administered on December 12, 2018 after her thyroid uptake showed 68% in 24 hours 24 minutes suggesting Graves' disease.  She is currently on low-dose propranolol.  She reports weight gain of approximately 10 pounds, feeling cold intolerance, and fatigue.   Her previsit labs are consistent with treatment effect   She  lost 65 pounds over 6 months prior to initiation of treatment.  She was  recently treated for Covid pneumonia. she denies family history of thyroid dysfunction nor any thyroid cancer.                            Review of systems  Limited as above  Objective:    There were no vitals taken for this visit.  Wt Readings from Last 3 Encounters:  01/30/19 276 lb 3.2 oz (125.3 kg)  12/11/18 280 lb 12.8 oz (127.4 kg)  10/09/18 289 lb 6.4 oz (131.3 kg)                     Component Value Date/Time   NA 140 11/05/2018 1551   K 4.6 11/05/2018 1551   CL  103 11/05/2018 1551   CO2 25 11/05/2018 1551   GLUCOSE 55 (L) 11/05/2018 1551   GLUCOSE 163 (H) 05/21/2015 1010   BUN 12 11/05/2018 1551   CREATININE 0.71 11/05/2018 1551   CALCIUM 9.9 11/05/2018 1551   PROT 6.8 11/05/2018 1551   ALBUMIN 4.2 11/05/2018 1551   AST 16 11/05/2018 1551   ALT 22 11/05/2018 1551   ALKPHOS 89 11/05/2018 1551   BILITOT <0.2 11/05/2018 1551   GFRNONAA 106 11/05/2018 1551   GFRAA 122 11/05/2018 1551     CBC    Component Value Date/Time   WBC 10.8 11/05/2018 1551   WBC 9.6 05/21/2015 1010   RBC 5.18 11/05/2018 1551   RBC 4.41 05/21/2015 1010   HGB 11.9 11/05/2018 1551   HCT 38.3 11/05/2018 1551   PLT 428 11/05/2018 1551   MCV 74 (L) 11/05/2018 1551   MCH 23.0 (L) 11/05/2018 1551   MCH 25.4 (L) 05/21/2015 1010   MCHC 31.1 (L) 11/05/2018 1551   MCHC 31.5 05/21/2015 1010   RDW 15.5 (H) 11/05/2018 1551   LYMPHSABS 3.3 (H) 11/05/2018 1551   EOSABS 0.2 11/05/2018 1551   BASOSABS 0.0 11/05/2018 1551     Diabetic Labs (most recent): Lab Results  Component Value Date   HGBA1C 5.9 07/31/2017    Lipid Panel     Component Value Date/Time   CHOL 150 12/13/2016 0000   TRIG 172 (A) 12/13/2016 0000   HDL 41 12/13/2016 0000   LDLCALC 75 12/13/2016 0000    Results for BLYTHE, VEACH (MRN [id number]) as of 03/25/2019 14:13  Ref. Range 01/22/2019 12:29 03/21/2019 00:00 03/21/2019 15:23 03/21/2019 15:24  TSH Latest Ref Range: 0.450 - 4.500 uIU/mL <0.005 (L) 13.90 (A)  13.900  (H)  Triiodothyronine,Free,Serum Latest Ref Range: 2.0 - 4.4 pg/mL    2.1  T4,Free(Direct) Latest Ref Range: 0.82 - 1.77 ng/dL 4.09 (H)   0.40 (L)     Thyroid uptake and scan on October 20, 20 FINDINGS: Homogeneous tracer accumulation within both thyroid lobes, diffusely increased.  No focal areas of increased or decreased tracer localization. 4 hour I-123 uptake = 57% (normal 5-20%) 24 hour I-123 uptake = 68% (normal 10-30%)  IMPRESSION: Diffusely increased homogeneous tracer accumulation in both thyroid lobes.  Increased 4 hour and 24 hour radio iodine uptakes consistent with hyperthyroidism. Findings consistent with Graves disease.  I-131 thyroid ablation on December 12, 2018    Assessment & Plan:   1.  RAI induced hypothyroidism  2. hyperthyroidism due to Graves' disease- resolved -She is status post I-131 thyroid ablative treatment on December 12, 2018.  Her previsit thyroid function tests are consistent with treatment effect. -I discussed and initiated levothyroxine treatment for her.  -She will be initiated on levothyroxine 150 mcg p.o. daily before breakfast.     - We discussed about the correct intake of her thyroid hormone, on empty stomach at fasting, with water, separated by at least 30 minutes from breakfast and other medications,  and separated by more than 4 hours from calcium, iron, multivitamins, acid reflux medications (PPIs). -Patient is made aware of the fact that thyroid hormone replacement is needed for life, dose to be adjusted by periodic monitoring of thyroid function tests.  She is advised to discontinue propranolol at this time.  -Regarding her type 2 diabetes, her A1c is 6.1%.  She is advised to continue metformin 1000 mg p.o. twice daily.  She will have A1c during her next visit.   - I advised her  to maintain close follow up with Andres Shad, MD for primary care needs.     - Time spent on this patient care encounter:  25 minutes  of which 50% was spent in  counseling and the rest reviewing  her current and  previous labs / studies and medications  doses and developing a plan for long term care. Leatrice Parilla Bruinsma  participated in the discussions, expressed understanding, and voiced agreement with the above plans.  All questions were answered to her satisfaction. she is encouraged to contact clinic should she have any questions or concerns prior to her return visit.   Follow up plan: Return in about 9 weeks (around 05/27/2019) for Follow up with Pre-visit Labs.   Thank you for involving me in the care of this pleasant patient, and I will continue to update you with her progress.  Glade Lloyd, MD [facility] [place] [facility] Phone: [phone]  Fax: [phone]   03/25/2019, 2:10 PM  This note was partially dictated with voice recognition software. Similar sounding words can be transcribed inadequately or may not  be corrected upon review.

## 2019-04-10 ENCOUNTER — Ambulatory Visit: Payer: BC Managed Care – PPO | Admitting: Urology

## 2019-05-16 ENCOUNTER — Other Ambulatory Visit: Payer: Self-pay | Admitting: *Deleted

## 2019-05-16 DIAGNOSIS — E559 Vitamin D deficiency, unspecified: Secondary | ICD-10-CM

## 2019-05-28 ENCOUNTER — Other Ambulatory Visit: Payer: Self-pay | Admitting: "Endocrinology

## 2019-05-28 ENCOUNTER — Ambulatory Visit: Payer: BC Managed Care – PPO | Admitting: "Endocrinology

## 2019-05-29 LAB — T4, FREE: Free T4: 1.99 ng/dL — ABNORMAL HIGH (ref 0.82–1.77)

## 2019-05-29 LAB — TSH: TSH: <0.005 u[IU]/mL — ABNORMAL LOW (ref 0.450–4.500)

## 2019-05-31 ENCOUNTER — Encounter: Payer: Self-pay | Admitting: "Endocrinology

## 2019-05-31 ENCOUNTER — Ambulatory Visit (INDEPENDENT_AMBULATORY_CARE_PROVIDER_SITE_OTHER): Payer: BC Managed Care – PPO | Admitting: "Endocrinology

## 2019-05-31 ENCOUNTER — Other Ambulatory Visit: Payer: Self-pay

## 2019-05-31 VITALS — BP 116/68 | HR 88 | Ht 63.0 in | Wt 292.2 lb

## 2019-05-31 DIAGNOSIS — E890 Postprocedural hypothyroidism: Secondary | ICD-10-CM

## 2019-05-31 MED ORDER — LEVOTHYROXINE SODIUM 137 MCG PO TABS: 137.0000 ug | ORAL_TABLET | Freq: Every day | ORAL | 3 refills | Status: DC

## 2019-05-31 NOTE — Progress Notes (Signed)
05/31/2019          Endocrinology follow-up note   Subjective:    Patient ID: Melissa Schmidt, female    DOB: [birth year], PCP Andres Shad, MD.   Past Medical History:  Diagnosis Date  . Arthritis   . Borderline diabetes   . Celiac disease   . Complication of anesthesia    shaking post anesthesia  . Diabetes mellitus without complication ([place])   . Hemorrhoids   . Hypertriglyceridemia   . PCOS (polycystic ovarian syndrome)    Past Surgical History:  Procedure Laterality Date  . APPENDECTOMY  2002  . COLONOSCOPY WITH PROPOFOL N/A 05/25/2015   Dr.Rourk- non-bleeding hemorrhoids, the examination was o/w normal  . ESOPHAGEAL DILATION N/A 05/25/2015   Procedure: ESOPHAGEAL DILATION;  Surgeon: Daneil Dolin, MD;  [facility];  Service: Endoscopy;  Laterality: N/A;  . ESOPHAGOGASTRODUODENOSCOPY  04/2017   [place]: EGD with irregular Z-line s/p biopsy, benign-appearing esophageal stenosis s/p dilation, normal examined duodenum but no biopsies of duodenum. Pathology with junctional type GE mucosa with chronic inflammation and surface erosion, no metaplasia, negative dysplasia.   Marland Kitchen ESOPHAGOGASTRODUODENOSCOPY (EGD) WITH PROPOFOL N/A 05/25/2015   Dr.Rourk- normal esophagus, dilated, gastric erosions, intact fundoplication, normal third portion of the duodenum, non-bleeding erosive gastropathy. duodenum bx= benign small bowel mucosa with patchy increased intraepithelial lymphocytes, mild villous blunting and mild crypt hyperplasia, no dysplasia or malignancy stomach bx= erosive gastritis with reactive changes  . NISSEN FUNDOPLICATION  4034   Social History   Socioeconomic History  . Marital status: Married    Spouse name: Not on file  . Number of children: 1  . Years of education: Not on file  . Highest education level: Not on file  Occupational History  . Occupation: in school  Tobacco Use  . Smoking status: Never Smoker  . Smokeless tobacco: Never Used   Substance and Sexual Activity  . Alcohol use: No    Alcohol/week: 0.0 standard drinks  . Drug use: No  . Sexual activity: Yes    Birth control/protection: None  Other Topics Concern  . Not on file  Social History Narrative  . Not on file   Social Determinants of Health   Financial Resource Strain:   . Difficulty of Paying Living Expenses:   Food Insecurity:   . Worried About Charity fundraiser in the Last Year:   . Arboriculturist in the Last Year:   Transportation Needs:   . Film/video editor (Medical):   Marland Kitchen Lack of Transportation (Non-Medical):   Physical Activity:   . Days of Exercise per Week:   . Minutes of Exercise per Session:   Stress:   . Feeling of Stress :   Social Connections:   . Frequency of Communication with Friends and Family:   . Frequency of Social Gatherings with Friends and Family:   . Attends Religious Services:   . Active Member of Clubs or Organizations:   . Attends Archivist Meetings:   Marland Kitchen Marital Status:    Outpatient Encounter Medications as of 05/31/2019  Medication Sig  . albuterol (VENTOLIN HFA) 108 (90 Base) MCG/ACT inhaler Inhale 2 puffs into the lungs every 6 (six) hours as needed for wheezing or shortness of breath.  . Ascorbic Acid (VITAMIN C ADULT GUMMIES PO) Take by mouth daily.  Marland Kitchen azelastine (ASTELIN) 0.1 % nasal spray Place 1 spray into both nostrils 2 (two) times daily. Use in each nostril as directed  .  BREO ELLIPTA 200-25 MCG/INH AEPB Inhale 1 puff into the lungs daily.  . ferrous sulfate 325 (65 FE) MG EC tablet Take 325 mg by mouth daily with breakfast.  . fluticasone (FLONASE) 50 MCG/ACT nasal spray Place 2 sprays into both nostrils as needed.   Marland Kitchen glimepiride (AMARYL) 1 MG tablet Take 1 mg by mouth daily.  . hydrocortisone (ANUSOL-HC) 2.5 % rectal cream Place 1 application rectally 2 (two) times daily.  Marland Kitchen levothyroxine (SYNTHROID) 137 MCG tablet Take 1 tablet (137 mcg total) by mouth daily before breakfast.  .  metFORMIN (GLUCOPHAGE) 1000 MG tablet Take 1,000 mg by mouth 2 (two) times daily with a meal.   . Multiple Vitamin (MULTIVITAMIN) capsule Take 1 capsule by mouth daily.  . Omega-3 Fatty Acids (OMEGA-3 FISH OIL PO) Take by mouth 3 (three) times daily.   . pantoprazole (PROTONIX) 40 MG tablet TAKE 1 TABLET BY MOUTH ONCE DAILY 30  MINUTES  BEFORE  BREAKFAST  . [DISCONTINUED] levothyroxine (SYNTHROID) 150 MCG tablet Take 1 tablet (150 mcg total) by mouth daily before breakfast.  . [DISCONTINUED] montelukast (SINGULAIR) 10 MG tablet Take 10 mg by mouth at bedtime.  . [DISCONTINUED] XARELTO 20 MG TABS tablet Take 20 mg by mouth daily.   No facility-administered encounter medications on file as of 05/31/2019.    ALLERGIES: Allergies  Allergen Reactions  . Singulair [Montelukast]     VACCINATION STATUS:  There is no immunization history on file for this patient.   HPI  Melissa Schmidt is 44 y.o. female who is being seen in follow-up for her history of RAI induced hypothyroidism.   She was given I-131 thyroid ablation for clinically significant primary hyperthyroidism from Graves' disease.   -This treatment was administered on December 12, 2018 after her thyroid uptake showed 68% in 24 hours 24 minutes suggesting Graves' disease.  She was initiated on levothyroxine 150 mcg p.o. daily during her last visit.  She continues to feel better, however her previsit labs are consistent with slight over replacement.   -She continues to gain weight.  She denies palpitations, tremors, nor heat intolerance. She  lost 65 pounds over 6 months prior to initiation of treatment.  She was recently treated for Covid pneumonia. she denies family history of thyroid dysfunction nor any thyroid cancer.                            Review of systems  Limited as above  Objective:    BP 116/68   Pulse 88   Ht 5' 3"  (1.6 m)   Wt 292 lb 3.2 oz (132.5 kg)   BMI 51.76 kg/m   Wt Readings from Last 3 Encounters:   05/31/19 292 lb 3.2 oz (132.5 kg)  01/30/19 276 lb 3.2 oz (125.3 kg)  12/11/18 280 lb 12.8 oz (127.4 kg)       Physical Exam- Limited  Constitutional:  Body mass index is 51.76 kg/m. , not in acute distress, normal state of mind Eyes:  EOMI, no exophthalmos Neck: Supple Thyroid: + Mild goiter.  Respiratory: Adequate breathing efforts Musculoskeletal: no gross deformities, strength intact in all four extremities, no gross restriction of joint movements Skin:  no rashes, no hyperemia Neurological: no tremor with outstretched hands,                 Component Value Date/Time   NA 140 11/05/2018 1551   K 4.6 11/05/2018 1551   CL 103 11/05/2018  1551   CO2 25 11/05/2018 1551   GLUCOSE 55 (L) 11/05/2018 1551   GLUCOSE 163 (H) 05/21/2015 1010   BUN 12 11/05/2018 1551   CREATININE 0.71 11/05/2018 1551   CALCIUM 9.9 11/05/2018 1551   PROT 6.8 11/05/2018 1551   ALBUMIN 4.2 11/05/2018 1551   AST 16 11/05/2018 1551   ALT 22 11/05/2018 1551   ALKPHOS 89 11/05/2018 1551   BILITOT <0.2 11/05/2018 1551   GFRNONAA 106 11/05/2018 1551   GFRAA 122 11/05/2018 1551     CBC    Component Value Date/Time   WBC 10.8 11/05/2018 1551   WBC 9.6 05/21/2015 1010   RBC 5.18 11/05/2018 1551   RBC 4.41 05/21/2015 1010   HGB 11.9 11/05/2018 1551   HCT 38.3 11/05/2018 1551   PLT 428 11/05/2018 1551   MCV 74 (L) 11/05/2018 1551   MCH 23.0 (L) 11/05/2018 1551   MCH 25.4 (L) 05/21/2015 1010   MCHC 31.1 (L) 11/05/2018 1551   MCHC 31.5 05/21/2015 1010   RDW 15.5 (H) 11/05/2018 1551   LYMPHSABS 3.3 (H) 11/05/2018 1551   EOSABS 0.2 11/05/2018 1551   BASOSABS 0.0 11/05/2018 1551     Diabetic Labs (most recent): Lab Results  Component Value Date   HGBA1C 5.9 07/31/2017    Lipid Panel     Component Value Date/Time   CHOL 150 12/13/2016 0000   TRIG 172 (A) 12/13/2016 0000   HDL 41 12/13/2016 0000   LDLCALC 75 12/13/2016 0000     Thyroid uptake and scan on October 20,  20 FINDINGS: Homogeneous tracer accumulation within both thyroid lobes, diffusely increased.  No focal areas of increased or decreased tracer localization. 4 hour I-123 uptake = 57% (normal 5-20%) 24 hour I-123 uptake = 68% (normal 10-30%)  IMPRESSION: Diffusely increased homogeneous tracer accumulation in both thyroid lobes.  Increased 4 hour and 24 hour radio iodine uptakes consistent with hyperthyroidism. Findings consistent with Graves disease.  I-131 thyroid ablation on December 12, 2018   Recent Results (from the past 2160 hour(s))  TSH     Status: Abnormal   Collection Time: 03/21/19 12:00 AM  Result Value Ref Range   TSH 13.90 (A) 0.41 - 5.90    Comment: free t4 0.4, ft3 2.1  Vitamin D (25 hydroxy)     Status: Abnormal   Collection Time: 03/21/19  3:23 PM  Result Value Ref Range   Vit D, 25-Hydroxy 24.3 (L) 30.0 - 100.0 ng/mL    Comment: Vitamin D deficiency has been defined by the [place] and an Endocrine Society practice guideline as a level of serum 25-OH vitamin D less than 20 ng/mL (1,2). The Endocrine Society went on to further define vitamin D insufficiency as a level between 21 and 29 ng/mL (2). 1. IOM (Institute of Medicine). 2010. Dietary reference    intakes for calcium and D. [place]: The    [employer]. 2. Holick MF, Binkley Vanduser, Bischoff-Ferrari HA, et al.    Evaluation, treatment, and prevention of vitamin D    deficiency: an Endocrine Society clinical practice    guideline. JCEM. 2011 Jul; 96(7):1911-30.   T4, free     Status: Abnormal   Collection Time: 03/21/19  3:24 PM  Result Value Ref Range   Free T4 0.40 (L) 0.82 - 1.77 ng/dL  TSH     Status: Abnormal   Collection Time: 03/21/19  3:24 PM  Result Value Ref Range   TSH 13.900 (H) 0.450 - 4.500  uIU/mL  T3, free     Status: None   Collection Time: 03/21/19  3:24 PM  Result Value Ref Range   T3, Free 2.1 2.0 - 4.4 pg/mL  T4, free     Status: Abnormal    Collection Time: 05/28/19  2:21 PM  Result Value Ref Range   Free T4 1.99 (H) 0.82 - 1.77 ng/dL  TSH     Status: Abnormal   Collection Time: 05/28/19  2:21 PM  Result Value Ref Range   TSH <0.005 (L) 0.450 - 4.500 uIU/mL      Assessment & Plan:   1.  RAI induced hypothyroidism  2. hyperthyroidism due to Graves' disease- resolved 3.  Type 2 diabetes -She is status post I-131 thyroid ablative treatment on December 12, 2018.  Her previsit thyroid function tests are consistent with over replacement.  I discussed and lowered her levothyroxine to 137 mcg p.o. daily before breakfast.   - We discussed about the correct intake of her thyroid hormone, on empty stomach at fasting, with water, separated by at least 30 minutes from breakfast and other medications,  and separated by more than 4 hours from calcium, iron, multivitamins, acid reflux medications (PPIs). -Patient is made aware of the fact that thyroid hormone replacement is needed for life, dose to be adjusted by periodic monitoring of thyroid function tests.   -Regarding her type 2 diabetes, her A1c is 6.1%.  She is on Metformin 1000 mg p.o. twice daily.  She will have point-of-care A1c on subsequent visit.   - I advised her to maintain close follow up with Andres Shad, MD for primary care needs.      - Time spent on this patient care encounter:  20 minutes of which 50% was spent in  counseling and the rest reviewing  her current and  previous labs / studies and medications  doses and developing a plan for long term care. Kadelyn Dimascio Pauls  participated in the discussions, expressed understanding, and voiced agreement with the above plans.  All questions were answered to her satisfaction. she is encouraged to contact clinic should she have any questions or concerns prior to her return visit.   Follow up plan: Return in about 9 weeks (around 08/02/2019) for Follow up with Pre-visit Labs.   Thank you for involving me in the care  of this pleasant patient, and I will continue to update you with her progress.  Glade Lloyd, MD [facility] Phone: [phone]  Fax: [phone]   05/31/2019, 12:09 PM  This note was partially dictated with voice recognition software. Similar sounding words can be transcribed inadequately or may not  be corrected upon review.

## 2019-06-11 ENCOUNTER — Ambulatory Visit: Payer: BC Managed Care – PPO | Admitting: Gastroenterology

## 2019-06-11 ENCOUNTER — Telehealth: Payer: Self-pay | Admitting: Internal Medicine

## 2019-06-11 NOTE — Telephone Encounter (Signed)
Patient wants to know if she is supposed to have labs before her next appointment, said she can not find her papers from her last visit

## 2019-06-11 NOTE — Telephone Encounter (Signed)
Lmom, waiting on a return call. At last ov, a vitamin D level was suppose to be checked in 3 months. Lab orders were placed for [facility].

## 2019-06-13 NOTE — Telephone Encounter (Signed)
Spoke with pt. Pt is suppose to repeat vit D level in July 2021. Pt will f/u in our office and if anything changes, pt will be notified.

## 2019-06-26 ENCOUNTER — Telehealth (INDEPENDENT_AMBULATORY_CARE_PROVIDER_SITE_OTHER): Payer: BC Managed Care – PPO | Admitting: Gastroenterology

## 2019-06-26 ENCOUNTER — Other Ambulatory Visit: Payer: Self-pay

## 2019-06-26 DIAGNOSIS — K900 Celiac disease: Secondary | ICD-10-CM | POA: Diagnosis not present

## 2019-06-26 DIAGNOSIS — K219 Gastro-esophageal reflux disease without esophagitis: Secondary | ICD-10-CM

## 2019-06-26 DIAGNOSIS — K760 Fatty (change of) liver, not elsewhere classified: Secondary | ICD-10-CM

## 2019-06-26 NOTE — Progress Notes (Signed)
Primary Care Physician:  Andres Shad, MD  Primary GI: Dr. Gala Romney  Patient Location: Home   Provider Location: [facility] office   Reason for Visit: Follow-up   Persons present on the virtual encounter, with roles: Patient and NP   Due to COVID-19, visit was conducted using virtual method.  Visit was requested by patient.  Virtual Visit via MyChart Video Note Due to COVID-19, visit is conducted virtually and was requested by patient.   I connected with Melissa Schmidt on 06/26/19 at  3:30 PM EDT by telephone and verified that I am speaking with the correct person using two identifiers.   I discussed the limitations, risks, security and privacy concerns of performing an evaluation and management service by telephone and the availability of in person appointments. I also discussed with the patient that there may be a patient responsible charge related to this service. The patient expressed understanding and agreed to proceed.  Chief Complaint  Patient presents with  . Celiac Disease    f/u. reports her BM's are having a "smell" to it like it is burnt. Started after had IV radiation for thyroid in November.  . Abdominal Pain    f/u. None really unless she eats something that has celiac in it     History of Present Illness: 44 year old female with history of celiac disease, diagnosed in 2017, chronic GERD. Fatty liver.   Vit D in Feb 2021 insufficient, recommending 2000 iu Vit D daily. DEXA scan on file from Nov 2020.   Been fatigued and tired lately. Taking the 2000 iu now, and previously had not been taking as much. Last year had the iodine solution for thyroid. States stool has a weird smell to it. Wonders what it is. Sometimes smells like burnt rubber. No diarrhea. BM 3-4 times the other day but usually 1-2 times per day. Most of the times it is formed. This morning slight constipation. No rectal bleeding. Gluten-free diet. No abdominal pain. Leans on right side of abdomen to  wipe. Wonders about liver area. No pain now. Was just uncomfortable resting on it. Gallbladder present. States she asked Dr. Dorris Fetch about it as well. Takes iron pill as well.   Past Medical History:  Diagnosis Date  . Arthritis   . Borderline diabetes   . Celiac disease   . Complication of anesthesia    shaking post anesthesia  . Diabetes mellitus without complication ([place])   . Hemorrhoids   . Hypertriglyceridemia   . PCOS (polycystic ovarian syndrome)      Past Surgical History:  Procedure Laterality Date  . APPENDECTOMY  2002  . COLONOSCOPY WITH PROPOFOL N/A 05/25/2015   Dr.Rourk- non-bleeding hemorrhoids, the examination was o/w normal  . ESOPHAGEAL DILATION N/A 05/25/2015   Procedure: ESOPHAGEAL DILATION;  Surgeon: Daneil Dolin, MD;  [facility];  Service: Endoscopy;  Laterality: N/A;  . ESOPHAGOGASTRODUODENOSCOPY  04/2017   [place]: EGD with irregular Z-line s/p biopsy, benign-appearing esophageal stenosis s/p dilation, normal examined duodenum but no biopsies of duodenum. Pathology with junctional type GE mucosa with chronic inflammation and surface erosion, no metaplasia, negative dysplasia.   Marland Kitchen ESOPHAGOGASTRODUODENOSCOPY (EGD) WITH PROPOFOL N/A 05/25/2015   Dr.Rourk- normal esophagus, dilated, gastric erosions, intact fundoplication, normal third portion of the duodenum, non-bleeding erosive gastropathy. duodenum bx= benign small bowel mucosa with patchy increased intraepithelial lymphocytes, mild villous blunting and mild crypt hyperplasia, no dysplasia or malignancy stomach bx= erosive gastritis with reactive changes  . NISSEN FUNDOPLICATION  2005     Current Meds  Medication Sig  . albuterol (VENTOLIN HFA) 108 (90 Base) MCG/ACT inhaler Inhale 2 puffs into the lungs every 6 (six) hours as needed for wheezing or shortness of breath.  . Ascorbic Acid (VITAMIN C ADULT GUMMIES PO) Take by mouth daily.  Marland Kitchen azelastine (ASTELIN) 0.1 % nasal spray Place 1 spray into  both nostrils 2 (two) times daily. Use in each nostril as directed  . BREO ELLIPTA 200-25 MCG/INH AEPB Inhale 1 puff into the lungs daily.  . ferrous sulfate 325 (65 FE) MG EC tablet Take 325 mg by mouth daily with breakfast.  . fluticasone (FLONASE) 50 MCG/ACT nasal spray Place 2 sprays into both nostrils as needed.   Marland Kitchen glimepiride (AMARYL) 1 MG tablet Take 1 mg by mouth daily.  . hydrocortisone (ANUSOL-HC) 2.5 % rectal cream Place 1 application rectally 2 (two) times daily. (Patient taking differently: Place 1 application rectally as needed. )  . levothyroxine (SYNTHROID) 137 MCG tablet Take 1 tablet (137 mcg total) by mouth daily before breakfast.  . metFORMIN (GLUCOPHAGE) 1000 MG tablet Take 1,000 mg by mouth 2 (two) times daily with a meal.   . Multiple Vitamin (MULTIVITAMIN) capsule Take 1 capsule by mouth daily.  . Omega-3 Fatty Acids (OMEGA-3 FISH OIL PO) Take by mouth 3 (three) times daily.   . pantoprazole (PROTONIX) 40 MG tablet TAKE 1 TABLET BY MOUTH ONCE DAILY 30  MINUTES  BEFORE  BREAKFAST  . VITAMIN D PO Take 2,000 mg by mouth daily.     Family History  Problem Relation Age of Onset  . Irritable bowel syndrome Mother   . Inflammatory bowel disease Neg Hx   . Colon cancer Neg Hx   . Liver disease Neg Hx     Social History   Socioeconomic History  . Marital status: Married    Spouse name: Not on file  . Number of children: 1  . Years of education: Not on file  . Highest education level: Not on file  Occupational History  . Occupation: in school  Tobacco Use  . Smoking status: Never Smoker  . Smokeless tobacco: Never Used  Substance and Sexual Activity  . Alcohol use: No    Alcohol/week: 0.0 standard drinks  . Drug use: No  . Sexual activity: Yes    Birth control/protection: None  Other Topics Concern  . Not on file  Social History Narrative  . Not on file   Social Determinants of Health   Financial Resource Strain:   . Difficulty of Paying Living  Expenses:   Food Insecurity:   . Worried About Charity fundraiser in the Last Year:   . Arboriculturist in the Last Year:   Transportation Needs:   . Film/video editor (Medical):   Marland Kitchen Lack of Transportation (Non-Medical):   Physical Activity:   . Days of Exercise per Week:   . Minutes of Exercise per Session:   Stress:   . Feeling of Stress :   Social Connections:   . Frequency of Communication with Friends and Family:   . Frequency of Social Gatherings with Friends and Family:   . Attends Religious Services:   . Active Member of Clubs or Organizations:   . Attends Archivist Meetings:   Marland Kitchen Marital Status:        Review of Systems: Gen: Denies fever, chills, anorexia. Denies fatigue, weakness, weight loss.  CV: Denies chest pain, palpitations, syncope, peripheral edema,  and claudication. Resp: Denies dyspnea at rest, cough, wheezing, coughing up blood, and pleurisy. GI: see HPI Derm: Denies rash, itching, dry skin Psych: Denies depression, anxiety, memory loss, confusion. No homicidal or suicidal ideation.  Heme: Denies bruising, bleeding, and enlarged lymph nodes.  Observations/Objective: No distress. Pleasant and cooperative.   Assessment and Plan: 44 year old female with history of celiac disease, diagnosed in 2017, chronic GERD, fatty liver, Vit D insufficiency, here for follow-up via MyChart video.  Celiac disease: remains on gluten-free diet. DEXA scan on file from Nov 2020. Now on Vit D 2000 iu and taking routinely. Check CBC and iron studies along with Vit D in July 2021  Start probiotic daily. Unclear why foul smell to stool, as she is not having any diarrhea and no overt GI bleeding.   Return in 6 months or sooner if needed.   Follow Up Instructions:    I discussed the assessment and treatment plan with the patient. The patient was provided an opportunity to ask questions and all were answered. The patient agreed with the plan and demonstrated an  understanding of the instructions.   The patient was advised to call back or seek an in-person evaluation if the symptoms worsen or if the condition fails to improve as anticipated.    Annitta Needs, PhD, ANP-BC [facility]

## 2019-06-26 NOTE — Patient Instructions (Addendum)
I recommend starting a probiotic such as Digestive Advantage, Lee, or Restora. These are over-the-counter.  We will check iron levels at same time as Vit D around July 2021.  Continue multivitamin, iron, Vit D replacement.  Call if diarrhea that is persistent.  We will see you in 6 months!  I enjoyed seeing you again today! As you know, I value our relationship and want to provide genuine, compassionate, and quality care. I welcome your feedback. If you receive a survey regarding your visit,  I greatly appreciate you taking time to fill this out. See you next time!  Annitta Needs, PhD, ANP-BC [facility]

## 2019-06-27 ENCOUNTER — Encounter: Payer: Self-pay | Admitting: Internal Medicine

## 2019-07-03 ENCOUNTER — Telehealth: Payer: Self-pay | Admitting: *Deleted

## 2019-07-03 NOTE — Telephone Encounter (Signed)
[name], you are scheduled for a virtual visit with your provider today.  Just as we do with appointments in the office, we must obtain your consent to participate.  Your consent will be active for this visit and any virtual visit you may have with one of our providers in the next 365 days.  If you have a MyChart account, I can also send a copy of this consent to you electronically.  All virtual visits are billed to your insurance company just like a traditional visit in the office.  As this is a virtual visit, video technology does not allow for your provider to perform a traditional examination.  This may limit your provider's ability to fully assess your condition.  If your provider identifies any concerns that need to be evaluated in person or the need to arrange testing such as labs, EKG, etc, we will make arrangements to do so.  Although advances in technology are sophisticated, we cannot ensure that it will always work on either your end or our end.  If the connection with a video visit is poor, we may have to switch to a telephone visit.  With either a video or telephone visit, we are not always able to ensure that we have a secure connection.   I need to obtain your verbal consent now.   Are you willing to proceed with your visit today?

## 2019-07-03 NOTE — Telephone Encounter (Signed)
Pt consented to virtual/telephone visit on 06/26/19.

## 2019-07-09 ENCOUNTER — Other Ambulatory Visit: Payer: Self-pay | Admitting: *Deleted

## 2019-07-09 ENCOUNTER — Encounter: Payer: Self-pay | Admitting: *Deleted

## 2019-07-09 DIAGNOSIS — E559 Vitamin D deficiency, unspecified: Secondary | ICD-10-CM

## 2019-08-05 ENCOUNTER — Ambulatory Visit: Payer: BC Managed Care – PPO | Admitting: "Endocrinology

## 2019-09-02 ENCOUNTER — Other Ambulatory Visit: Payer: Self-pay | Admitting: "Endocrinology

## 2019-09-03 LAB — T4, FREE: Free T4: 2.75 ng/dL — ABNORMAL HIGH (ref 0.82–1.77)

## 2019-09-03 LAB — VITAMIN D 25 HYDROXY (VIT D DEFICIENCY, FRACTURES): Vit D, 25-Hydroxy: 34.2 ng/mL (ref 30.0–100.0)

## 2019-09-04 ENCOUNTER — Telehealth: Payer: Self-pay | Admitting: Internal Medicine

## 2019-09-04 ENCOUNTER — Other Ambulatory Visit: Payer: Self-pay | Admitting: Gastroenterology

## 2019-09-04 NOTE — Telephone Encounter (Signed)
Lmom, waiting on a return call.  

## 2019-09-04 NOTE — Telephone Encounter (Signed)
Pt said we needed to re fax her lab orders because the lab hasn't received them. [phone]

## 2019-09-05 ENCOUNTER — Encounter: Payer: Self-pay | Admitting: "Endocrinology

## 2019-09-05 ENCOUNTER — Ambulatory Visit (INDEPENDENT_AMBULATORY_CARE_PROVIDER_SITE_OTHER): Payer: BC Managed Care – PPO | Admitting: "Endocrinology

## 2019-09-05 ENCOUNTER — Other Ambulatory Visit: Payer: Self-pay

## 2019-09-05 VITALS — BP 120/78 | HR 84 | Ht 63.0 in | Wt 289.8 lb

## 2019-09-05 DIAGNOSIS — E890 Postprocedural hypothyroidism: Secondary | ICD-10-CM

## 2019-09-05 LAB — CBC WITH DIFFERENTIAL/PLATELET
Basophils Absolute: 0 10*3/uL (ref 0.0–0.2)
Basos: 0 %
EOS (ABSOLUTE): 0.2 10*3/uL (ref 0.0–0.4)
Eos: 2 %
Hematocrit: 36.8 % (ref 34.0–46.6)
Hemoglobin: 11.4 g/dL (ref 11.1–15.9)
Immature Grans (Abs): 0 10*3/uL (ref 0.0–0.1)
Immature Granulocytes: 0 %
Lymphocytes Absolute: 3 10*3/uL (ref 0.7–3.1)
Lymphs: 23 %
MCH: 22.2 pg — ABNORMAL LOW (ref 26.6–33.0)
MCHC: 31 g/dL — ABNORMAL LOW (ref 31.5–35.7)
MCV: 72 fL — ABNORMAL LOW (ref 79–97)
Monocytes Absolute: 0.9 10*3/uL (ref 0.1–0.9)
Monocytes: 7 %
Neutrophils Absolute: 8.7 10*3/uL — ABNORMAL HIGH (ref 1.4–7.0)
Neutrophils: 68 %
Platelets: 377 10*3/uL (ref 150–450)
RBC: 5.13 x10E6/uL (ref 3.77–5.28)
RDW: 15.7 % — ABNORMAL HIGH (ref 11.7–15.4)
WBC: 12.9 10*3/uL — ABNORMAL HIGH (ref 3.4–10.8)

## 2019-09-05 LAB — FERRITIN: Ferritin: 60 ng/mL (ref 15–150)

## 2019-09-05 LAB — IRON AND TIBC
Iron Saturation: 11 % — ABNORMAL LOW (ref 15–55)
Iron: 30 ug/dL (ref 27–159)
Total Iron Binding Capacity: 269 ug/dL (ref 250–450)
UIBC: 239 ug/dL (ref 131–425)

## 2019-09-05 MED ORDER — LEVOTHYROXINE SODIUM 112 MCG PO TABS: 112.0000 ug | ORAL_TABLET | Freq: Every day | ORAL | 2 refills | Status: DC

## 2019-09-05 NOTE — Progress Notes (Signed)
09/05/2019          Endocrinology follow-up note   Subjective:    Patient ID: Melissa Schmidt, female    DOB: May 29, 1975, PCP Andres Shad, MD.   Past Medical History:  Diagnosis Date  . Arthritis   . Borderline diabetes   . Celiac disease   . Complication of anesthesia    shaking post anesthesia  . Diabetes mellitus without complication ([place])   . Hemorrhoids   . Hypertriglyceridemia   . PCOS (polycystic ovarian syndrome)    Past Surgical History:  Procedure Laterality Date  . APPENDECTOMY  2002  . COLONOSCOPY WITH PROPOFOL N/A 05/25/2015   Dr.Rourk- non-bleeding hemorrhoids, the examination was o/w normal  . ESOPHAGEAL DILATION N/A 05/25/2015   Procedure: ESOPHAGEAL DILATION;  Surgeon: Daneil Dolin, MD;  Location: [facility];  Service: Endoscopy;  Laterality: N/A;  . ESOPHAGOGASTRODUODENOSCOPY  04/2017   [place]: EGD with irregular Z-line s/p biopsy, benign-appearing esophageal stenosis s/p dilation, normal examined duodenum but no biopsies of duodenum. Pathology with junctional type GE mucosa with chronic inflammation and surface erosion, no metaplasia, negative dysplasia.   Marland Kitchen ESOPHAGOGASTRODUODENOSCOPY (EGD) WITH PROPOFOL N/A 05/25/2015   Dr.Rourk- normal esophagus, dilated, gastric erosions, intact fundoplication, normal third portion of the duodenum, non-bleeding erosive gastropathy. duodenum bx= benign small bowel mucosa with patchy increased intraepithelial lymphocytes, mild villous blunting and mild crypt hyperplasia, no dysplasia or malignancy stomach bx= erosive gastritis with reactive changes  . NISSEN FUNDOPLICATION  6144   Social History   Socioeconomic History  . Marital status: Married    Spouse name: Not on file  . Number of children: 1  . Years of education: Not on file  . Highest education level: Not on file  Occupational History  . Occupation: in school  Tobacco Use  . Smoking status: Never Smoker  . Smokeless tobacco: Never Used  Vaping  Use  . Vaping Use: Never used  Substance and Sexual Activity  . Alcohol use: No    Alcohol/week: 0.0 standard drinks  . Drug use: No  . Sexual activity: Yes    Birth control/protection: None  Other Topics Concern  . Not on file  Social History Narrative  . Not on file   Social Determinants of Health   Financial Resource Strain:   . Difficulty of Paying Living Expenses:   Food Insecurity:   . Worried About Charity fundraiser in the Last Year:   . Arboriculturist in the Last Year:   Transportation Needs:   . Film/video editor (Medical):   Marland Kitchen Lack of Transportation (Non-Medical):   Physical Activity:   . Days of Exercise per Week:   . Minutes of Exercise per Session:   Stress:   . Feeling of Stress :   Social Connections:   . Frequency of Communication with Friends and Family:   . Frequency of Social Gatherings with Friends and Family:   . Attends Religious Services:   . Active Member of Clubs or Organizations:   . Attends Archivist Meetings:   Marland Kitchen Marital Status:    Outpatient Encounter Medications as of 09/05/2019  Medication Sig  . Ascorbic Acid (VITAMIN C ADULT GUMMIES PO) Take by mouth daily.  Marland Kitchen azelastine (ASTELIN) 0.1 % nasal spray Place 1 spray into both nostrils 2 (two) times daily. Use in each nostril as directed  . BREO ELLIPTA 200-25 MCG/INH AEPB Inhale 1 puff into the lungs daily.  . ferrous sulfate 325 (65  FE) MG EC tablet Take 325 mg by mouth daily with breakfast.  . fluticasone (FLONASE) 50 MCG/ACT nasal spray Place 2 sprays into both nostrils as needed.   Marland Kitchen glimepiride (AMARYL) 1 MG tablet Take 1 mg by mouth daily.  . hydrocortisone (ANUSOL-HC) 2.5 % rectal cream Place 1 application rectally 2 (two) times daily. (Patient taking differently: Place 1 application rectally as needed. )  . levothyroxine (SYNTHROID) 112 MCG tablet Take 1 tablet (112 mcg total) by mouth daily before breakfast.  . lisinopril (ZESTRIL) 10 MG tablet Take 10 mg by mouth  daily.  . metFORMIN (GLUCOPHAGE) 1000 MG tablet Take 1,000 mg by mouth 2 (two) times daily with a meal.   . Multiple Vitamin (MULTIVITAMIN) capsule Take 1 capsule by mouth daily.  . Omega-3 Fatty Acids (OMEGA-3 FISH OIL PO) Take by mouth 3 (three) times daily.   . pantoprazole (PROTONIX) 40 MG tablet TAKE 1 TABLET BY MOUTH ONCE DAILY 30  MINUTES  BEFORE  BREAKFAST  . VITAMIN D PO Take 2,000 mg by mouth daily.  . [DISCONTINUED] albuterol (VENTOLIN HFA) 108 (90 Base) MCG/ACT inhaler Inhale 2 puffs into the lungs every 6 (six) hours as needed for wheezing or shortness of breath.  . [DISCONTINUED] levothyroxine (SYNTHROID) 137 MCG tablet Take 1 tablet (137 mcg total) by mouth daily before breakfast.   No facility-administered encounter medications on file as of 09/05/2019.    ALLERGIES: Allergies  Allergen Reactions  . Singulair [Montelukast]     VACCINATION STATUS:  There is no immunization history on file for this patient.   HPI  Melissa Schmidt is 44 y.o. female who is being seen in follow-up for her history of RAI induced hypothyroidism.   She was given I-131 thyroid ablation for clinically significant primary hyperthyroidism from Graves' disease.   -This treatment was administered on December 12, 2018 after her thyroid uptake showed 68% in 24 hours 24 minutes suggesting Graves' disease.  She was kept on levothyroxine 137 mcg p.o. daily during her last visit.  She continues to feel better, however her previsit labs are consistent with slight over replacement.   -She has steady weight.  She denies palpitations, tremors, nor heat intolerance. She  lost 68 pounds over 6 months prior to initiation of treatment.  She was recently treated for Covid pneumonia. she denies family history of thyroid dysfunction nor any thyroid cancer.                            Review of systems  Limited as above  Objective:    BP 120/78   Pulse 84   Ht 5' 3"  (1.6 m)   Wt (!) 289 lb 12.8 oz (131.5 kg)    BMI 51.34 kg/m   Wt Readings from Last 3 Encounters:  09/05/19 (!) 289 lb 12.8 oz (131.5 kg)  05/31/19 292 lb 3.2 oz (132.5 kg)  01/30/19 276 lb 3.2 oz (125.3 kg)        Physical Exam- Limited  Constitutional:  Body mass index is 51.34 kg/m. , not in acute distress, normal state of mind Eyes:  EOMI, no exophthalmos Neck: Supple Thyroid: No gross goiter Respiratory: Adequate breathing efforts Musculoskeletal: no gross deformities, strength intact in all four extremities, no gross restriction of joint movements Skin:  no rashes, no hyperemia Neurological: no tremor with outstretched hands                 Component Value Date/Time  NA 140 11/05/2018 1551   K 4.6 11/05/2018 1551   CL 103 11/05/2018 1551   CO2 25 11/05/2018 1551   GLUCOSE 55 (L) 11/05/2018 1551   GLUCOSE 163 (H) 05/21/2015 1010   BUN 12 11/05/2018 1551   CREATININE 0.71 11/05/2018 1551   CALCIUM 9.9 11/05/2018 1551   PROT 6.8 11/05/2018 1551   ALBUMIN 4.2 11/05/2018 1551   AST 16 11/05/2018 1551   ALT 22 11/05/2018 1551   ALKPHOS 89 11/05/2018 1551   BILITOT <0.2 11/05/2018 1551   GFRNONAA 106 11/05/2018 1551   GFRAA 122 11/05/2018 1551     CBC    Component Value Date/Time   WBC 12.9 (H) 09/04/2019 1453   WBC 9.6 05/21/2015 1010   RBC 5.13 09/04/2019 1453   RBC 4.41 05/21/2015 1010   HGB 11.4 09/04/2019 1453   HCT 36.8 09/04/2019 1453   PLT 377 09/04/2019 1453   MCV 72 (L) 09/04/2019 1453   MCH 22.2 (L) 09/04/2019 1453   MCH 25.4 (L) 05/21/2015 1010   MCHC 31.0 (L) 09/04/2019 1453   MCHC 31.5 05/21/2015 1010   RDW 15.7 (H) 09/04/2019 1453   LYMPHSABS 3.0 09/04/2019 1453   EOSABS 0.2 09/04/2019 1453   BASOSABS 0.0 09/04/2019 1453     Diabetic Labs (most recent): Lab Results  Component Value Date   HGBA1C 5.9 07/31/2017    Lipid Panel     Component Value Date/Time   CHOL 150 12/13/2016 0000   TRIG 172 (A) 12/13/2016 0000   HDL 41 12/13/2016 0000   LDLCALC 75 12/13/2016 0000      Thyroid uptake and scan on October 20, 20 FINDINGS: Homogeneous tracer accumulation within both thyroid lobes, diffusely increased.  No focal areas of increased or decreased tracer localization. 4 hour I-123 uptake = 57% (normal 5-20%) 24 hour I-123 uptake = 68% (normal 10-30%)  IMPRESSION: Diffusely increased homogeneous tracer accumulation in both thyroid lobes.  Increased 4 hour and 24 hour radio iodine uptakes consistent with hyperthyroidism. Findings consistent with Graves disease.  I-131 thyroid ablation on December 12, 2018   Recent Results (from the past 2160 hour(s))  Vitamin D (25 hydroxy)     Status: None   Collection Time: 09/02/19  4:31 PM  Result Value Ref Range   Vit D, 25-Hydroxy 34.2 30.0 - 100.0 ng/mL    Comment: Vitamin D deficiency has been defined by the [place] practice guideline as a level of serum 25-OH vitamin D less than 20 ng/mL (1,2). The Endocrine Society went on to further define vitamin D insufficiency as a level between 21 and 29 ng/mL (2). 1. IOM (Institute of Medicine). 2010. Dietary reference    intakes for calcium and D. [place]: The    [employer]. 2. Holick MF, Binkley [place], Bischoff-Ferrari HA, et al.    Evaluation, treatment, and prevention of vitamin D    deficiency: an Endocrine Society clinical practice    guideline. JCEM. 2011 Jul; 96(7):1911-30.   T4, free     Status: Abnormal   Collection Time: 09/02/19  4:32 PM  Result Value Ref Range   Free T4 2.75 (H) 0.82 - 1.77 ng/dL  CBC with Differential/Platelet     Status: Abnormal   Collection Time: 09/04/19  2:53 PM  Result Value Ref Range   WBC 12.9 (H) 3.4 - 10.8 x10E3/uL   RBC 5.13 3.77 - 5.28 x10E6/uL   Hemoglobin 11.4 11.1 - 15.9 g/dL   Hematocrit 36.8  34.0 - 46.6 %   MCV 72 (L) 79 - 97 fL   MCH 22.2 (L) 26.6 - 33.0 pg   MCHC 31.0 (L) 31 - 35 g/dL   RDW 15.7 (H) 11.7 - 15.4 %   Platelets 377 150 - 450  x10E3/uL   Neutrophils 68 Not Estab. %   Lymphs 23 Not Estab. %   Monocytes 7 Not Estab. %   Eos 2 Not Estab. %   Basos 0 Not Estab. %   Neutrophils Absolute 8.7 (H) 1 - 7 x10E3/uL   Lymphocytes Absolute 3.0 0 - 3 x10E3/uL   Monocytes Absolute 0.9 0 - 0 x10E3/uL   EOS (ABSOLUTE) 0.2 0.0 - 0.4 x10E3/uL   Basophils Absolute 0.0 0 - 0 x10E3/uL   Immature Granulocytes 0 Not Estab. %   Immature Grans (Abs) 0.0 0.0 - 0.1 x10E3/uL  Iron and TIBC     Status: Abnormal   Collection Time: 09/04/19  2:53 PM  Result Value Ref Range   Total Iron Binding Capacity 269 250 - 450 ug/dL   UIBC 239 131 - 425 ug/dL   Iron 30 27 - 159 ug/dL   Iron Saturation 11 (L) 15 - 55 %  Ferritin     Status: None   Collection Time: 09/04/19  2:53 PM  Result Value Ref Range   Ferritin 60 15.0 - 150.0 ng/mL      Assessment & Plan:   1.  RAI induced hypothyroidism  2. hyperthyroidism due to Graves' disease- resolved 3.  Type 2 diabetes -She is status post I-131 thyroid ablative treatment on December 12, 2018.  Her previsit thyroid function tests are consistent with over replacement.  I discussed and lowered her levothyroxine to 112 mcg p.o. daily before breakfast.   - We discussed about the correct intake of her thyroid hormone, on empty stomach at fasting, with water, separated by at least 30 minutes from breakfast and other medications,  and separated by more than 4 hours from calcium, iron, multivitamins, acid reflux medications (PPIs). -Patient is made aware of the fact that thyroid hormone replacement is needed for life, dose to be adjusted by periodic monitoring of thyroid function tests.   -Regarding her type 2 diabetes, her A1c is 6.1%.  She is on Metformin 1000 mg p.o. twice daily.  She will have point-of-care A1c on subsequent visit.   - I advised her to maintain close follow up with Andres Shad, MD for primary care needs.       - Time spent on this patient care encounter:  20 minutes of  which 50% was spent in  counseling and the rest reviewing  her current and  previous labs / studies and medications  doses and developing a plan for long term care. Evalee Gerard Odonell  participated in the discussions, expressed understanding, and voiced agreement with the above plans.  All questions were answered to her satisfaction. she is encouraged to contact clinic should she have any questions or concerns prior to her return visit.    Follow up plan: Return in about 3 months (around 12/06/2019) for F/U with Pre-visit Labs.   Thank you for involving me in the care of this pleasant patient, and I will continue to update you with her progress.  Glade Lloyd, MD [facility] Phone: [phone]  Fax: [phone]   09/05/2019, 1:55 PM  This note was partially dictated with voice recognition software. Similar sounding words can be transcribed inadequately or  may not  be corrected upon review.

## 2019-09-05 NOTE — Telephone Encounter (Signed)
Pt called back and found the labs needed.

## 2019-09-13 ENCOUNTER — Other Ambulatory Visit: Payer: Self-pay

## 2019-09-13 DIAGNOSIS — D72829 Elevated white blood cell count, unspecified: Secondary | ICD-10-CM

## 2019-10-16 ENCOUNTER — Telehealth: Payer: Self-pay

## 2019-10-16 DIAGNOSIS — R1319 Other dysphagia: Secondary | ICD-10-CM

## 2019-10-16 DIAGNOSIS — K219 Gastro-esophageal reflux disease without esophagitis: Secondary | ICD-10-CM

## 2019-10-16 NOTE — Telephone Encounter (Signed)
Refill request for Pantoprazole 40 mg 30 mins before breakfast, #90 capsules.

## 2019-10-21 MED ORDER — PANTOPRAZOLE SODIUM 40 MG PO TBEC: 40.0000 mg | DELAYED_RELEASE_TABLET | Freq: Every day | ORAL | 3 refills | Status: DC

## 2019-10-21 NOTE — Addendum Note (Signed)
Addended by: Gordy Levan, Jeyda Siebel A on: 10/21/2019 07:51 PM   Modules accepted: Orders

## 2019-11-11 ENCOUNTER — Encounter: Payer: Self-pay | Admitting: *Deleted

## 2019-11-11 ENCOUNTER — Other Ambulatory Visit: Payer: Self-pay | Admitting: *Deleted

## 2019-11-11 DIAGNOSIS — D72829 Elevated white blood cell count, unspecified: Secondary | ICD-10-CM

## 2019-11-22 LAB — LIPID PANEL
Cholesterol: 146 (ref 0–200)
HDL: 41 (ref 35–70)
LDL Cholesterol: 78
Triglycerides: 156 (ref 40–160)

## 2019-11-22 LAB — TSH: TSH: 0.01 — AB (ref 0.41–5.90)

## 2019-11-22 LAB — COMPREHENSIVE METABOLIC PANEL
Calcium: 9.4 (ref 8.7–10.7)
GFR calc Af Amer: 131
GFR calc non Af Amer: 113

## 2019-11-22 LAB — HEMOGLOBIN A1C: Hemoglobin A1C: 6.2

## 2019-11-22 LAB — BASIC METABOLIC PANEL
BUN: 14 (ref 4–21)
Creatinine: 0.6 (ref 0.5–1.1)

## 2019-11-25 ENCOUNTER — Other Ambulatory Visit: Payer: Self-pay | Admitting: "Endocrinology

## 2019-12-07 LAB — CBC WITH DIFFERENTIAL/PLATELET
Basophils Absolute: 0 10*3/uL (ref 0.0–0.2)
Basos: 0 %
EOS (ABSOLUTE): 0.2 10*3/uL (ref 0.0–0.4)
Eos: 2 %
Hematocrit: 36.6 % (ref 34.0–46.6)
Hemoglobin: 11.5 g/dL (ref 11.1–15.9)
Immature Grans (Abs): 0 10*3/uL (ref 0.0–0.1)
Immature Granulocytes: 0 %
Lymphocytes Absolute: 3.6 10*3/uL — ABNORMAL HIGH (ref 0.7–3.1)
Lymphs: 30 %
MCH: 22.4 pg — ABNORMAL LOW (ref 26.6–33.0)
MCHC: 31.4 g/dL — ABNORMAL LOW (ref 31.5–35.7)
MCV: 71 fL — ABNORMAL LOW (ref 79–97)
Monocytes Absolute: 0.8 10*3/uL (ref 0.1–0.9)
Monocytes: 7 %
Neutrophils Absolute: 7.3 10*3/uL — ABNORMAL HIGH (ref 1.4–7.0)
Neutrophils: 61 %
Platelets: 452 10*3/uL — ABNORMAL HIGH (ref 150–450)
RBC: 5.13 x10E6/uL (ref 3.77–5.28)
RDW: 15.4 % (ref 11.7–15.4)
WBC: 11.9 10*3/uL — ABNORMAL HIGH (ref 3.4–10.8)

## 2019-12-07 LAB — T4, FREE: Free T4: 2.89 ng/dL — ABNORMAL HIGH (ref 0.82–1.77)

## 2019-12-07 LAB — IRON,TIBC AND FERRITIN PANEL
Ferritin: 48 ng/mL (ref 15–150)
Iron Saturation: 9 % — CL (ref 15–55)
Iron: 24 ug/dL — ABNORMAL LOW (ref 27–159)
Total Iron Binding Capacity: 270 ug/dL (ref 250–450)
UIBC: 246 ug/dL (ref 131–425)

## 2019-12-07 LAB — TSH: TSH: <0.005 u[IU]/mL — ABNORMAL LOW (ref 0.450–4.500)

## 2019-12-09 ENCOUNTER — Telehealth (INDEPENDENT_AMBULATORY_CARE_PROVIDER_SITE_OTHER): Payer: BC Managed Care – PPO | Admitting: "Endocrinology

## 2019-12-09 ENCOUNTER — Encounter: Payer: Self-pay | Admitting: "Endocrinology

## 2019-12-09 DIAGNOSIS — E0590 Thyrotoxicosis, unspecified without thyrotoxic crisis or storm: Secondary | ICD-10-CM | POA: Diagnosis not present

## 2019-12-09 NOTE — Progress Notes (Signed)
12/09/2019                                         Endocrinology Telehealth Visit Follow up Note -During COVID -19 Pandemic  This visit type was conducted  via telephone due to national recommendations for restrictions regarding the COVID-19 Pandemic  in an effort to limit this patient's exposure and mitigate transmission of the corona virus.   I connected with Melissa Schmidt on 12/09/2019   by telephone and verified that I am speaking with the correct person using two identifiers. Melissa Schmidt, [birth year]. she has verbally consented to this visit.  I was in my office and patient was in her residence. No other persons were with me during the encounter. All issues noted in this document were discussed and addressed. The format was not optimal for physical exam.   Subjective:    Patient ID: Melissa Schmidt, female    DOB: 1975-05-06, PCP Melissa Shad, MD.   Past Medical History:  Diagnosis Date  . Arthritis   . Borderline diabetes   . Celiac disease   . Complication of anesthesia    shaking post anesthesia  . Diabetes mellitus without complication ([place])   . Hemorrhoids   . Hypertriglyceridemia   . PCOS (polycystic ovarian syndrome)    Past Surgical History:  Procedure Laterality Date  . APPENDECTOMY  2002  . COLONOSCOPY WITH PROPOFOL N/A 05/25/2015   Dr.Rourk- non-bleeding hemorrhoids, the examination was o/w normal  . ESOPHAGEAL DILATION N/A 05/25/2015   Procedure: ESOPHAGEAL DILATION;  Surgeon: Daneil Dolin, MD;  Location: [facility];  Service: Endoscopy;  Laterality: N/A;  . ESOPHAGOGASTRODUODENOSCOPY  04/2017   [place]: EGD with irregular Z-line s/p biopsy, benign-appearing esophageal stenosis s/p dilation, normal examined duodenum but no biopsies of duodenum. Pathology with junctional type GE mucosa with chronic inflammation and surface erosion, no metaplasia, negative dysplasia.   Marland Kitchen ESOPHAGOGASTRODUODENOSCOPY (EGD) WITH PROPOFOL N/A 05/25/2015   Dr.Rourk- normal  esophagus, dilated, gastric erosions, intact fundoplication, normal third portion of the duodenum, non-bleeding erosive gastropathy. duodenum bx= benign small bowel mucosa with patchy increased intraepithelial lymphocytes, mild villous blunting and mild crypt hyperplasia, no dysplasia or malignancy stomach bx= erosive gastritis with reactive changes  . NISSEN FUNDOPLICATION  4854   Social History   Socioeconomic History  . Marital status: Married    Spouse name: Not on file  . Number of children: 1  . Years of education: Not on file  . Highest education level: Not on file  Occupational History  . Occupation: in school  Tobacco Use  . Smoking status: Never Smoker  . Smokeless tobacco: Never Used  Vaping Use  . Vaping Use: Never used  Substance and Sexual Activity  . Alcohol use: No    Alcohol/week: 0.0 standard drinks  . Drug use: No  . Sexual activity: Yes    Birth control/protection: None  Other Topics Concern  . Not on file  Social History Narrative  . Not on file   Social Determinants of Health   Financial Resource Strain:   . Difficulty of Paying Living Expenses: Not on file  Food Insecurity:   . Worried About Charity fundraiser in the Last Year: Not on file  . Ran Out of Food in the Last Year: Not on file  Transportation Needs:   . Lack of Transportation (Medical): Not on file  .  Lack of Transportation (Non-Medical): Not on file  Physical Activity:   . Days of Exercise per Week: Not on file  . Minutes of Exercise per Session: Not on file  Stress:   . Feeling of Stress : Not on file  Social Connections:   . Frequency of Communication with Friends and Family: Not on file  . Frequency of Social Gatherings with Friends and Family: Not on file  . Attends Religious Services: Not on file  . Active Member of Clubs or Organizations: Not on file  . Attends Archivist Meetings: Not on file  . Marital Status: Not on file   Outpatient Encounter Medications as of  12/09/2019  Medication Sig  . Ascorbic Acid (VITAMIN C ADULT GUMMIES PO) Take by mouth daily.  Marland Kitchen azelastine (ASTELIN) 0.1 % nasal spray Place 1 spray into both nostrils 2 (two) times daily. Use in each nostril as directed  . BREO ELLIPTA 200-25 MCG/INH AEPB Inhale 1 puff into the lungs daily.  . EUTHYROX 112 MCG tablet TAKE 1 TABLET BY MOUTH BEFORE BREAKFAST  . ferrous sulfate 325 (65 FE) MG EC tablet Take 325 mg by mouth daily with breakfast.  . fluticasone (FLONASE) 50 MCG/ACT nasal spray Place 2 sprays into both nostrils as needed.   Marland Kitchen glimepiride (AMARYL) 1 MG tablet Take 1 mg by mouth daily.  . hydrocortisone (ANUSOL-HC) 2.5 % rectal cream Place 1 application rectally 2 (two) times daily. (Patient taking differently: Place 1 application rectally as needed. )  . lisinopril (ZESTRIL) 10 MG tablet Take 10 mg by mouth daily.  . metFORMIN (GLUCOPHAGE) 1000 MG tablet Take 1,000 mg by mouth 2 (two) times daily with a meal.   . Multiple Vitamin (MULTIVITAMIN) capsule Take 1 capsule by mouth daily.  . Omega-3 Fatty Acids (OMEGA-3 FISH OIL PO) Take by mouth 3 (three) times daily.   . pantoprazole (PROTONIX) 40 MG tablet Take 1 tablet (40 mg total) by mouth daily before breakfast.  . VITAMIN D PO Take 2,000 mg by mouth daily.   No facility-administered encounter medications on file as of 12/09/2019.    ALLERGIES: Allergies  Allergen Reactions  . Singulair [Montelukast]     VACCINATION STATUS:  There is no immunization history on file for this patient.   HPI  Melissa Schmidt is 44 y.o. female who is being seen in follow-up for her history of RAI induced hypothyroidism.   She was given I-131 thyroid ablation for clinically significant primary hyperthyroidism from Graves' disease.   -This treatment was administered on December 12, 2018 after her thyroid uptake showed 68% in 24 hours  suggesting Graves' disease. -For RAI induced hypothyroidism, she was subsequently initiated on levothyroxine,  during last visit lowered to 112 mcg p.o. daily before breakfast.  This adjustment was necessary due to iatrogenic thyrotoxicosis. -She continued to have symptoms including palpitations, tremors, heat intolerance.  Her previsit labs are consistent with persistent thyrotoxicosis. -She has steady weight.   She was recently treated for Covid pneumonia. she denies family history of thyroid dysfunction nor any thyroid cancer.                            Review of systems  Limited as above  Objective:    There were no vitals taken for this visit.  Wt Readings from Last 3 Encounters:  09/05/19 (!) 289 lb 12.8 oz (131.5 kg)  05/31/19 292 lb 3.2 oz (132.5 kg)  01/30/19 276  lb 3.2 oz (125.3 kg)                   Component Value Date/Time   NA 140 11/05/2018 1551   K 4.6 11/05/2018 1551   CL 103 11/05/2018 1551   CO2 25 11/05/2018 1551   GLUCOSE 55 (L) 11/05/2018 1551   GLUCOSE 163 (H) 05/21/2015 1010   BUN 14 11/22/2019 0000   CREATININE 0.6 11/22/2019 0000   CREATININE 0.71 11/05/2018 1551   CALCIUM 9.4 11/22/2019 0000   PROT 6.8 11/05/2018 1551   ALBUMIN 4.2 11/05/2018 1551   AST 16 11/05/2018 1551   ALT 22 11/05/2018 1551   ALKPHOS 89 11/05/2018 1551   BILITOT <0.2 11/05/2018 1551   GFRNONAA 113 11/22/2019 0000   GFRAA 131 11/22/2019 0000     CBC    Component Value Date/Time   WBC 11.9 (H) 12/06/2019 1426   WBC 9.6 05/21/2015 1010   RBC 5.13 12/06/2019 1426   RBC 4.41 05/21/2015 1010   HGB 11.5 12/06/2019 1426   HCT 36.6 12/06/2019 1426   PLT 452 (H) 12/06/2019 1426   MCV 71 (L) 12/06/2019 1426   MCH 22.4 (L) 12/06/2019 1426   MCH 25.4 (L) 05/21/2015 1010   MCHC 31.4 (L) 12/06/2019 1426   MCHC 31.5 05/21/2015 1010   RDW 15.4 12/06/2019 1426   LYMPHSABS 3.6 (H) 12/06/2019 1426   EOSABS 0.2 12/06/2019 1426   BASOSABS 0.0 12/06/2019 1426     Diabetic Labs (most recent): Lab Results  Component Value Date   HGBA1C 6.2 11/22/2019   HGBA1C 5.9 07/31/2017     Lipid Panel     Component Value Date/Time   CHOL 146 11/22/2019 0000   TRIG 156 11/22/2019 0000   HDL 41 11/22/2019 0000   LDLCALC 78 11/22/2019 0000     Thyroid uptake and scan on October 20, 20 FINDINGS: Homogeneous tracer accumulation within both thyroid lobes, diffusely increased.  No focal areas of increased or decreased tracer localization. 4 hour I-123 uptake = 57% (normal 5-20%) 24 hour I-123 uptake = 68% (normal 10-30%)  IMPRESSION: Diffusely increased homogeneous tracer accumulation in both thyroid lobes.  Increased 4 hour and 24 hour radio iodine uptakes consistent with hyperthyroidism. Findings consistent with Graves disease.  I-131 thyroid ablation on December 12, 2018   Recent Results (from the past 2160 hour(s))  Basic metabolic panel     Status: None   Collection Time: 11/22/19 12:00 AM  Result Value Ref Range   BUN 14 4 - 21   Creatinine 0.6 0.5 - 1.1  Comprehensive metabolic panel     Status: None   Collection Time: 11/22/19 12:00 AM  Result Value Ref Range   GFR calc Af Amer 131    GFR calc non Af Amer 113    Calcium 9.4 8.7 - 10.7  Lipid panel     Status: None   Collection Time: 11/22/19 12:00 AM  Result Value Ref Range   Triglycerides 156 40 - 160   Cholesterol 146 0 - 200   HDL 41 35 - 70   LDL Cholesterol 78   Hemoglobin A1c     Status: None   Collection Time: 11/22/19 12:00 AM  Result Value Ref Range   Hemoglobin A1C 6.2   TSH     Status: Abnormal   Collection Time: 11/22/19 12:00 AM  Result Value Ref Range   TSH 0.01 (A) 0.41 - 5.90    Comment: T4- 15.7, T3- 31  T4,  free     Status: Abnormal   Collection Time: 12/06/19  7:37 AM  Result Value Ref Range   Free T4 2.89 (H) 0.82 - 1.77 ng/dL  TSH     Status: Abnormal   Collection Time: 12/06/19  7:37 AM  Result Value Ref Range   TSH <0.005 (L) 0.450 - 4.500 uIU/mL  CBC with Differential     Status: Abnormal   Collection Time: 12/06/19  2:26 PM  Result Value Ref Range    WBC 11.9 (H) 3.4 - 10.8 x10E3/uL   RBC 5.13 3.77 - 5.28 x10E6/uL   Hemoglobin 11.5 11.1 - 15.9 g/dL   Hematocrit 36.6 34.0 - 46.6 %   MCV 71 (L) 79 - 97 fL   MCH 22.4 (L) 26.6 - 33.0 pg   MCHC 31.4 (L) 31 - 35 g/dL   RDW 15.4 11.7 - 15.4 %   Platelets 452 (H) 150 - 450 x10E3/uL   Neutrophils 61 Not Estab. %   Lymphs 30 Not Estab. %   Monocytes 7 Not Estab. %   Eos 2 Not Estab. %   Basos 0 Not Estab. %   Neutrophils Absolute 7.3 (H) 1.40 - 7.00 x10E3/uL   Lymphocytes Absolute 3.6 (H) 0 - 3 x10E3/uL   Monocytes Absolute 0.8 0 - 0 x10E3/uL   EOS (ABSOLUTE) 0.2 0.0 - 0.4 x10E3/uL   Basophils Absolute 0.0 0 - 0 x10E3/uL   Immature Granulocytes 0 Not Estab. %   Immature Grans (Abs) 0.0 0.0 - 0.1 x10E3/uL  Iron, TIBC and Ferritin Panel     Status: Abnormal   Collection Time: 12/06/19  2:26 PM  Result Value Ref Range   Total Iron Binding Capacity 270 250 - 450 ug/dL   UIBC 246 131 - 425 ug/dL   Iron 24 (L) 27 - 159 ug/dL   Iron Saturation 9 (LL) 15 - 55 %   Ferritin 48 15.0 - 150.0 ng/mL      Assessment & Plan:   1.  RAI induced hypothyroidism  2. hyperthyroidism due to Graves' disease- resolved 3.  Type 2 diabetes -She is status post I-131 thyroid ablative treatment on December 12, 2018.  -Her thyroid function tests are still consistent with iatrogenic thyrotoxicosis/treatment failure with relapse of hyperthyroidism.  She is advised to hold her levothyroxine for a week, repeat labs and will have a phone visit.  If he continues to have thyrotoxicosis, she will have repeat thyroid uptake and scan in preparation for retreatment with RAI.  -Regarding her type 2 diabetes, her A1c is 6.1%.  She is on Metformin 1000 mg p.o. twice daily.  She will have point-of-care A1c on subsequent visit.   - I advised her to maintain close follow up with Melissa Shad, MD for primary care needs.     - Time spent on this patient care encounter:  20 minutes of which 50% was spent in   counseling and the rest reviewing  her current and  previous labs / studies and medications  doses and developing a plan for long term care. Melissa Schmidt  participated in the discussions, expressed understanding, and voiced agreement with the above plans.  All questions were answered to her satisfaction. she is encouraged to contact clinic should she have any questions or concerns prior to her return visit.   Follow up plan: Return in about 10 days (around 12/19/2019), or patient is advised to get labs on Nov 10, for F/U with Pre-visit Labs.   Thank you  for involving me in the care of this pleasant patient, and I will continue to update you with her progress.  Glade Lloyd, MD [facility] Phone: [phone]  Fax: [phone]   12/09/2019, 7:20 PM  This note was partially dictated with voice recognition software. Similar sounding words can be transcribed inadequately or may not  be corrected upon review.

## 2019-12-09 NOTE — Patient Instructions (Signed)
Advised to hold levothyroxine until next blood work and visit.

## 2019-12-10 ENCOUNTER — Other Ambulatory Visit: Payer: Self-pay

## 2019-12-10 ENCOUNTER — Ambulatory Visit: Payer: Self-pay

## 2019-12-10 ENCOUNTER — Ambulatory Visit (INDEPENDENT_AMBULATORY_CARE_PROVIDER_SITE_OTHER): Payer: BC Managed Care – PPO | Admitting: Orthopaedic Surgery

## 2019-12-10 DIAGNOSIS — M25512 Pain in left shoulder: Secondary | ICD-10-CM | POA: Diagnosis not present

## 2019-12-10 DIAGNOSIS — M542 Cervicalgia: Secondary | ICD-10-CM | POA: Diagnosis not present

## 2019-12-10 MED ORDER — METHOCARBAMOL 500 MG PO TABS: 500.0000 mg | ORAL_TABLET | Freq: Two times a day (BID) | ORAL | 0 refills | Status: DC | PRN

## 2019-12-10 MED ORDER — PREDNISONE 10 MG (21) PO TBPK: ORAL_TABLET | ORAL | 0 refills | Status: DC

## 2019-12-10 NOTE — Progress Notes (Signed)
Office Visit Note   Patient: Melissa Schmidt           Date of Birth: Apr 19, 1975           MRN: [id number] Visit Date: 12/10/2019              Requested by: Andres Shad, MD [address] [place] [id number] PCP: Andres Shad, MD   Assessment & Plan: Visit Diagnoses:  1. Acute pain of left shoulder   2. Neck pain     Plan: Impression is left sided cervical strain and left shoulder bursitis.  Have called in a steroid taper and muscle relaxer to hopefully help with her symptoms.  Should her symptoms not improve over the next several weeks she will come back in for repeat evaluation.  Follow-Up Instructions: Return if symptoms worsen or fail to improve.   Orders:  Orders Placed This Encounter  Procedures  . XR Shoulder Left  . XR Cervical Spine 2 or 3 views   Meds ordered this encounter  Medications  . predniSONE (STERAPRED UNI-PAK 21 TAB) 10 MG (21) TBPK tablet    Sig: Take as prescribed    Dispense:  21 tablet    Refill:  0  . methocarbamol (ROBAXIN) 500 MG tablet    Sig: Take 1 tablet (500 mg total) by mouth 2 (two) times daily as needed.    Dispense:  20 tablet    Refill:  0      Procedures: No procedures performed   Clinical Data: No additional findings.   Subjective: Chief Complaint  Patient presents with  . Left Shoulder - Pain  . Neck - Pain    HPI patient is a pleasant 44 year old female who presents to our clinic today following an injury to her left shoulder.  She was stepping off of a hay ride when she slipped catching herself with her left arm slightly going into an internally rotated position.  She has had pain to the superior aspect of her shoulder as well as into the parascapular region since.  She has had increased pain when trying to put on her seatbelt as well as when she internally rotates her shoulder to put on her bra.  She has been taking Tylenol and using a heating pad without significant relief of symptoms.  She denies  any numbness, tingling or burning.  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  Review of Systems as detailed in HPI.  All other reviewed and are negative.   Objective: Vital Signs: There were no vitals taken for this visit.  Physical Exam well-developed well-nourished female no acute distress.  Alert and oriented x3.  Ortho Exam left shoulder exam reveals near full range of motion with the exception of internal rotation for which she can get to her back pocket.  She has full strength throughout.  Negative empty can.  She does have moderate tenderness to the [facility] joint.  She is neurovascular intact distally.  Cervical spine exam shows mild limitation with range of motion secondary to stiffness.  Specialty Comments:  No specialty comments available.  Imaging: XR Cervical Spine 2 or 3 views  Result Date: 12/10/2019 Straightening of the cervical spine with multilevel spondylosis  XR Shoulder Left  Result Date: 12/10/2019 No acute or structural abnormalities    PMFS History: Patient Active Problem List   Diagnosis Date Noted  . 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  . Hemorrhoids 12/11/2018  . Graves' disease 11/27/2018  .  RUQ abdominal pain 10/10/2018  . Celiac disease 10/10/2018  . GERD (gastroesophageal reflux disease) 10/10/2018  . Hyperthyroidism 10/10/2017  . Uncontrolled type 2 diabetes mellitus with hyperglycemia ([place]) 10/10/2017  . Mucosal abnormality of stomach   . Dysphagia   . Positive autoantibody screening for celiac disease 04/29/2015  . Fatty liver 03/19/2015  . Col[place] RUQ abdominal pain 03/19/2015  . RLQ abdominal pain 03/19/2015  . Rectal bleeding 03/19/2015   Past Medical History:  Diagnosis Date  . Arthritis   . Borderline diabetes   . Celiac disease   . Complication of anesthesia    shaking post anesthesia  . Diabetes mellitus without complication ([place])   . Hemorrhoids   . Hypertriglyceridemia   . PCOS (polycystic  ovarian syndrome)     Family History  Problem Relation Age of Onset  . Irritable bowel syndrome Mother   . Inflammatory bowel disease Neg Hx   . Colon cancer Neg Hx   . Liver disease Neg Hx     Past Surgical History:  Procedure Laterality Date  . APPENDECTOMY  2002  . COLONOSCOPY WITH PROPOFOL N/A 05/25/2015   Dr.Rourk- non-bleeding hemorrhoids, the examination was o/w normal  . ESOPHAGEAL DILATION N/A 05/25/2015   Procedure: ESOPHAGEAL DILATION;  Surgeon: Daneil Dolin, MD;  Location[facility];  Service: Endoscopy;  Laterality: N/A;  . ESOPHAGOGASTRODUODENOSCOPY  04/2017   [place]: EGD with irregular Z-line s/p biopsy, benign-appearing esophageal stenosis s/p dilation, normal examined duodenum but no biopsies of duodenum. Pathology with junctional type GE mucosa with chronic inflammation and surface erosion, no metaplasia, negative dysplasia.   Marland Kitchen ESOPHAGOGASTRODUODENOSCOPY (EGD) WITH PROPOFOL N/A 05/25/2015   Dr.Rourk- normal esophagus, dilated, gastric erosions, intact fundoplication, normal third portion of the duodenum, non-bleeding erosive gastropathy. duodenum bx= benign small bowel mucosa with patchy increased intraepithelial lymphocytes, mild villous blunting and mild crypt hyperplasia, no dysplasia or malignancy stomach bx= erosive gastritis with reactive changes  . NISSEN FUNDOPLICATION  4982   Social History   Occupational History  . Occupation: in school  Tobacco Use  . Smoking status: Never Smoker  . Smokeless tobacco: Never Used  Vaping Use  . Vaping Use: Never used  Substance and Sexual Activity  . Alcohol use: No    Alcohol/week: 0.0 standard drinks  . Drug use: No  . Sexual activity: Yes    Birth control/protection: None

## 2019-12-11 ENCOUNTER — Other Ambulatory Visit: Payer: Self-pay

## 2019-12-11 ENCOUNTER — Telehealth: Payer: Self-pay | Admitting: Internal Medicine

## 2019-12-11 ENCOUNTER — Encounter: Payer: Self-pay | Admitting: *Deleted

## 2019-12-11 DIAGNOSIS — R1011 Right upper quadrant pain: Secondary | ICD-10-CM

## 2019-12-11 DIAGNOSIS — R109 Unspecified abdominal pain: Secondary | ICD-10-CM

## 2019-12-11 NOTE — Telephone Encounter (Signed)
Pt returned call. She is having pain where her gallbladder and liver is located x 1 month.  Pain comes and goes. Pt says the pain gets severe. Pain can be felt when bending over and when pt is laying in the bed. Pt still has an appetite and drinks fluids. No vomiting and nausea is seldom.  Pt is having a BM daily and stools aren't watery. Pt reports that she has a hx of celiac disease but doesn't feel that it's that. Pt is trying to avoid gluten in her dt as much as possible.

## 2019-12-11 NOTE — Telephone Encounter (Signed)
Left a detailed message for pt. Lab orders placed and faxed to [facility].

## 2019-12-11 NOTE — Telephone Encounter (Signed)
Lmom, waiting on a return call.  

## 2019-12-11 NOTE — Telephone Encounter (Signed)
No gallstones on imaging in Sept 2020.   Let's get HFP and RUQ [place].

## 2019-12-11 NOTE — Telephone Encounter (Signed)
[phone] patient needs to speak to a nurse when you are available, about her stomach pain

## 2019-12-11 NOTE — Telephone Encounter (Signed)
mychart message sent with [place] appt details

## 2019-12-11 NOTE — Addendum Note (Signed)
Addended by: Cheron Every on: 12/11/2019 03:58 PM   Modules accepted: Orders

## 2019-12-17 ENCOUNTER — Other Ambulatory Visit: Payer: Self-pay

## 2019-12-17 ENCOUNTER — Ambulatory Visit (HOSPITAL_COMMUNITY)
Admission: RE | Admit: 2019-12-17 | Discharge: 2019-12-17 | Disposition: A | Discharge status: Home / Self Care | Payer: BC Managed Care – PPO | Source: Ambulatory Visit | Attending: Gastroenterology | Admitting: Gastroenterology

## 2019-12-17 DIAGNOSIS — R1011 Right upper quadrant pain: Secondary | ICD-10-CM | POA: Diagnosis not present

## 2019-12-18 LAB — HEPATIC FUNCTION PANEL
AG Ratio: 1.3 (calc) (ref 1.0–2.5)
ALT: 16 U/L (ref 6–29)
AST: 15 U/L (ref 10–30)
Albumin: 3.9 g/dL (ref 3.6–5.1)
Alkaline phosphatase (APISO): 76 U/L (ref 31–125)
Bilirubin, Direct: 0.1 mg/dL (ref 0.0–0.2)
Globulin: 2.9 g/dL (calc) (ref 1.9–3.7)
Indirect Bilirubin: 0.2 mg/dL (calc) (ref 0.2–1.2)
Total Bilirubin: 0.3 mg/dL (ref 0.2–1.2)
Total Protein: 6.8 g/dL (ref 6.1–8.1)

## 2019-12-19 ENCOUNTER — Telehealth: Payer: Self-pay | Admitting: Gastroenterology

## 2019-12-19 ENCOUNTER — Telehealth (INDEPENDENT_AMBULATORY_CARE_PROVIDER_SITE_OTHER): Payer: BC Managed Care – PPO | Admitting: "Endocrinology

## 2019-12-19 ENCOUNTER — Encounter: Payer: Self-pay | Admitting: "Endocrinology

## 2019-12-19 VITALS — Ht 63.0 in | Wt 288.0 lb

## 2019-12-19 DIAGNOSIS — R1011 Right upper quadrant pain: Secondary | ICD-10-CM

## 2019-12-19 DIAGNOSIS — E0590 Thyrotoxicosis, unspecified without thyrotoxic crisis or storm: Secondary | ICD-10-CM

## 2019-12-19 LAB — T3, FREE: T3, Free: 7.3 pg/mL — ABNORMAL HIGH (ref 2.0–4.4)

## 2019-12-19 LAB — TSH: TSH: <0.005 u[IU]/mL — ABNORMAL LOW (ref 0.450–4.500)

## 2019-12-19 LAB — T4, FREE: Free T4: 2.15 ng/dL — ABNORMAL HIGH (ref 0.82–1.77)

## 2019-12-19 MED ORDER — PREDNISONE 10 MG PO TABS: 10.0000 mg | ORAL_TABLET | Freq: Every day | ORAL | 0 refills | Status: DC

## 2019-12-19 MED ORDER — PROPRANOLOL HCL 40 MG PO TABS: 40.0000 mg | ORAL_TABLET | Freq: Two times a day (BID) | ORAL | 1 refills | Status: DC

## 2019-12-19 NOTE — Telephone Encounter (Signed)
[facility] clinical pool, please arrange CT abd/pelvis due to RUQ Pain. [place] negative.

## 2019-12-19 NOTE — Telephone Encounter (Signed)
mychart message sent with appt information

## 2019-12-19 NOTE — Telephone Encounter (Signed)
[facility] clinical pool: she will need to hold metformin day of scan and for 48 hours after.

## 2019-12-19 NOTE — Addendum Note (Signed)
Addended by: Cheron Every on: 12/19/2019 08:52 AM   Modules accepted: Orders

## 2019-12-19 NOTE — Progress Notes (Signed)
12/19/2019                                             Endocrinology Telehealth Visit Follow up Note -During COVID -19 Pandemic  This visit type was conducted  via telephone due to national recommendations for restrictions regarding the COVID-19 Pandemic  in an effort to limit this patient's exposure and mitigate transmission of the corona virus.   I connected with Melissa Schmidt on 12/19/2019   by telephone and verified that I am speaking with the correct person using two identifiers. Melissa Schmidt, September 12, 1975. she has verbally consented to this visit.  I was in my office and patient was in her residence. No other persons were with me during the encounter. All issues noted in this document were discussed and addressed. The format was not optimal for physical exam.   Subjective:    Patient ID: Melissa Schmidt, female    DOB: December 25, 1975, PCP Melissa Shad, MD.   Past Medical History:  Diagnosis Date  . Arthritis   . Borderline diabetes   . Celiac disease   . Complication of anesthesia    shaking post anesthesia  . Diabetes mellitus without complication ([place])   . Hemorrhoids   . Hypertriglyceridemia   . PCOS (polycystic ovarian syndrome)    Past Surgical History:  Procedure Laterality Date  . APPENDECTOMY  2002  . COLONOSCOPY WITH PROPOFOL N/A 05/25/2015   Dr.Rourk- non-bleeding hemorrhoids, the examination was o/w normal  . ESOPHAGEAL DILATION N/A 05/25/2015   Procedure: ESOPHAGEAL DILATION;  Surgeon: Daneil Dolin, MD;  Location: [facility];  Service: Endoscopy;  Laterality: N/A;  . ESOPHAGOGASTRODUODENOSCOPY  04/2017   [place]: EGD with irregular Z-line s/p biopsy, benign-appearing esophageal stenosis s/p dilation, normal examined duodenum but no biopsies of duodenum. Pathology with junctional type GE mucosa with chronic inflammation and surface erosion, no metaplasia, negative dysplasia.   Marland Kitchen ESOPHAGOGASTRODUODENOSCOPY (EGD) WITH PROPOFOL N/A 05/25/2015   Dr.Rourk- normal  esophagus, dilated, gastric erosions, intact fundoplication, normal third portion of the duodenum, non-bleeding erosive gastropathy. duodenum bx= benign small bowel mucosa with patchy increased intraepithelial lymphocytes, mild villous blunting and mild crypt hyperplasia, no dysplasia or malignancy stomach bx= erosive gastritis with reactive changes  . NISSEN FUNDOPLICATION  5681   Social History   Socioeconomic History  . Marital status: Married    Spouse name: Not on file  . Number of children: 1  . Years of education: Not on file  . Highest education level: Not on file  Occupational History  . Occupation: in school  Tobacco Use  . Smoking status: Never Smoker  . Smokeless tobacco: Never Used  Vaping Use  . Vaping Use: Never used  Substance and Sexual Activity  . Alcohol use: No    Alcohol/week: 0.0 standard drinks  . Drug use: No  . Sexual activity: Yes    Birth control/protection: None  Other Topics Concern  . Not on file  Social History Narrative  . Not on file   Social Determinants of Health   Financial Resource Strain:   . Difficulty of Paying Living Expenses: Not on file  Food Insecurity:   . Worried About Charity fundraiser in the Last Year: Not on file  . Ran Out of Food in the Last Year: Not on file  Transportation Needs:   . Lack of Transportation (  Medical): Not on file  . Lack of Transportation (Non-Medical): Not on file  Physical Activity:   . Days of Exercise per Week: Not on file  . Minutes of Exercise per Session: Not on file  Stress:   . Feeling of Stress : Not on file  Social Connections:   . Frequency of Communication with Friends and Family: Not on file  . Frequency of Social Gatherings with Friends and Family: Not on file  . Attends Religious Services: Not on file  . Active Member of Clubs or Organizations: Not on file  . Attends Archivist Meetings: Not on file  . Marital Status: Not on file   Outpatient Encounter Medications as of  12/19/2019  Medication Sig  . Ascorbic Acid (VITAMIN C ADULT GUMMIES PO) Take by mouth daily.  Marland Kitchen azelastine (ASTELIN) 0.1 % nasal spray Place 1 spray into both nostrils 2 (two) times daily. Use in each nostril as directed  . BREO ELLIPTA 200-25 MCG/INH AEPB Inhale 1 puff into the lungs daily.  . ferrous sulfate 325 (65 FE) MG EC tablet Take 325 mg by mouth daily with breakfast.  . fluticasone (FLONASE) 50 MCG/ACT nasal spray Place 2 sprays into both nostrils as needed.   Marland Kitchen glimepiride (AMARYL) 1 MG tablet Take 1 mg by mouth daily.  . hydrocortisone (ANUSOL-HC) 2.5 % rectal cream Place 1 application rectally 2 (two) times daily. (Patient taking differently: Place 1 application rectally as needed. )  . lisinopril (ZESTRIL) 10 MG tablet Take 10 mg by mouth daily.  . metFORMIN (GLUCOPHAGE) 1000 MG tablet Take 1,000 mg by mouth 2 (two) times daily with a meal.   . methocarbamol (ROBAXIN) 500 MG tablet Take 1 tablet (500 mg total) by mouth 2 (two) times daily as needed.  . Multiple Vitamin (MULTIVITAMIN) capsule Take 1 capsule by mouth daily.  . pantoprazole (PROTONIX) 40 MG tablet Take 1 tablet (40 mg total) by mouth daily before breakfast.  . predniSONE (DELTASONE) 10 MG tablet Take 1 tablet (10 mg total) by mouth daily with breakfast.  . propranolol (INDERAL) 40 MG tablet Take 1 tablet (40 mg total) by mouth 2 (two) times daily.  Marland Kitchen VITAMIN D PO Take 2,000 mg by mouth daily.  . [DISCONTINUED] EUTHYROX 112 MCG tablet TAKE 1 TABLET BY MOUTH BEFORE BREAKFAST (Patient not taking: Reported on 12/19/2019)  . [DISCONTINUED] Omega-3 Fatty Acids (OMEGA-3 FISH OIL PO) Take by mouth 3 (three) times daily.   . [DISCONTINUED] predniSONE (STERAPRED UNI-PAK 21 TAB) 10 MG (21) TBPK tablet Take as prescribed   No facility-administered encounter medications on file as of 12/19/2019.    ALLERGIES: Allergies  Allergen Reactions  . Singulair [Montelukast]     VACCINATION STATUS:  There is no immunization  history on file for this patient.   HPI  Melissa Schmidt is 44 y.o. female who is being seen in follow-up for her history of RAI induced hypothyroidism.   She was given I-131 thyroid ablation for clinically significant primary hyperthyroidism from Graves' disease.   -This treatment was administered on December 12, 2018 after her thyroid uptake showed 68% in 24 hours  suggesting Graves' disease. -For RAI induced hypothyroidism, she was subsequently initiated on levothyroxine.  However, during her last presentation she was found to have relapse of thyrotoxicosis.  She was advised to discontinue thyroid hormone for repeat labs which still shows hypothyroidism.  She has symptoms including palpitations, tremors, heat intolerance.  -She has lost 4 to 5 pound weight. she denies  family history of thyroid dysfunction nor any thyroid cancer.                            Review of systems  Limited as above  Objective:    Ht 5' 3"  (1.6 m)   Wt 288 lb (130.6 kg)   BMI 51.02 kg/m   Wt Readings from Last 3 Encounters:  12/19/19 288 lb (130.6 kg)  09/05/19 (!) 289 lb 12.8 oz (131.5 kg)  05/31/19 292 lb 3.2 oz (132.5 kg)                   Component Value Date/Time   NA 140 11/05/2018 1551   K 4.6 11/05/2018 1551   CL 103 11/05/2018 1551   CO2 25 11/05/2018 1551   GLUCOSE 55 (L) 11/05/2018 1551   GLUCOSE 163 (H) 05/21/2015 1010   BUN 14 11/22/2019 0000   CREATININE 0.6 11/22/2019 0000   CREATININE 0.71 11/05/2018 1551   CALCIUM 9.4 11/22/2019 0000   PROT 6.8 12/17/2019 1106   PROT 6.8 11/05/2018 1551   ALBUMIN 4.2 11/05/2018 1551   AST 15 12/17/2019 1106   ALT 16 12/17/2019 1106   ALKPHOS 89 11/05/2018 1551   BILITOT 0.3 12/17/2019 1106   BILITOT <0.2 11/05/2018 1551   GFRNONAA 113 11/22/2019 0000   GFRAA 131 11/22/2019 0000     CBC    Component Value Date/Time   WBC 11.9 (H) 12/06/2019 1426   WBC 9.6 05/21/2015 1010   RBC 5.13 12/06/2019 1426   RBC 4.41 05/21/2015 1010   HGB  11.5 12/06/2019 1426   HCT 36.6 12/06/2019 1426   PLT 452 (H) 12/06/2019 1426   MCV 71 (L) 12/06/2019 1426   MCH 22.4 (L) 12/06/2019 1426   MCH 25.4 (L) 05/21/2015 1010   MCHC 31.4 (L) 12/06/2019 1426   MCHC 31.5 05/21/2015 1010   RDW 15.4 12/06/2019 1426   LYMPHSABS 3.6 (H) 12/06/2019 1426   EOSABS 0.2 12/06/2019 1426   BASOSABS 0.0 12/06/2019 1426     Diabetic Labs (most recent): Lab Results  Component Value Date   HGBA1C 6.2 11/22/2019   HGBA1C 5.9 07/31/2017    Lipid Panel     Component Value Date/Time   CHOL 146 11/22/2019 0000   TRIG 156 11/22/2019 0000   HDL 41 11/22/2019 0000   LDLCALC 78 11/22/2019 0000     Thyroid uptake and scan on October 20, 20 FINDINGS: Homogeneous tracer accumulation within both thyroid lobes, diffusely increased.  No focal areas of increased or decreased tracer localization. 4 hour I-123 uptake = 57% (normal 5-20%) 24 hour I-123 uptake = 68% (normal 10-30%)  IMPRESSION: Diffusely increased homogeneous tracer accumulation in both thyroid lobes.  Increased 4 hour and 24 hour radio iodine uptakes consistent with hyperthyroidism. Findings consistent with Graves disease.  I-131 thyroid ablation on December 12, 2018   Recent Results (from the past 2160 hour(s))  Basic metabolic panel     Status: None   Collection Time: 11/22/19 12:00 AM  Result Value Ref Range   BUN 14 4 - 21   Creatinine 0.6 0.5 - 1.1  Comprehensive metabolic panel     Status: None   Collection Time: 11/22/19 12:00 AM  Result Value Ref Range   GFR calc Af Amer 131    GFR calc non Af Amer 113    Calcium 9.4 8.7 - 10.7  Lipid panel     Status: None  Collection Time: 11/22/19 12:00 AM  Result Value Ref Range   Triglycerides 156 40 - 160   Cholesterol 146 0 - 200   HDL 41 35 - 70   LDL Cholesterol 78   Hemoglobin A1c     Status: None   Collection Time: 11/22/19 12:00 AM  Result Value Ref Range   Hemoglobin A1C 6.2   TSH     Status: Abnormal    Collection Time: 11/22/19 12:00 AM  Result Value Ref Range   TSH 0.01 (A) 0.41 - 5.90    Comment: T4- 15.7, T3- 31  T4, free     Status: Abnormal   Collection Time: 12/06/19  7:37 AM  Result Value Ref Range   Free T4 2.89 (H) 0.82 - 1.77 ng/dL  TSH     Status: Abnormal   Collection Time: 12/06/19  7:37 AM  Result Value Ref Range   TSH <0.005 (L) 0.450 - 4.500 uIU/mL  CBC with Differential     Status: Abnormal   Collection Time: 12/06/19  2:26 PM  Result Value Ref Range   WBC 11.9 (H) 3.4 - 10.8 x10E3/uL   RBC 5.13 3.77 - 5.28 x10E6/uL   Hemoglobin 11.5 11.1 - 15.9 g/dL   Hematocrit 36.6 34.0 - 46.6 %   MCV 71 (L) 79 - 97 fL   MCH 22.4 (L) 26.6 - 33.0 pg   MCHC 31.4 (L) 31 - 35 g/dL   RDW 15.4 11.7 - 15.4 %   Platelets 452 (H) 150 - 450 x10E3/uL   Neutrophils 61 Not Estab. %   Lymphs 30 Not Estab. %   Monocytes 7 Not Estab. %   Eos 2 Not Estab. %   Basos 0 Not Estab. %   Neutrophils Absolute 7.3 (H) 1.40 - 7.00 x10E3/uL   Lymphocytes Absolute 3.6 (H) 0 - 3 x10E3/uL   Monocytes Absolute 0.8 0 - 0 x10E3/uL   EOS (ABSOLUTE) 0.2 0.0 - 0.4 x10E3/uL   Basophils Absolute 0.0 0 - 0 x10E3/uL   Immature Granulocytes 0 Not Estab. %   Immature Grans (Abs) 0.0 0.0 - 0.1 x10E3/uL  Iron, TIBC and Ferritin Panel     Status: Abnormal   Collection Time: 12/06/19  2:26 PM  Result Value Ref Range   Total Iron Binding Capacity 270 250 - 450 ug/dL   UIBC 246 131 - 425 ug/dL   Iron 24 (L) 27 - 159 ug/dL   Iron Saturation 9 (LL) 15 - 55 %   Ferritin 48 15.0 - 150.0 ng/mL  Hepatic function panel     Status: None   Collection Time: 12/17/19 11:06 AM  Result Value Ref Range   Total Protein 6.8 6.1 - 8.1 g/dL   Albumin 3.9 3.6 - 5.1 g/dL   Globulin 2.9 1.9 - 3.7 g/dL (calc)   AG Ratio 1.3 1.0 - 2.5 (calc)   Total Bilirubin 0.3 0.2 - 1.2 mg/dL   Bilirubin, Direct 0.1 0.0 - 0.2 mg/dL   Indirect Bilirubin 0.2 0.2 - 1.2 mg/dL (calc)   Alkaline phosphatase (APISO) 76 31 - 125 U/L   AST 15 10 -  30 U/L   ALT 16 6 - 29 U/L  TSH     Status: Abnormal   Collection Time: 12/18/19  1:06 PM  Result Value Ref Range   TSH <0.005 (L) 0.450 - 4.500 uIU/mL  T4, free     Status: Abnormal   Collection Time: 12/18/19  1:06 PM  Result Value Ref Range  Free T4 2.15 (H) 0.82 - 1.77 ng/dL  T3, free     Status: Abnormal   Collection Time: 12/18/19  1:06 PM  Result Value Ref Range   T3, Free 7.3 (H) 2.0 - 4.4 pg/mL      Assessment & Plan:   1.  RAI induced hypothyroidism  2. hyperthyroidism due to Graves' disease- resolved 3.  Type 2 diabetes -She is status post I-131 thyroid ablative treatment on December 12, 2018.  -Her thyroid function tests are still consistent with treatment failure/only partial response.  She may need a second dose of radioactive iodine to ablate her thyroid.  She will need uptake and scan prior to next ablation.  This study will be scheduled to be done after 10 days, total of 3 weeks off of thyroid hormone.  In the meantime, she may benefit from beta-blocker and steroids course.  I discussed and prescribed propanolol 40 mg p.o. twice daily, prednisone 10 mg p.o. daily to help her with symptoms..   -Regarding her type 2 diabetes, her A1c is 6.1%.  She is on Metformin 1000 mg p.o. twice daily.  She will have point-of-care A1c on subsequent visit.   - I advised her to maintain close follow up with Melissa Shad, MD for primary care needs.     - Time spent on this patient care encounter:  20 minutes of which 50% was spent in  counseling and the rest reviewing  her current and  previous labs / studies and medications  doses and developing a plan for long term care. Melissa Schmidt  participated in the discussions, expressed understanding, and voiced agreement with the above plans.  All questions were answered to her satisfaction. she is encouraged to contact clinic should she have any questions or concerns prior to her return visit.   Follow up plan: Return in about 3  weeks (around 01/09/2020) for F/U with Thyroid Uptake and Scan, F/U with Pre-visit Labs.   Thank you for involving me in the care of this pleasant patient, and I will continue to update you with her progress.  Glade Lloyd, MD [facility] Phone: [phone]  Fax: [phone]   12/19/2019, 2:29 PM  This note was partially dictated with voice recognition software. Similar sounding words can be transcribed inadequately or may not  be corrected upon review.

## 2019-12-19 NOTE — Telephone Encounter (Signed)
PA approved via [employer]. Auth# [id number] dates 12/19/2019-01/18/2020

## 2019-12-31 ENCOUNTER — Other Ambulatory Visit: Payer: Self-pay

## 2019-12-31 ENCOUNTER — Ambulatory Visit (INDEPENDENT_AMBULATORY_CARE_PROVIDER_SITE_OTHER): Payer: BC Managed Care – PPO | Admitting: Gastroenterology

## 2019-12-31 ENCOUNTER — Encounter: Payer: Self-pay | Admitting: Gastroenterology

## 2019-12-31 VITALS — BP 133/80 | HR 83 | Temp 97.1°F | Ht 63.0 in | Wt 295.6 lb

## 2019-12-31 DIAGNOSIS — D509 Iron deficiency anemia, unspecified: Secondary | ICD-10-CM

## 2019-12-31 DIAGNOSIS — K900 Celiac disease: Secondary | ICD-10-CM | POA: Diagnosis not present

## 2019-12-31 DIAGNOSIS — R1031 Right lower quadrant pain: Secondary | ICD-10-CM | POA: Diagnosis not present

## 2019-12-31 DIAGNOSIS — R1011 Right upper quadrant pain: Secondary | ICD-10-CM

## 2019-12-31 DIAGNOSIS — R1319 Other dysphagia: Secondary | ICD-10-CM

## 2019-12-31 DIAGNOSIS — K760 Fatty (change of) liver, not elsewhere classified: Secondary | ICD-10-CM

## 2019-12-31 DIAGNOSIS — K219 Gastro-esophageal reflux disease without esophagitis: Secondary | ICD-10-CM

## 2019-12-31 MED ORDER — PANTOPRAZOLE SODIUM 40 MG PO TBEC: 40.0000 mg | DELAYED_RELEASE_TABLET | Freq: Every day | ORAL | 3 refills | Status: DC

## 2019-12-31 NOTE — Patient Instructions (Addendum)
1. CT as scheduled. 2. Please have blood work done today as discussed. 3. Continue pantoprazole 40 mg daily. 4. Continue iron and vitamin D. 5. We will be in touch with results and further recommendations.

## 2019-12-31 NOTE — Progress Notes (Signed)
Primary Care Physician: Andres Shad, MD  Primary Gastroenterologist:  Garfield Cornea, MD   Chief Complaint  Patient presents with  . Gastroesophageal Reflux    reports occas reflux  . Abdominal Pain    RUQ    HPI: Melissa Schmidt is a 44 y.o. female with history of celiac disease, diagnosed in 2017, chronic GERD, fatty liver presenting for follow-up.  Patient last seen in May 2021, virtual visit.  History of vitamin D insufficiency February 2021, advised 2000 IUs vitamin D daily.  DEXA scan on file from November 2020. Patient has a history of microcytic indices on CBC with normal ferritin, low normal hemoglobin/hematocrit, low iron sats, low iron.  On ferrous sulfate.   Since her last office visit she called in with complaints of right upper quadrant pain.  She had initially a right upper quadrant ultrasound on November 9th showed no gallstones or gallbladder wall thickening.  Fatty liver noted.  CT abdomen pelvis with contrast has been ordered by Roseanne Kaufman, NP for December 8th.   Right mid abd pain. Worse with bending over to wipe. Stretching out helps. Sometimes eating makes it worse.  Pain last for about 10 minutes at a time.  BM 3-4 times per day. Bad odor since radioactive iodine last year.  Probiotics did not help.  No diarrhea. No melena, brbpr. Heartburn ok on pantoprazole.  History of antireflux surgery but has been back on PPI for 2-3 years. Weight is up 20 pounds in the past year.  No menstrual cycle since February.  She has an IUD.  Continues to have issues with her thyroid.  The size of her goiter has improved that she has to have another scan next week because her thyroid is still overactive.  She may require repeat radioactive iodine.  Dr. Dorris Fetch recently started her on prednisone.  Patient has seen neurology (in [place] due to insurance).  Having issues with short-term memory loss.  She reports MRI of the brain and EEG were normal.  Has referral to  neuropsychology.  Patient states her inflammatory markers were high as well as white blood cell count and they have referred her to hematology and right leg on December 14.     Current Outpatient Medications  Medication Sig Dispense Refill  . Ascorbic Acid (VITAMIN C ADULT GUMMIES PO) Take by mouth daily.    Marland Kitchen azelastine (ASTELIN) 0.1 % nasal spray Place 1 spray into both nostrils 2 (two) times daily. Use in each nostril as directed    . BREO ELLIPTA 200-25 MCG/INH AEPB Inhale 1 puff into the lungs daily.    . ferrous sulfate 325 (65 FE) MG EC tablet Take 325 mg by mouth daily with breakfast.    . fluticasone (FLONASE) 50 MCG/ACT nasal spray Place 2 sprays into both nostrils as needed.     Marland Kitchen glimepiride (AMARYL) 1 MG tablet Take 1 mg by mouth daily.    . hydrocortisone (ANUSOL-HC) 2.5 % rectal cream Place 1 application rectally 2 (two) times daily. (Patient taking differently: Place 1 application rectally as needed. ) 30 g 1  . lisinopril (ZESTRIL) 10 MG tablet Take 10 mg by mouth daily.    . metFORMIN (GLUCOPHAGE) 1000 MG tablet Take 1,000 mg by mouth 2 (two) times daily with a meal.     . methocarbamol (ROBAXIN) 500 MG tablet Take 1 tablet (500 mg total) by mouth 2 (two) times daily as needed. 20 tablet 0  . Multiple Vitamin (MULTIVITAMIN)  capsule Take 1 capsule by mouth daily.    . pantoprazole (PROTONIX) 40 MG tablet Take 1 tablet (40 mg total) by mouth daily before breakfast. 90 tablet 3  . predniSONE (DELTASONE) 10 MG tablet Take 1 tablet (10 mg total) by mouth daily with breakfast. 30 tablet 0  . propranolol (INDERAL) 40 MG tablet Take 1 tablet (40 mg total) by mouth 2 (two) times daily. 60 tablet 1  . VITAMIN D PO Take 2,000 mg by mouth daily.     No current facility-administered medications for this visit.    Allergies as of 12/31/2019 - Review Complete 12/31/2019  Allergen Reaction Noted  . Singulair [montelukast]  05/31/2019    ROS:  General: Negative for anorexia, weight  loss, fever, chills, fatigue, weakness. ENT: Negative for hoarseness, difficulty swallowing , nasal congestion. CV: Negative for chest pain, angina, palpitations, dyspnea on exertion, peripheral edema.  Respiratory: Negative for dyspnea at rest, dyspnea on exertion, cough, sputum, wheezing.  GI: See history of present illness. GU:  Negative for dysuria, hematuria, urinary incontinence, urinary frequency, nocturnal urination.  Endo: Negative for unusual weight change.    Physical Examination:   BP 133/80   Pulse 83   Temp (!) 97.1 F (36.2 C)   Ht 5' 3"  (1.6 m)   Wt 295 lb 9.6 oz (134.1 kg)   LMP 03/11/2019   BMI 52.36 kg/m   General: Morbidly obese female in no acute distress.  Eyes: No icterus. Mouth: Masked. Lungs: Clear to auscultation bilaterally.  Heart: Regular rate and rhythm, no murmurs rubs or gallops.  Abdomen: Bowel sounds are normal, nontender, nondistended, no hepatosplenomegaly or masses, no abdominal bruits or hernia , no rebound or guarding.  Exam limited by body habitus. Extremities: No lower extremity edema. No clubbing or deformities. Neuro: Alert and oriented x 4   Skin: Warm and dry, no jaundice.   Psych: Alert and cooperative, normal mood and affect.  Labs:  Lab Results  Component Value Date   CREATININE 0.6 11/22/2019   BUN 14 11/22/2019   NA 140 11/05/2018   K 4.6 11/05/2018   CL 103 11/05/2018   CO2 25 11/05/2018   Lab Results  Component Value Date   ALT 16 12/17/2019   AST 15 12/17/2019   ALKPHOS 89 11/05/2018   BILITOT 0.3 12/17/2019   Lab Results  Component Value Date   WBC 11.9 (H) 12/06/2019   HGB 11.5 12/06/2019   HCT 36.6 12/06/2019   MCV 71 (L) 12/06/2019   PLT 452 (H) 12/06/2019   Lab Results  Component Value Date   IRON 24 (L) 12/06/2019   TIBC 270 12/06/2019   FERRITIN 48 12/06/2019   Lab Results  Component Value Date   VITAMINB12 447 11/05/2018   Lab Results  Component Value Date   FOLATE 8.0 11/05/2018      Imaging Studies: XR Cervical Spine 2 or 3 views  Result Date: 12/11/2019 Straightening of the cervical spine with multilevel spondylosis  XR Shoulder Left  Result Date: 12/11/2019 No acute or structural abnormalities  US ABDOMEN LIMITED RUQ (LIVER/GB)  Result Date: 12/17/2019 CLINICAL DATA:  44 year old female with right upper quadrant abdominal pain. EXAM: ULTRASOUND ABDOMEN LIMITED RIGHT UPPER QUADRANT COMPARISON:  Right upper quadrant ultrasound dated 10/17/2018. FINDINGS: Gallbladder: No gallstones or wall thickening visualized. No sonographic Murphy sign noted by sonographer. Common bile duct: Diameter: 3 mm Liver: There is diffuse increased liver echogenicity most commonly seen in the setting of fatty infiltration. Superimposed inflammation or fibrosis  is not excluded. Clinical correlation is recommended. Portal vein is patent on color Doppler imaging with normal direction of blood flow towards the liver. Other: None. IMPRESSION: Fatty liver, otherwise unremarkable right upper quadrant ultrasound. Electronically Signed   By: Anner Crete M.D.   On: 12/17/2019 21:59   Impression/plan 44 year old female with history of celiac disease, diagnosed in 2017, chronic GERD, fatty liver, vitamin D insufficiency presents for follow-up.  She also complains of right upper quadrant pain.  Celiac disease: She remains on gluten-free diet.  TTG last year was normal.  She is not aware of any potential gluten exposure.  DEXA scan on file November 2020 and normal.  She is on vitamin D 2000 IU daily because of history of vitamin D insufficiency, improved on recheck, remains low normal.  Also with low iron indices on ferrous sulfate.  Chronic GERD: Well-controlled on current regimen.  Fatty liver: LFTs normal November 2021.  Remains with significant obesity.  Currently issues with Graves' disease.  Weight up 20 pounds over the past 1 year.  Recommend slow gradual weight loss.  Increase physical activity as  tolerated.  Malodorous stool: She notes since radioactive iodine treatment last year.  No diarrhea or blood in the stool.  Etiology unclear.  No improvement with probiotics.  1. CT A/P as scheduled. 2. Labs to check TTG levels. 3. Continue pantoprazole 40 mg daily. 4. Continue iron and vitamin D. 5. We will be in touch with results and further recommendations.

## 2020-01-01 ENCOUNTER — Telehealth: Payer: Self-pay | Admitting: Gastroenterology

## 2020-01-01 NOTE — Telephone Encounter (Signed)
Records received from [facility] neurology: ANA negative, B12 490, folate 7, CRP 33 high.  Await pending labs.

## 2020-01-06 ENCOUNTER — Ambulatory Visit (HOSPITAL_COMMUNITY): Payer: BC Managed Care – PPO

## 2020-01-06 ENCOUNTER — Encounter (HOSPITAL_COMMUNITY): Payer: Self-pay

## 2020-01-07 ENCOUNTER — Encounter (HOSPITAL_COMMUNITY): Payer: BC Managed Care – PPO

## 2020-01-13 ENCOUNTER — Ambulatory Visit: Payer: BC Managed Care – PPO | Admitting: "Endocrinology

## 2020-01-14 ENCOUNTER — Encounter (HOSPITAL_COMMUNITY)
Admission: RE | Admit: 2020-01-14 | Discharge: 2020-01-14 | Disposition: A | Discharge status: Home / Self Care | Payer: BC Managed Care – PPO | Source: Ambulatory Visit | Attending: "Endocrinology | Admitting: "Endocrinology

## 2020-01-14 ENCOUNTER — Other Ambulatory Visit: Payer: Self-pay

## 2020-01-14 ENCOUNTER — Ambulatory Visit (HOSPITAL_COMMUNITY): Payer: BC Managed Care – PPO

## 2020-01-14 DIAGNOSIS — E0590 Thyrotoxicosis, unspecified without thyrotoxic crisis or storm: Secondary | ICD-10-CM | POA: Diagnosis not present

## 2020-01-14 MED ORDER — SODIUM IODIDE I-123 7.4 MBQ CAPS
448.0000 | ORAL_CAPSULE | Freq: Once | ORAL | Status: AC
  Administered 2020-01-14: 448 via ORAL

## 2020-01-15 ENCOUNTER — Ambulatory Visit (HOSPITAL_COMMUNITY): Payer: BC Managed Care – PPO

## 2020-01-15 ENCOUNTER — Encounter (HOSPITAL_COMMUNITY)
Admission: RE | Admit: 2020-01-15 | Discharge: 2020-01-15 | Disposition: A | Discharge status: Home / Self Care | Payer: BC Managed Care – PPO | Source: Ambulatory Visit | Attending: "Endocrinology | Admitting: "Endocrinology

## 2020-01-17 ENCOUNTER — Ambulatory Visit (INDEPENDENT_AMBULATORY_CARE_PROVIDER_SITE_OTHER): Payer: BC Managed Care – PPO | Admitting: "Endocrinology

## 2020-01-17 ENCOUNTER — Encounter: Payer: Self-pay | Admitting: "Endocrinology

## 2020-01-17 ENCOUNTER — Other Ambulatory Visit: Payer: Self-pay

## 2020-01-17 VITALS — BP 124/76 | HR 81 | Ht 63.0 in | Wt 296.0 lb

## 2020-01-17 DIAGNOSIS — E0500 Thyrotoxicosis with diffuse goiter without thyrotoxic crisis or storm: Secondary | ICD-10-CM

## 2020-01-17 DIAGNOSIS — E0590 Thyrotoxicosis, unspecified without thyrotoxic crisis or storm: Secondary | ICD-10-CM | POA: Diagnosis not present

## 2020-01-17 LAB — T4, FREE: Free T4: 1.91 ng/dL — ABNORMAL HIGH (ref 0.82–1.77)

## 2020-01-17 LAB — TSH: TSH: <0.005 u[IU]/mL — ABNORMAL LOW (ref 0.450–4.500)

## 2020-01-17 MED ORDER — PREDNISONE 10 MG PO TABS
10.0000 mg | ORAL_TABLET | Freq: Every day | ORAL | 0 refills | Status: DC
Start: 2020-01-17 — End: 2020-06-12

## 2020-01-17 NOTE — Progress Notes (Signed)
01/17/2020           Endocrinology follow-up note  Subjective:    Patient ID: Melissa Schmidt, female    DOB: [birth year], PCP Andres Shad, MD.   Past Medical History:  Diagnosis Date  . Arthritis   . Borderline diabetes   . Celiac disease   . Complication of anesthesia    shaking post anesthesia  . Diabetes mellitus without complication ([place])   . Hemorrhoids   . Hypertriglyceridemia   . PCOS (polycystic ovarian syndrome)    Past Surgical History:  Procedure Laterality Date  . APPENDECTOMY  2002  . COLONOSCOPY WITH PROPOFOL N/A 05/25/2015   Dr.Rourk- non-bleeding hemorrhoids, the examination was o/w normal  . ESOPHAGEAL DILATION N/A 05/25/2015   Procedure: ESOPHAGEAL DILATION;  Surgeon: Daneil Dolin, MD;  Location: [facility];  Service: Endoscopy;  Laterality: N/A;  . ESOPHAGOGASTRODUODENOSCOPY  04/2017   [place]: EGD with irregular Z-line s/p biopsy, benign-appearing esophageal stenosis s/p dilation, normal examined duodenum but no biopsies of duodenum. Pathology with junctional type GE mucosa with chronic inflammation and surface erosion, no metaplasia, negative dysplasia.   Marland Kitchen ESOPHAGOGASTRODUODENOSCOPY (EGD) WITH PROPOFOL N/A 05/25/2015   Dr.Rourk- normal esophagus, dilated, gastric erosions, intact fundoplication, normal third portion of the duodenum, non-bleeding erosive gastropathy. duodenum bx= benign small bowel mucosa with patchy increased intraepithelial lymphocytes, mild villous blunting and mild crypt hyperplasia, no dysplasia or malignancy stomach bx= erosive gastritis with reactive changes  . NISSEN FUNDOPLICATION  7829   Social History   Socioeconomic History  . Marital status: Married    Spouse name: Not on file  . Number of children: 1  . Years of education: Not on file  . Highest education level: Not on file  Occupational History  . Occupation: in school  Tobacco Use  . Smoking status: Never Smoker  . Smokeless tobacco: Never Used  Vaping  Use  . Vaping Use: Never used  Substance and Sexual Activity  . Alcohol use: No    Alcohol/week: 0.0 standard drinks  . Drug use: No  . Sexual activity: Yes    Birth control/protection: None  Other Topics Concern  . Not on file  Social History Narrative  . Not on file   Social Determinants of Health   Financial Resource Strain: Not on file  Food Insecurity: Not on file  Transportation Needs: Not on file  Physical Activity: Not on file  Stress: Not on file  Social Connections: Not on file   Outpatient Encounter Medications as of 01/17/2020  Medication Sig  . Ascorbic Acid (VITAMIN C ADULT GUMMIES PO) Take by mouth daily.  Marland Kitchen azelastine (ASTELIN) 0.1 % nasal spray Place 1 spray into both nostrils 2 (two) times daily. Use in each nostril as directed  . BREO ELLIPTA 200-25 MCG/INH AEPB Inhale 1 puff into the lungs daily.  . ferrous sulfate 325 (65 FE) MG EC tablet Take 325 mg by mouth daily with breakfast.  . fluticasone (FLONASE) 50 MCG/ACT nasal spray Place 2 sprays into both nostrils as needed.   Marland Kitchen glimepiride (AMARYL) 1 MG tablet Take 1 mg by mouth daily.  . hydrocortisone (ANUSOL-HC) 2.5 % rectal cream Place 1 application rectally 2 (two) times daily. (Patient taking differently: Place 1 application rectally as needed. )  . lisinopril (ZESTRIL) 10 MG tablet Take 10 mg by mouth daily.  . metFORMIN (GLUCOPHAGE) 1000 MG tablet Take 1,000 mg by mouth 2 (two) times daily with a meal.   . methocarbamol (ROBAXIN) 500  MG tablet Take 1 tablet (500 mg total) by mouth 2 (two) times daily as needed.  . Multiple Vitamin (MULTIVITAMIN) capsule Take 1 capsule by mouth daily.  . pantoprazole (PROTONIX) 40 MG tablet Take 1 tablet (40 mg total) by mouth daily before breakfast.  . predniSONE (DELTASONE) 10 MG tablet Take 1 tablet (10 mg total) by mouth daily with breakfast.  . propranolol (INDERAL) 40 MG tablet Take 1 tablet (40 mg total) by mouth 2 (two) times daily.  Marland Kitchen VITAMIN D PO Take 2,000 mg  by mouth daily.  . [DISCONTINUED] predniSONE (DELTASONE) 10 MG tablet Take 1 tablet (10 mg total) by mouth daily with breakfast.   No facility-administered encounter medications on file as of 01/17/2020.    ALLERGIES: Allergies  Allergen Reactions  . Singulair [Montelukast]     VACCINATION STATUS:  There is no immunization history on file for this patient.   HPI  Melissa Schmidt is 44 y.o. female who is being seen in follow-up for her history of RAI induced hypothyroidism.   She was given I-131 thyroid ablation for clinically significant primary hyperthyroidism from Graves' disease.   -This treatment was administered on December 12, 2018 after her thyroid uptake showed 68% in 24 hours  suggesting Graves' disease. -For RAI induced hypothyroidism, she was subsequently initiated on levothyroxine.  However, during her last presentation she was found to have relapse of thyrotoxicosis.  She was advised to discontinue thyroid hormone for repeat labs which still shows hypeerthyroidism.  She has symptoms including palpitations, tremors, heat intolerance. Her recent repeat thyroid uptake and scan confirms treatment failure with still active thyroid.  See summary below.  -She has lost 4 to 5 pound weight. she denies family history of thyroid dysfunction nor any thyroid cancer.                            Review of systems  Limited as above  Objective:    BP 124/76   Pulse 81   Ht 5' 3"  (1.6 m)   Wt 296 lb (134.3 kg)   BMI 52.43 kg/m   Wt Readings from Last 3 Encounters:  01/17/20 296 lb (134.3 kg)  12/31/19 295 lb 9.6 oz (134.1 kg)  12/19/19 288 lb (130.6 kg)                   Component Value Date/Time   NA 140 11/05/2018 1551   K 4.6 11/05/2018 1551   CL 103 11/05/2018 1551   CO2 25 11/05/2018 1551   GLUCOSE 55 (L) 11/05/2018 1551   GLUCOSE 163 (H) 05/21/2015 1010   BUN 14 11/22/2019 0000   CREATININE 0.6 11/22/2019 0000   CREATININE 0.71 11/05/2018 1551   CALCIUM 9.4  11/22/2019 0000   PROT 6.8 12/17/2019 1106   PROT 6.8 11/05/2018 1551   ALBUMIN 4.2 11/05/2018 1551   AST 15 12/17/2019 1106   ALT 16 12/17/2019 1106   ALKPHOS 89 11/05/2018 1551   BILITOT 0.3 12/17/2019 1106   BILITOT <0.2 11/05/2018 1551   GFRNONAA 113 11/22/2019 0000   GFRAA 131 11/22/2019 0000     CBC    Component Value Date/Time   WBC 11.9 (H) 12/06/2019 1426   WBC 9.6 05/21/2015 1010   RBC 5.13 12/06/2019 1426   RBC 4.41 05/21/2015 1010   HGB 11.5 12/06/2019 1426   HCT 36.6 12/06/2019 1426   PLT 452 (H) 12/06/2019 1426   MCV 71 (L) 12/06/2019  1426   MCH 22.4 (L) 12/06/2019 1426   MCH 25.4 (L) 05/21/2015 1010   MCHC 31.4 (L) 12/06/2019 1426   MCHC 31.5 05/21/2015 1010   RDW 15.4 12/06/2019 1426   LYMPHSABS 3.6 (H) 12/06/2019 1426   EOSABS 0.2 12/06/2019 1426   BASOSABS 0.0 12/06/2019 1426     Diabetic Labs (most recent): Lab Results  Component Value Date   HGBA1C 6.2 11/22/2019   HGBA1C 5.9 07/31/2017    Lipid Panel     Component Value Date/Time   CHOL 146 11/22/2019 0000   TRIG 156 11/22/2019 0000   HDL 41 11/22/2019 0000   LDLCALC 78 11/22/2019 0000     Thyroid uptake and scan on October 20, 20 FINDINGS: Homogeneous tracer accumulation within both thyroid lobes, diffusely increased.  No focal areas of increased or decreased tracer localization. 4 hour I-123 uptake = 57% (normal 5-20%) 24 hour I-123 uptake = 68% (normal 10-30%)  IMPRESSION: Diffusely increased homogeneous tracer accumulation in both thyroid lobes.  Increased 4 hour and 24 hour radio iodine uptakes consistent with hyperthyroidism. Findings consistent with Graves disease.  I-131 thyroid ablation on December 12, 2018  Her response was only partial. Repeat thyroid uptake and scan on January 16, 2020 FINDINGS: Homogeneous tracer distribution in both thyroid lobes. No focal areas of increased or decreased tracer localization.  4 hour I-123 uptake = 27.7% (normal  5-20%),  24 hour I-123 uptake = 43.7% (normal 10-30%)  IMPRESSION: Normal thyroid scan. Mildly elevated 4 hour and 24 hour radio iodine uptakes. Findings consistent with hyperthyroidism and Graves disease.  4 hour and 24 hour radio iodine uptakes have decreased since the previous exam.    Recent Results (from the past 2160 hour(s))  Basic metabolic panel     Status: None   Collection Time: 11/22/19 12:00 AM  Result Value Ref Range   BUN 14 4 - 21   Creatinine 0.6 0.5 - 1.1  Comprehensive metabolic panel     Status: None   Collection Time: 11/22/19 12:00 AM  Result Value Ref Range   GFR calc Af Amer 131    GFR calc non Af Amer 113    Calcium 9.4 8.7 - 10.7  Lipid panel     Status: None   Collection Time: 11/22/19 12:00 AM  Result Value Ref Range   Triglycerides 156 40 - 160   Cholesterol 146 0 - 200   HDL 41 35 - 70   LDL Cholesterol 78   Hemoglobin A1c     Status: None   Collection Time: 11/22/19 12:00 AM  Result Value Ref Range   Hemoglobin A1C 6.2   TSH     Status: Abnormal   Collection Time: 11/22/19 12:00 AM  Result Value Ref Range   TSH 0.01 (A) 0.41 - 5.90    Comment: T4- 15.7, T3- 31  T4, free     Status: Abnormal   Collection Time: 12/06/19  7:37 AM  Result Value Ref Range   Free T4 2.89 (H) 0.82 - 1.77 ng/dL  TSH     Status: Abnormal   Collection Time: 12/06/19  7:37 AM  Result Value Ref Range   TSH <0.005 (L) 0.450 - 4.500 uIU/mL  CBC with Differential     Status: Abnormal   Collection Time: 12/06/19  2:26 PM  Result Value Ref Range   WBC 11.9 (H) 3.4 - 10.8 x10E3/uL   RBC 5.13 3.77 - 5.28 x10E6/uL   Hemoglobin 11.5 11.1 - 15.9 g/dL  Hematocrit 36.6 34.0 - 46.6 %   MCV 71 (L) 79 - 97 fL   MCH 22.4 (L) 26.6 - 33.0 pg   MCHC 31.4 (L) 31.5 - 35.7 g/dL   RDW 15.4 11.7 - 15.4 %   Platelets 452 (H) 150 - 450 x10E3/uL   Neutrophils 61 Not Estab. %   Lymphs 30 Not Estab. %   Monocytes 7 Not Estab. %   Eos 2 Not Estab. %   Basos 0 Not Estab. %    Neutrophils Absolute 7.3 (H) 1.4 - 7.0 x10E3/uL   Lymphocytes Absolute 3.6 (H) 0.7 - 3.1 x10E3/uL   Monocytes Absolute 0.8 0.1 - 0.9 x10E3/uL   EOS (ABSOLUTE) 0.2 0.0 - 0.4 x10E3/uL   Basophils Absolute 0.0 0.0 - 0.2 x10E3/uL   Immature Granulocytes 0 Not Estab. %   Immature Grans (Abs) 0.0 0.0 - 0.1 x10E3/uL  Iron, TIBC and Ferritin Panel     Status: Abnormal   Collection Time: 12/06/19  2:26 PM  Result Value Ref Range   Total Iron Binding Capacity 270 250 - 450 ug/dL   UIBC 246 131 - 425 ug/dL   Iron 24 (L) 27 - 159 ug/dL   Iron Saturation 9 (LL) 15 - 55 %   Ferritin 48 15 - 150 ng/mL  Hepatic function panel     Status: None   Collection Time: 12/17/19 11:06 AM  Result Value Ref Range   Total Protein 6.8 6.1 - 8.1 g/dL   Albumin 3.9 3.6 - 5.1 g/dL   Globulin 2.9 1.9 - 3.7 g/dL (calc)   AG Ratio 1.3 1.0 - 2.5 (calc)   Total Bilirubin 0.3 0.2 - 1.2 mg/dL   Bilirubin, Direct 0.1 0.0 - 0.2 mg/dL   Indirect Bilirubin 0.2 0.2 - 1.2 mg/dL (calc)   Alkaline phosphatase (APISO) 76 31 - 125 U/L   AST 15 10 - 30 U/L   ALT 16 6 - 29 U/L  TSH     Status: Abnormal   Collection Time: 12/18/19  1:06 PM  Result Value Ref Range   TSH <0.005 (L) 0.450 - 4.500 uIU/mL  T4, free     Status: Abnormal   Collection Time: 12/18/19  1:06 PM  Result Value Ref Range   Free T4 2.15 (H) 0.82 - 1.77 ng/dL  T3, free     Status: Abnormal   Collection Time: 12/18/19  1:06 PM  Result Value Ref Range   T3, Free 7.3 (H) 2.0 - 4.4 pg/mL  TSH     Status: Abnormal   Collection Time: 01/16/20  2:22 PM  Result Value Ref Range   TSH <0.005 (L) 0.450 - 4.500 uIU/mL  T4, free     Status: Abnormal   Collection Time: 01/16/20  2:22 PM  Result Value Ref Range   Free T4 1.91 (H) 0.82 - 1.77 ng/dL      Assessment & Plan:   1.  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  Type 2 diabetes -See notes above.  I discussed her imaging studies, lab work, and next course of action.  She will be considered for  repeat I-131 thyroid ablation.  She understands the likelihood of hypothyroidism following this procedure.  I-131 thyroid ablation will be scheduled to be administered as soon as possible at [facility].  She will continue to benefit from low-dose prednisone 10 mg p.o. daily as well as propanolol 40 mg p.o. twice daily.  -Regarding her type 2 diabetes, her A1c is 6.1%.  She  is on Metformin 1000 mg p.o. twice daily.  She will have point-of-care A1c on subsequent visit.   - she acknowledges that there is a room for improvement in her food and drink choices. - Suggestion is made for her to avoid simple carbohydrates  from her diet including Cakes, Sweet Desserts, Ice Cream, Soda (diet and regular), Sweet Tea, Candies, Chips, Cookies, Store Bought Juices, Alcohol in Excess of  1-2 drinks a day, Artificial Sweeteners,  Coffee Creamer, and "Sugar-free" Products, Lemonade. This will help patient to have more stable blood glucose profile and potentially avoid unintended weight gain.    - I advised her to maintain close follow up with Andres Shad, MD for primary care needs.     - Time spent on this patient care encounter:  20 minutes of which 50% was spent in  counseling and the rest reviewing  her current and  previous labs / studies and medications  doses and developing a plan for long term care. Dajanae Brophy Muench  participated in the discussions, expressed understanding, and voiced agreement with the above plans.  All questions were answered to her satisfaction. she is encouraged to contact clinic should she have any questions or concerns prior to her return visit.   Follow up plan: Return in about 9 weeks (around 03/20/2020) for F/U with Labs after I131 Therapy.   Thank you for involving me in the care of this pleasant patient, and I will continue to update you with her progress.  Glade Lloyd, MD [facility] Phone: [phone]  Fax:  [phone]   01/17/2020, 12:32 PM  This note was partially dictated with voice recognition software. Similar sounding words can be transcribed inadequately or may not  be corrected upon review.

## 2020-02-06 ENCOUNTER — Ambulatory Visit (HOSPITAL_COMMUNITY): Payer: BC Managed Care – PPO

## 2020-02-13 ENCOUNTER — Encounter (HOSPITAL_COMMUNITY): Payer: BC Managed Care – PPO

## 2020-02-20 IMAGING — NM NM RAI THERAPY FOR HYPERTHYROIDISM
1 series · 1 of 1 positions shown · non-contrast
Comparison: Thyroid uptake and scan 11/27/2018

CLINICAL DATA: Hyperthyroidism, Graves disease

EXAM:
RADIOACTIVE IODINE THERAPY FOR HYPERTHYROIDISM
TECHNIQUE: Procedure, benefits and risks of radioactive iodine therapy for
Graves disease were discussed with the patient, as well as
alternatives.

[Series 1: bone statics · 2.07mm/px · 1 of 1 slices shown]
[im 1/1]
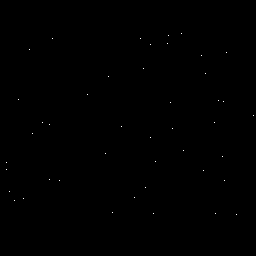

[1 of 1 positions shown; findings below may reference images not displayed]

Radiation safety issues and procedures were discussed, including
minimization of radiation risk to family and general public.

Written safety instructions were provided to the patient.

Patient's questions were answered.

Written informed consent for the therapy was obtained.

Timeout protocol followed.

Patient then received the radiopharmaceutical without immediate
complication.

The patient will follow-up with the referring physician.

RADIOPHARMACEUTICALS:  12.11 mCi Q-L5L sodium iodide orally
IMPRESSION: Per oral administration of Q-L5L sodium iodide for the treatment of
hyperthyroidism.

## 2020-02-21 ENCOUNTER — Other Ambulatory Visit: Payer: Self-pay

## 2020-02-21 ENCOUNTER — Encounter (HOSPITAL_COMMUNITY): Payer: Self-pay

## 2020-02-21 ENCOUNTER — Other Ambulatory Visit (HOSPITAL_COMMUNITY)
Admission: RE | Admit: 2020-02-21 | Discharge: 2020-02-21 | Disposition: A | Discharge status: Home / Self Care | Payer: BC Managed Care – PPO | Source: Ambulatory Visit | Attending: "Endocrinology | Admitting: "Endocrinology

## 2020-02-21 ENCOUNTER — Ambulatory Visit (HOSPITAL_COMMUNITY)
Admission: RE | Admit: 2020-02-21 | Discharge: 2020-02-21 | Disposition: A | Discharge status: Home / Self Care | Payer: BC Managed Care – PPO | Source: Ambulatory Visit | Attending: "Endocrinology | Admitting: "Endocrinology

## 2020-02-21 DIAGNOSIS — E0500 Thyrotoxicosis with diffuse goiter without thyrotoxic crisis or storm: Secondary | ICD-10-CM | POA: Diagnosis not present

## 2020-02-21 DIAGNOSIS — E0590 Thyrotoxicosis, unspecified without thyrotoxic crisis or storm: Secondary | ICD-10-CM | POA: Insufficient documentation

## 2020-02-21 LAB — HCG, SERUM, QUALITATIVE: Preg, Serum: NEGATIVE

## 2020-02-21 MED ORDER — SODIUM IODIDE I 131 CAPSULE
25.0000 | Freq: Once | INTRAVENOUS | Status: AC | PRN
  Administered 2020-02-21: 25.8 via ORAL

## 2020-03-05 ENCOUNTER — Other Ambulatory Visit: Payer: Self-pay | Admitting: "Endocrinology

## 2020-03-20 ENCOUNTER — Ambulatory Visit: Payer: BC Managed Care – PPO | Admitting: "Endocrinology

## 2020-04-02 ENCOUNTER — Telehealth: Payer: Self-pay

## 2020-04-02 NOTE — Telephone Encounter (Signed)
Melissa [facility] called office, pt is scheduled for CT abd/pelvis w/contrast 04/06/20. She needs PA for CT. Previous auth expired. [phone], ext [phone].  Pt was originally scheduled for CT 01/14/21.   PA for CT submitted via [email or web address] for [employer]. Case is pending. Tracking# [id number].

## 2020-04-03 NOTE — Telephone Encounter (Signed)
Received fax from [employer] requsting most recent results pertaining to CT A/P approved 12/19/19. Noted on fax, pt didn't complete CT that was approved. 12/19/19. She rescheduled CT for 04/06/20. Please extend previous authorization date or give new authorization. Faxed back to [facility].

## 2020-04-03 NOTE — Telephone Encounter (Signed)
PA pending

## 2020-04-06 ENCOUNTER — Ambulatory Visit (HOSPITAL_COMMUNITY): Payer: BC Managed Care – PPO

## 2020-04-06 NOTE — Telephone Encounter (Signed)
Can we please upload Leslie's note most recently if not already. Patient has already had [place] that was unrevealing and has persistent RUQ pain.

## 2020-04-06 NOTE — Telephone Encounter (Signed)
Per appt desk, CT appt for today cancelled and rescheduled to 04/27/20 d/t no auth.  CT has been denied: This case cannot be approved based on clinical information received. Please upload additional clinical documents if available. If your physician would like a peer to peer consult, call [phone].

## 2020-04-06 NOTE — Telephone Encounter (Signed)
Leslie's OV note 12/31/19 and [place] abd result for 12/17/19 were uploaded to case.

## 2020-04-08 ENCOUNTER — Telehealth: Payer: Self-pay | Admitting: Internal Medicine

## 2020-04-08 NOTE — Telephone Encounter (Signed)
Please call patient, she got news  from her insurance that her ct was denied.

## 2020-04-08 NOTE — Telephone Encounter (Signed)
See prior note. Message sent to [facility].

## 2020-04-15 LAB — LIPID PANEL
Cholesterol: 224 — AB (ref 0–200)
HDL: 45 (ref 35–70)
LDL Cholesterol: 144
Triglycerides: 192 — AB (ref 40–160)

## 2020-04-15 LAB — HEMOGLOBIN A1C: Hemoglobin A1C: 6.1

## 2020-04-15 LAB — BASIC METABOLIC PANEL
BUN: 12 (ref 4–21)
Creatinine: 0.9 (ref 0.5–1.1)

## 2020-04-15 LAB — TSH: TSH: 20.2 — AB (ref 0.41–5.90)

## 2020-04-15 LAB — COMPREHENSIVE METABOLIC PANEL
Calcium: 9.1 (ref 8.7–10.7)
GFR calc non Af Amer: 81

## 2020-04-20 ENCOUNTER — Telehealth: Payer: Self-pay | Admitting: Internal Medicine

## 2020-04-20 NOTE — Telephone Encounter (Signed)
Tried to call pt, no answer, LMOVM to inform her message had been sent to [facility] for peer to peer review.  Vicente Males, see previous phone note re: CT being denied. Peer to peer review needed: If your physician would like a peer to peer consult, call [phone]. She is scheduled for CT 04/27/20.

## 2020-04-20 NOTE — Telephone Encounter (Signed)
Please call patient regarding her scheduled CT for 3/21.    [phone]

## 2020-04-21 NOTE — Telephone Encounter (Signed)
AB advised OV needed since she was last seen 12/2019.  Cancelled CT appt for 04/27/20 d/t insurance denied test.  Called and informed pt CT appt was cancelled d/t insurance denied. OV scheduled for 06/02/20.

## 2020-04-21 NOTE — Telephone Encounter (Signed)
Tretha Sciara, they said they never received clinicals that were faxed by [place]. Not sure why as I know they were faxed from here. Do we have a way to ensure it is received by them in the future?  They have completely denied this as she did not complete the CT originally approved in Nov 2021.   I spoke with Dr. Carollee Herter. He recommended to withdraw the last 2 CT requests and apply again as they had already approved the Nov 2021 and won't reopen this.   He has reviewed and made notes that she has chronic RUQ pain, celiac disease, unrevealing labs. EGD last in March 2019. [place] on file from Nov 2021.

## 2020-04-25 LAB — TSH: TSH: 41.7 u[IU]/mL — ABNORMAL HIGH (ref 0.450–4.500)

## 2020-04-25 LAB — T4, FREE: Free T4: 0.46 ng/dL — ABNORMAL LOW (ref 0.82–1.77)

## 2020-04-25 LAB — T3, FREE: T3, Free: 1.2 pg/mL — ABNORMAL LOW (ref 2.0–4.4)

## 2020-04-27 ENCOUNTER — Ambulatory Visit (HOSPITAL_COMMUNITY): Payer: BC Managed Care – PPO

## 2020-04-27 ENCOUNTER — Encounter (HOSPITAL_COMMUNITY): Payer: Self-pay

## 2020-04-27 ENCOUNTER — Other Ambulatory Visit: Payer: Self-pay

## 2020-04-27 ENCOUNTER — Encounter: Payer: Self-pay | Admitting: "Endocrinology

## 2020-04-27 ENCOUNTER — Ambulatory Visit (INDEPENDENT_AMBULATORY_CARE_PROVIDER_SITE_OTHER): Payer: BC Managed Care – PPO | Admitting: "Endocrinology

## 2020-04-27 VITALS — BP 120/81 | HR 96 | Ht 63.0 in | Wt 308.6 lb

## 2020-04-27 DIAGNOSIS — E890 Postprocedural hypothyroidism: Secondary | ICD-10-CM | POA: Diagnosis not present

## 2020-04-27 DIAGNOSIS — E0500 Thyrotoxicosis with diffuse goiter without thyrotoxic crisis or storm: Secondary | ICD-10-CM

## 2020-04-27 MED ORDER — LEVOTHYROXINE SODIUM 100 MCG PO TABS: 100.0000 ug | ORAL_TABLET | Freq: Every day | ORAL | 1 refills | Status: DC

## 2020-04-27 NOTE — Progress Notes (Signed)
04/27/2020           Endocrinology follow-up note  Subjective:    Patient ID: Melissa Schmidt, female    DOB: 1975-10-01, PCP Andres Shad, MD.   Past Medical History:  Diagnosis Date  . Arthritis   . Borderline diabetes   . Celiac disease   . Complication of anesthesia    shaking post anesthesia  . Diabetes mellitus without complication ([place])   . Hemorrhoids   . Hypertriglyceridemia   . PCOS (polycystic ovarian syndrome)    Past Surgical History:  Procedure Laterality Date  . APPENDECTOMY  2002  . COLONOSCOPY WITH PROPOFOL N/A 05/25/2015   Dr.Rourk- non-bleeding hemorrhoids, the examination was o/w normal  . ESOPHAGEAL DILATION N/A 05/25/2015   Procedure: ESOPHAGEAL DILATION;  Surgeon: Daneil Dolin, MD;  [facility];  Service: Endoscopy;  Laterality: N/A;  . ESOPHAGOGASTRODUODENOSCOPY  04/2017   [place]: EGD with irregular Z-line s/p biopsy, benign-appearing esophageal stenosis s/p dilation, normal examined duodenum but no biopsies of duodenum. Pathology with junctional type GE mucosa with chronic inflammation and surface erosion, no metaplasia, negative dysplasia.   Marland Kitchen ESOPHAGOGASTRODUODENOSCOPY (EGD) WITH PROPOFOL N/A 05/25/2015   Dr.Rourk- normal esophagus, dilated, gastric erosions, intact fundoplication, normal third portion of the duodenum, non-bleeding erosive gastropathy. duodenum bx= benign small bowel mucosa with patchy increased intraepithelial lymphocytes, mild villous blunting and mild crypt hyperplasia, no dysplasia or malignancy stomach bx= erosive gastritis with reactive changes  . NISSEN FUNDOPLICATION  2979   Social History   Socioeconomic History  . Marital status: Married    Spouse name: Not on file  . Number of children: 1  . Years of education: Not on file  . Highest education level: Not on file  Occupational History  . Occupation: in school  Tobacco Use  . Smoking status: Never Smoker  . Smokeless tobacco: Never Used  Vaping Use   . Vaping Use: Never used  Substance and Sexual Activity  . Alcohol use: No    Alcohol/week: 0.0 standard drinks  . Drug use: No  . Sexual activity: Yes    Birth control/protection: None  Other Topics Concern  . Not on file  Social History Narrative  . Not on file   Social Determinants of Health   Financial Resource Strain: Not on file  Food Insecurity: Not on file  Transportation Needs: Not on file  Physical Activity: Not on file  Stress: Not on file  Social Connections: Not on file   Outpatient Encounter Medications as of 04/27/2020  Medication Sig  . levothyroxine (SYNTHROID) 100 MCG tablet Take 1 tablet (100 mcg total) by mouth daily before breakfast.  . Ascorbic Acid (VITAMIN C ADULT GUMMIES PO) Take by mouth daily.  Marland Kitchen azelastine (ASTELIN) 0.1 % nasal spray Place 1 spray into both nostrils 2 (two) times daily. Use in each nostril as directed  . BREO ELLIPTA 200-25 MCG/INH AEPB Inhale 1 puff into the lungs daily.  . ferrous sulfate 325 (65 FE) MG EC tablet Take 325 mg by mouth daily with breakfast.  . fluticasone (FLONASE) 50 MCG/ACT nasal spray Place 2 sprays into both nostrils as needed.   Marland Kitchen glimepiride (AMARYL) 1 MG tablet Take 1 mg by mouth daily.  . hydrocortisone (ANUSOL-HC) 2.5 % rectal cream Place 1 application rectally 2 (two) times daily. (Patient taking differently: Place 1 application rectally as needed. )  . lisinopril (ZESTRIL) 10 MG tablet Take 10 mg by mouth daily.  . metFORMIN (GLUCOPHAGE) 1000 MG tablet  Take 1,000 mg by mouth 2 (two) times daily with a meal.   . methocarbamol (ROBAXIN) 500 MG tablet Take 1 tablet (500 mg total) by mouth 2 (two) times daily as needed.  . Multiple Vitamin (MULTIVITAMIN) capsule Take 1 capsule by mouth daily.  . pantoprazole (PROTONIX) 40 MG tablet Take 1 tablet (40 mg total) by mouth daily before breakfast.  . predniSONE (DELTASONE) 10 MG tablet Take 1 tablet (10 mg total) by mouth daily with breakfast.  . propranolol  (INDERAL) 40 MG tablet Take 1 tablet by mouth twice daily  . VITAMIN D PO Take 2,000 mg by mouth daily.   No facility-administered encounter medications on file as of 04/27/2020.    ALLERGIES: Allergies  Allergen Reactions  . Singulair [Montelukast]     VACCINATION STATUS:  There is no immunization history on file for this patient.   HPI  Melissa Schmidt is 45 y.o. 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  First treatment was administered in November 2020, with inadequate response.  She was treated again in January 2022.  She presents with repeat thyroid function test consistent with treatment response with hypothyroidism. -Prior to treatment,  her thyroid uptake showed 68% in 24 hours  suggesting Graves' disease. -She presents with symptoms including fatigue, cold intolerance, constipation.  Her previous symptoms of palpitations, tremors, heat intolerance have resolved.   -She has gained 12 pounds since last visit.  she denies family history of thyroid dysfunction nor any thyroid cancer.                            Review of systems  Limited as above  Objective:    BP 120/81   Pulse 96   Ht 5' 3"  (1.6 m)   Wt (!) 308 lb 9.6 oz (140 kg)   BMI 54.67 kg/m   Wt Readings from Last 3 Encounters:  04/27/20 (!) 308 lb 9.6 oz (140 kg)  01/17/20 296 lb (134.3 kg)  12/31/19 295 lb 9.6 oz (134.1 kg)                   Component Value Date/Time   NA 140 11/05/2018 1551   K 4.6 11/05/2018 1551   CL 103 11/05/2018 1551   CO2 25 11/05/2018 1551   GLUCOSE 55 (L) 11/05/2018 1551   GLUCOSE 163 (H) 05/21/2015 1010   BUN 12 04/15/2020 0000   CREATININE 0.9 04/15/2020 0000   CREATININE 0.71 11/05/2018 1551   CALCIUM 9.1 04/15/2020 0000   PROT 6.8 12/17/2019 1106   PROT 6.8 11/05/2018 1551   ALBUMIN 4.2 11/05/2018 1551   AST 15 12/17/2019 1106   ALT 16 12/17/2019 1106   ALKPHOS 89 11/05/2018 1551   BILITOT 0.3  12/17/2019 1106   BILITOT <0.2 11/05/2018 1551   GFRNONAA 81 04/15/2020 0000   GFRAA 131 11/22/2019 0000     CBC    Component Value Date/Time   WBC 11.9 (H) 12/06/2019 1426   WBC 9.6 05/21/2015 1010   RBC 5.13 12/06/2019 1426   RBC 4.41 05/21/2015 1010   HGB 11.5 12/06/2019 1426   HCT 36.6 12/06/2019 1426   PLT 452 (H) 12/06/2019 1426   MCV 71 (L) 12/06/2019 1426   MCH 22.4 (L) 12/06/2019 1426   MCH 25.4 (L) 05/21/2015 1010   MCHC 31.4 (L) 12/06/2019 1426   MCHC 31.5 05/21/2015 1010  RDW 15.4 12/06/2019 1426   LYMPHSABS 3.6 (H) 12/06/2019 1426   EOSABS 0.2 12/06/2019 1426   BASOSABS 0.0 12/06/2019 1426     Diabetic Labs (most recent): Lab Results  Component Value Date   HGBA1C 6.1 04/15/2020   HGBA1C 6.2 11/22/2019   HGBA1C 5.9 07/31/2017    Lipid Panel     Component Value Date/Time   CHOL 224 (A) 04/15/2020 0000   TRIG 192 (A) 04/15/2020 0000   HDL 45 04/15/2020 0000   LDLCALC 144 04/15/2020 0000     Thyroid uptake and scan on October 20, 20 FINDINGS: Homogeneous tracer accumulation within both thyroid lobes, diffusely increased.  No focal areas of increased or decreased tracer localization. 4 hour I-123 uptake = 57% (normal 5-20%) 24 hour I-123 uptake = 68% (normal 10-30%)  IMPRESSION: Diffusely increased homogeneous tracer accumulation in both thyroid lobes.  Increased 4 hour and 24 hour radio iodine uptakes consistent with hyperthyroidism. Findings consistent with Graves disease.  I-131 thyroid ablation on December 12, 2018  Her response was only partial. Repeat thyroid uptake and scan on January 16, 2020 FINDINGS: Homogeneous tracer distribution in both thyroid lobes. No focal areas of increased or decreased tracer localization.  4 hour I-123 uptake = 27.7% (normal 5-20%),  24 hour I-123 uptake = 43.7% (normal 10-30%)  IMPRESSION: Normal thyroid scan. Mildly elevated 4 hour and 24 hour radio iodine uptakes. Findings  consistent with hyperthyroidism and Graves disease.  4 hour and 24 hour radio iodine uptakes have decreased since the previous exam.    Recent Results (from the past 2160 hour(s))  hCG, serum, qualitative     Status: None   Collection Time: 02/21/20  8:17 AM  Result Value Ref Range   Preg, Serum NEGATIVE NEGATIVE    Comment:        THE SENSITIVITY OF THIS METHODOLOGY IS >10 mIU/mL. Performed at [facility], [address], [place], [place] [address]   Basic metabolic panel     Status: None   Collection Time: 04/15/20 12:00 AM  Result Value Ref Range   BUN 12 4 - 21   Creatinine 0.9 0.5 - 1.1  Comprehensive metabolic panel     Status: None   Collection Time: 04/15/20 12:00 AM  Result Value Ref Range   GFR calc non Af Amer 81    Calcium 9.1 8.7 - 10.7  Lipid panel     Status: Abnormal   Collection Time: 04/15/20 12:00 AM  Result Value Ref Range   Triglycerides 192 (A) 40 - 160   Cholesterol 224 (A) 0 - 200   HDL 45 35 - 70   LDL Cholesterol 144   Hemoglobin A1c     Status: None   Collection Time: 04/15/20 12:00 AM  Result Value Ref Range   Hemoglobin A1C 6.1   TSH     Status: Abnormal   Collection Time: 04/15/20 12:00 AM  Result Value Ref Range   TSH 20.20 (A) 0.41 - 5.90    Comment: T3 UPTAKE- 17, FREE THYROXINE INDEX- 0.9  TSH     Status: Abnormal   Collection Time: 04/24/20  3:45 PM  Result Value Ref Range   TSH 41.700 (H) 0.450 - 4.500 uIU/mL  T4, free     Status: Abnormal   Collection Time: 04/24/20  3:45 PM  Result Value Ref Range   Free T4 0.46 (L) 0.82 - 1.77 ng/dL  T3, free     Status: Abnormal   Collection Time: 04/24/20  3:45 PM  Result Value Ref Range   T3, Free 1.2 (L) 2.0 - 4.4 pg/mL   On February 21, 2020 she received second dose of RAI thyroid ablative therapy: Patient then received the radiopharmaceutical without immediate complication.  RADIOPHARMACEUTICALS:  25.8 mCi I-131 sodium iodide orally  IMPRESSION: Per oral administration of  I-131 sodium iodide for the treatment of recurrent hyperthyroidism.  Assessment & Plan:   1.  RAI induced hypothyroidism  2. Graves' disease 2.  Type 2 diabetes  -I discussed her labs and treatments administered for hyperthyroidism so far. -She  has responded to the second treatment with RAI with clinical and biochemical hypothyroidism.  She will need to be initiated on thyroid hormone replacement.  I discussed and prescribe levothyroxine 100 mcg p.o. daily before breakfast.    - We discussed about the correct intake of her thyroid hormone, on empty stomach at fasting, with water, separated by at least 30 minutes from breakfast and other medications,  and separated by more than 4 hours from calcium, iron, multivitamins, acid reflux medications (PPIs). -Patient is made aware of the fact that thyroid hormone replacement is needed for life, dose to be adjusted by periodic monitoring of thyroid function tests. She is advised to finish and stop propanolol.  -Regarding her type 2 diabetes, her A1c is 6.1%.  She is on Metformin 1000 mg p.o. twice daily.  She will have point-of-care A1c on subsequent visit.   - she acknowledges that there is a room for improvement in her food and drink choices. - Suggestion is made for her to avoid simple carbohydrates  from her diet including Cakes, Sweet Desserts, Ice Cream, Soda (diet and regular), Sweet Tea, Candies, Chips, Cookies, Store Bought Juices, Alcohol in Excess of  1-2 drinks a day, Artificial Sweeteners,  Coffee Creamer, and "Sugar-free" Products, Lemonade. This will help patient to have more stable blood glucose profile and potentially avoid unintended weight gain.   - I advised her to maintain close follow up with Andres Shad, MD for primary care needs.      - Time spent on this patient care encounter:  30 minutes of which 50% was spent in  counseling and the rest reviewing  her current and  previous labs / studies and medications  doses  and developing a plan for long term care, and documenting this care. Melissa Schmidt  participated in the discussions, expressed understanding, and voiced agreement with the above plans.  All questions were answered to her satisfaction. she is encouraged to contact clinic should she have any questions or concerns prior to her return visit.    Follow up plan: Return in about 8 weeks (around 06/22/2020) for F/U with Pre-visit Labs.   Thank you for involving me in the care of this pleasant patient, and I will continue to update you with her progress.  Glade Lloyd, MD [facility] [facility] Phone: [phone]  Fax: [phone]   04/27/2020, 2:26 PM  This note was partially dictated with voice recognition software. Similar sounding words can be transcribed inadequately or may not  be corrected upon review.

## 2020-06-02 ENCOUNTER — Ambulatory Visit: Payer: BC Managed Care – PPO | Admitting: Gastroenterology

## 2020-06-12 ENCOUNTER — Ambulatory Visit: Payer: Self-pay

## 2020-06-12 ENCOUNTER — Ambulatory Visit (INDEPENDENT_AMBULATORY_CARE_PROVIDER_SITE_OTHER): Payer: BC Managed Care – PPO | Admitting: Orthopaedic Surgery

## 2020-06-12 ENCOUNTER — Encounter: Payer: Self-pay | Admitting: Orthopaedic Surgery

## 2020-06-12 VITALS — Ht 63.0 in | Wt 311.0 lb

## 2020-06-12 DIAGNOSIS — M545 Low back pain, unspecified: Secondary | ICD-10-CM

## 2020-06-12 DIAGNOSIS — M5459 Other low back pain: Secondary | ICD-10-CM | POA: Diagnosis not present

## 2020-06-12 DIAGNOSIS — Z6841 Body Mass Index (BMI) 40.0 and over, adult: Secondary | ICD-10-CM | POA: Diagnosis not present

## 2020-06-12 DIAGNOSIS — G8929 Other chronic pain: Secondary | ICD-10-CM

## 2020-06-12 DIAGNOSIS — M25561 Pain in right knee: Secondary | ICD-10-CM

## 2020-06-12 MED ORDER — LIDOCAINE HCL 1 % IJ SOLN
2.0000 mL | INTRAMUSCULAR | Status: AC | PRN
  Administered 2020-06-12: 2 mL

## 2020-06-12 MED ORDER — BUPIVACAINE HCL 0.5 % IJ SOLN
2.0000 mL | INTRAMUSCULAR | Status: AC | PRN
  Administered 2020-06-12: 2 mL via INTRA_ARTICULAR

## 2020-06-12 MED ORDER — METHYLPREDNISOLONE ACETATE 40 MG/ML IJ SUSP
40.0000 mg | INTRAMUSCULAR | Status: AC | PRN
  Administered 2020-06-12: 40 mg via INTRA_ARTICULAR

## 2020-06-12 NOTE — Progress Notes (Signed)
Office Visit Note   Patient: Melissa Schmidt           Date of Birth: [birth year]           MRN: [id number] Visit Date: 06/12/2020              Requested by: Andres Shad, MD [address] [place] [address] PCP: Andres Shad, MD   Assessment & Plan: Visit Diagnoses:  1. Chronic pain of right knee   2. Chronic right-sided low back pain, unspecified whether sciatica present   3. Body mass index 50.0-59.9, adult ([place])   4. Morbid obesity ([place])     Plan: In regards to the right knee I feel that either exacerbation of OA or degenerative medial meniscal tear.  We talked about the importance of strengthening and weight loss.  She is also agreeable to a cortisone injection which she tolerated well today.  She will follow-up in about 6 weeks if she does not notice any improvement.  For the lumbar spine she is already enrolled in physical therapy which agree with work on weight loss which will help with the back pain.  Follow-Up Instructions: Return if symptoms worsen or fail to improve.   Orders:  Orders Placed This Encounter  Procedures  . XR KNEE 3 VIEW RIGHT  . XR Lumbar Spine 2-3 Views   No orders of the defined types were placed in this encounter.     Procedures: Large Joint Inj: R knee on 06/12/2020 12:21 PM Indications: pain Details: 22 G needle  Arthrogram: No  Medications: 40 mg methylPREDNISolone acetate 40 MG/ML; 2 mL lidocaine 1 %; 2 mL bupivacaine 0.5 % Consent was given by the patient. Patient was prepped and draped in the usual sterile fashion.       Clinical Data: No additional findings.   Subjective: Chief Complaint  Patient presents with  . Right Knee - Pain  . Lower Back - Pain    Melissa Schmidt is a 45 year old female comes in for evaluation of 2 months of right knee pain and 6 weeks of back pain without radiation.  She had a fall Monday which made both areas feel worse.  She endorses anterior knee pain with some radiation to the medial  side.  She has mainly right-sided low back pain without any numbness or tingling or radicular symptoms.  Sitting is better for the back pain and walking is worse.  She takes ibuprofen occasionally.  She is currently doing physical therapy and aquatic therapy in [place].  She is a Agricultural engineer.  Denies any mechanical symptoms with the right knee.  Denies any swelling.   Review of Systems  Constitutional: Negative.   HENT: Negative.   Eyes: Negative.   Respiratory: Negative.   Cardiovascular: Negative.   Endocrine: Negative.   Musculoskeletal: Negative.   Neurological: Negative.   Hematological: Negative.   Psychiatric/Behavioral: Negative.   All other systems reviewed and are negative.    Objective: Vital Signs: Ht 5' 3"  (1.6 m)   Wt (!) 311 lb (141.1 kg)   BMI 55.09 kg/m   Physical Exam Vitals and nursing note reviewed.  Constitutional:      Appearance: She is well-developed.  HENT:     Head: Normocephalic and atraumatic.  Pulmonary:     Effort: Pulmonary effort is normal.  Abdominal:     Palpations: Abdomen is soft.  Musculoskeletal:     Cervical back: Neck supple.  Skin:    General: Skin is warm.  Capillary Refill: Capillary refill takes less than 2 seconds.  Neurological:     Mental Status: She is alert and oriented to person, place, and time.  Psychiatric:        Behavior: Behavior normal.        Thought Content: Thought content normal.        Judgment: Judgment normal.     Ortho Exam Right knee shows a slight tenderness of the medial joint line.  Negative [place]ray.  Normal range of motion.  No joint effusion.  Collaterals and cruciates are stable.  Strength and sensation intact.  Low back shows tenderness to the right paraspinous muscles.  No sciatic tension signs.  Hip exam is unremarkable. Specialty Comments:  No specialty comments available.  Imaging: XR KNEE 3 VIEW RIGHT  Result Date: 06/12/2020 Mild osteoarthritis with periarticular spurring.  XR  Lumbar Spine 2-3 Views  Result Date: 06/12/2020 Lumbar spondylosis without any acute abnormalities.    PMFS History: Patient Active Problem List   Diagnosis Date Noted  . Iron deficiency anemia 12/31/2019  . 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  . Hemorrhoids 12/11/2018  . Graves' disease 11/27/2018  . RUQ abdominal pain 10/10/2018  . Celiac disease 10/10/2018  . GERD (gastroesophageal reflux disease) 10/10/2018  . Hyperthyroidism 10/10/2017  . Uncontrolled type 2 diabetes mellitus with hyperglycemia ([place]) 10/10/2017  . Mucosal abnormality of stomach   . Dysphagia   . Positive autoantibody screening for celiac disease 04/29/2015  . Fatty liver 03/19/2015  . [place] RUQ abdominal pain 03/19/2015  . RLQ abdominal pain 03/19/2015  . Rectal bleeding 03/19/2015   Past Medical History:  Diagnosis Date  . Arthritis   . Borderline diabetes   . Celiac disease   . Complication of anesthesia    shaking post anesthesia  . Diabetes mellitus without complication ([place])   . Hemorrhoids   . Hypertriglyceridemia   . PCOS (polycystic ovarian syndrome)     Family History  Problem Relation Age of Onset  . Irritable bowel syndrome Mother   . Inflammatory bowel disease Neg Hx   . Colon cancer Neg Hx   . Liver disease Neg Hx     Past Surgical History:  Procedure Laterality Date  . APPENDECTOMY  2002  . COLONOSCOPY WITH PROPOFOL N/A 05/25/2015   Dr.Rourk- non-bleeding hemorrhoids, the examination was o/w normal  . ESOPHAGEAL DILATION N/A 05/25/2015   Procedure: ESOPHAGEAL DILATION;  Surgeon: Daneil Dolin, MD;  [facility];  Service: Endoscopy;  Laterality: N/A;  . ESOPHAGOGASTRODUODENOSCOPY  04/2017   [place]: EGD with irregular Z-line s/p biopsy, benign-appearing esophageal stenosis s/p dilation, normal examined duodenum but no biopsies of duodenum. Pathology with junctional type GE mucosa with chronic inflammation and surface erosion, no metaplasia,  negative dysplasia.   Marland Kitchen ESOPHAGOGASTRODUODENOSCOPY (EGD) WITH PROPOFOL N/A 05/25/2015   Dr.Rourk- normal esophagus, dilated, gastric erosions, intact fundoplication, normal third portion of the duodenum, non-bleeding erosive gastropathy. duodenum bx= benign small bowel mucosa with patchy increased intraepithelial lymphocytes, mild villous blunting and mild crypt hyperplasia, no dysplasia or malignancy stomach bx= erosive gastritis with reactive changes  . NISSEN FUNDOPLICATION  5462   Social History   Occupational History  . Occupation: in school  Tobacco Use  . Smoking status: Never Smoker  . Smokeless tobacco: Never Used  Vaping Use  . Vaping Use: Never used  Substance and Sexual Activity  . Alcohol use: No    Alcohol/week: 0.0 standard drinks  . Drug use: No  . Sexual activity: Yes  Birth control/protection: None

## 2020-07-01 ENCOUNTER — Ambulatory Visit: Payer: BC Managed Care – PPO | Admitting: "Endocrinology

## 2020-07-07 ENCOUNTER — Ambulatory Visit: Payer: BC Managed Care – PPO | Admitting: "Endocrinology

## 2020-07-09 LAB — T4, FREE: Free T4: 1.12 ng/dL (ref 0.82–1.77)

## 2020-07-09 LAB — TSH: TSH: 32.5 u[IU]/mL — ABNORMAL HIGH (ref 0.450–4.500)

## 2020-07-20 ENCOUNTER — Other Ambulatory Visit: Payer: Self-pay

## 2020-07-21 ENCOUNTER — Encounter: Payer: Self-pay | Admitting: "Endocrinology

## 2020-07-21 ENCOUNTER — Ambulatory Visit (INDEPENDENT_AMBULATORY_CARE_PROVIDER_SITE_OTHER): Payer: BC Managed Care – PPO | Admitting: "Endocrinology

## 2020-07-21 ENCOUNTER — Other Ambulatory Visit: Payer: Self-pay

## 2020-07-21 VITALS — BP 127/84 | HR 85 | Ht 63.0 in | Wt 316.8 lb

## 2020-07-21 DIAGNOSIS — E890 Postprocedural hypothyroidism: Secondary | ICD-10-CM

## 2020-07-21 DIAGNOSIS — E119 Type 2 diabetes mellitus without complications: Secondary | ICD-10-CM | POA: Diagnosis not present

## 2020-07-21 MED ORDER — LEVOTHYROXINE SODIUM 150 MCG PO TABS: 150.0000 ug | ORAL_TABLET | Freq: Every day | ORAL | 1 refills | Status: DC

## 2020-07-21 NOTE — Progress Notes (Signed)
07/21/2020           Endocrinology follow-up note  Subjective:    Patient ID: Melissa Schmidt, female    DOB: 06-May-1975, PCP Andres Shad, MD.   Past Medical History:  Diagnosis Date   Arthritis    Borderline diabetes    Celiac disease    Complication of anesthesia    shaking post anesthesia   Diabetes mellitus without complication ([place])    Hemorrhoids    Hypertriglyceridemia    PCOS (polycystic ovarian syndrome)    Past Surgical History:  Procedure Laterality Date   APPENDECTOMY  2002   COLONOSCOPY WITH PROPOFOL N/A 05/25/2015   Dr.Rourk- non-bleeding hemorrhoids, the examination was o/w normal   ESOPHAGEAL DILATION N/A 05/25/2015   Procedure: ESOPHAGEAL DILATION;  Surgeon: Daneil Dolin, MD;  [facility];  Service: Endoscopy;  Laterality: N/A;   ESOPHAGOGASTRODUODENOSCOPY  04/2017   [place]: EGD with irregular Z-line s/p biopsy, benign-appearing esophageal stenosis s/p dilation, normal examined duodenum but no biopsies of duodenum. Pathology with junctional type GE mucosa with chronic inflammation and surface erosion, no metaplasia, negative dysplasia.    ESOPHAGOGASTRODUODENOSCOPY (EGD) WITH PROPOFOL N/A 05/25/2015   Dr.Rourk- normal esophagus, dilated, gastric erosions, intact fundoplication, normal third portion of the duodenum, non-bleeding erosive gastropathy. duodenum bx= benign small bowel mucosa with patchy increased intraepithelial lymphocytes, mild villous blunting and mild crypt hyperplasia, no dysplasia or malignancy stomach bx= erosive gastritis with reactive changes   NISSEN FUNDOPLICATION  [id number]   Social History   Socioeconomic History   Marital status: Married    Spouse name: Not on file   Number of children: 1   Years of education: Not on file   Highest education level: Not on file  Occupational History   Occupation: in school  Tobacco Use   Smoking status: Never   Smokeless tobacco: Never  Vaping Use   Vaping Use: Never used   Substance and Sexual Activity   Alcohol use: No    Alcohol/week: 0.0 standard drinks   Drug use: No   Sexual activity: Yes    Birth control/protection: None  Other Topics Concern   Not on file  Social History Narrative   Not on file   Social Determinants of Health   Financial Resource Strain: Not on file  Food Insecurity: Not on file  Transportation Needs: Not on file  Physical Activity: Not on file  Stress: Not on file  Social Connections: Not on file   Outpatient Encounter Medications as of 07/21/2020  Medication Sig   Ascorbic Acid (VITAMIN C ADULT GUMMIES PO) Take by mouth daily.   azelastine (ASTELIN) 0.1 % nasal spray Place 1 spray into both nostrils 2 (two) times daily. Use in each nostril as directed   BREO ELLIPTA 200-25 MCG/INH AEPB Inhale 1 puff into the lungs daily.   ferrous sulfate 325 (65 FE) MG EC tablet Take 325 mg by mouth daily with breakfast.   fluticasone (FLONASE) 50 MCG/ACT nasal spray Place 2 sprays into both nostrils as needed.    glimepiride (AMARYL) 1 MG tablet Take 1 mg by mouth daily.   hydrocortisone (ANUSOL-HC) 2.5 % rectal cream Place 1 application rectally 2 (two) times daily. (Patient taking differently: Place 1 application rectally as needed.)   levothyroxine (SYNTHROID) 150 MCG tablet Take 1 tablet (150 mcg total) by mouth daily before breakfast.   metFORMIN (GLUCOPHAGE) 1000 MG tablet Take 1,000 mg by mouth 2 (two) times daily with a meal.    methocarbamol (  ROBAXIN) 500 MG tablet Take 1 tablet (500 mg total) by mouth 2 (two) times daily as needed.   Multiple Vitamin (MULTIVITAMIN) capsule Take 1 capsule by mouth daily.   pantoprazole (PROTONIX) 40 MG tablet Take 1 tablet (40 mg total) by mouth daily before breakfast.   VITAMIN D PO Take 2,000 mg by mouth daily.   [DISCONTINUED] levothyroxine (SYNTHROID) 100 MCG tablet Take 1 tablet (100 mcg total) by mouth daily before breakfast.   No facility-administered encounter medications on file as of  07/21/2020.    ALLERGIES: Allergies  Allergen Reactions   Singulair [Montelukast]     VACCINATION STATUS:  There is no immunization history on file for this patient.   HPI  Melissa Schmidt is 45 y.o. 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  First treatment was administered in November 2020, with inadequate response.  She was treated again in January 2022.  She was initiated on levothyroxine for RAI induced hypothyroidism during her last visit.   -Prior to treatment,  her thyroid uptake showed 68% in 24 hours  suggesting Graves' disease. Her previsit thyroid function tests are consistent with inadequate replacement. -She presents with symptoms including fatigue, progressive weight gain, cold intolerance, constipation.  Her previous symptoms of palpitations, tremors, heat intolerance have resolved.  she denies family history of thyroid dysfunction nor any thyroid cancer.                            Review of systems  Limited as above  Objective:    BP 127/84   Pulse 85   Ht 5' 3"  (1.6 m)   Wt (!) 316 lb 12.8 oz (143.7 kg)   BMI 56.12 kg/m   Wt Readings from Last 3 Encounters:  07/21/20 (!) 316 lb 12.8 oz (143.7 kg)  06/12/20 (!) 311 lb (141.1 kg)  04/27/20 (!) 308 lb 9.6 oz (140 kg)                   Component Value Date/Time   NA 140 11/05/2018 1551   K 4.6 11/05/2018 1551   CL 103 11/05/2018 1551   CO2 25 11/05/2018 1551   GLUCOSE 55 (L) 11/05/2018 1551   GLUCOSE 163 (H) 05/21/2015 1010   BUN 12 04/15/2020 0000   CREATININE 0.9 04/15/2020 0000   CREATININE 0.71 11/05/2018 1551   CALCIUM 9.1 04/15/2020 0000   PROT 6.8 12/17/2019 1106   PROT 6.8 11/05/2018 1551   ALBUMIN 4.2 11/05/2018 1551   AST 15 12/17/2019 1106   ALT 16 12/17/2019 1106   ALKPHOS 89 11/05/2018 1551   BILITOT 0.3 12/17/2019 1106   BILITOT <0.2 11/05/2018 1551   GFRNONAA 81 04/15/2020 0000   GFRAA 131 11/22/2019 0000      CBC    Component Value Date/Time   WBC 11.9 (H) 12/06/2019 1426   WBC 9.6 05/21/2015 1010   RBC 5.13 12/06/2019 1426   RBC 4.41 05/21/2015 1010   HGB 11.5 12/06/2019 1426   HCT 36.6 12/06/2019 1426   PLT 452 (H) 12/06/2019 1426   MCV 71 (L) 12/06/2019 1426   MCH 22.4 (L) 12/06/2019 1426   MCH 25.4 (L) 05/21/2015 1010   MCHC 31.4 (L) 12/06/2019 1426   MCHC 31.5 05/21/2015 1010   RDW 15.4 12/06/2019 1426   LYMPHSABS 3.6 (H) 12/06/2019 1426   EOSABS 0.2 12/06/2019 1426   BASOSABS 0.0 12/06/2019  1426     Diabetic Labs (most recent): Lab Results  Component Value Date   HGBA1C 6.1 04/15/2020   HGBA1C 6.2 11/22/2019   HGBA1C 5.9 07/31/2017    Lipid Panel     Component Value Date/Time   CHOL 224 (A) 04/15/2020 0000   TRIG 192 (A) 04/15/2020 0000   HDL 45 04/15/2020 0000   LDLCALC 144 04/15/2020 0000     Thyroid uptake and scan on October 20, 20 FINDINGS: Homogeneous tracer accumulation within both thyroid lobes, diffusely increased.   No focal areas of increased or decreased tracer localization.  4 hour I-123 uptake = 57% (normal 5-20%)  24 hour I-123 uptake = 68% (normal 10-30%)   IMPRESSION: Diffusely increased homogeneous tracer accumulation in both thyroid lobes.   Increased 4 hour and 24 hour radio iodine uptakes consistent with hyperthyroidism.  Findings consistent with Graves disease.  I-131 thyroid ablation on December 12, 2018  Her response was only partial. Repeat thyroid uptake and scan on January 16, 2020 FINDINGS: Homogeneous tracer distribution in both thyroid lobes.  No focal areas of increased or decreased tracer localization.   4 hour I-123 uptake = 27.7% (normal 5-20%),  24 hour I-123 uptake = 43.7% (normal 10-30%)   IMPRESSION: Normal thyroid scan.  Mildly elevated 4 hour and 24 hour radio iodine uptakes.  Findings consistent with hyperthyroidism and Graves disease.   4 hour and 24 hour radio iodine uptakes have decreased since  the previous exam.    Recent Results (from the past 2160 hour(s))  TSH     Status: Abnormal   Collection Time: 04/24/20  3:45 PM  Result Value Ref Range   TSH 41.700 (H) 0.450 - 4.500 uIU/mL  T4, free     Status: Abnormal   Collection Time: 04/24/20  3:45 PM  Result Value Ref Range   Free T4 0.46 (L) 0.82 - 1.77 ng/dL  T3, free     Status: Abnormal   Collection Time: 04/24/20  3:45 PM  Result Value Ref Range   T3, Free 1.2 (L) 2.0 - 4.4 pg/mL  TSH     Status: Abnormal   Collection Time: 07/08/20  3:45 PM  Result Value Ref Range   TSH 32.500 (H) 0.450 - 4.500 uIU/mL  T4, free     Status: None   Collection Time: 07/08/20  3:45 PM  Result Value Ref Range   Free T4 1.12 0.82 - 1.77 ng/dL   On February 21, 2020 she received second dose of RAI thyroid ablative therapy: Patient then received the radiopharmaceutical without immediate complication.   RADIOPHARMACEUTICALS:  25.8 mCi I-131 sodium iodide orally   IMPRESSION: Per oral administration of I-131 sodium iodide for the treatment of recurrent hyperthyroidism.  Assessment & Plan:   1.  RAI induced hypothyroidism  2. Graves' disease 2.  Type 2 diabetes  -She  has responded to the second treatment with RAI with clinical and biochemical hypothyroidism.    Her previsit labs are consistent with inadequate replacement.  I discussed and increase her levothyroxine to 150 mcg p.o. daily before breakfast.    - We discussed about the correct intake of her thyroid hormone, on empty stomach at fasting, with water, separated by at least 30 minutes from breakfast and other medications,  and separated by more than 4 hours from calcium, iron, multivitamins, acid reflux medications (PPIs). -Patient is made aware of the fact that thyroid hormone replacement is needed for life, dose to be adjusted by periodic monitoring of thyroid  function tests.  -Regarding her type 2 diabetes, her A1c is 6.1%.  She is on metformin 1000 mg p.o. twice daily.   She will have A1c during her next visit, last A1c in March was 6.1%.     - I advised her to maintain close follow up with Andres Shad, MD for primary care needs.  I spent 25 minutes in the care of the patient today including review of labs from Thyroid Function, CMP, and other relevant labs ; imaging/biopsy records (current and previous including abstractions from other facilities); face-to-face time discussing  her lab results and symptoms, medications doses, her options of short and long term treatment based on the latest standards of care / guidelines;   and documenting the encounter.  Angelyne Terwilliger Cafaro  participated in the discussions, expressed understanding, and voiced agreement with the above plans.  All questions were answered to her satisfaction. she is encouraged to contact clinic should she have any questions or concerns prior to her return visit.  Follow up plan: Return in about 4 months (around 11/20/2020) for F/U with Pre-visit Labs, A1c -NV.   Thank you for involving me in the care of this pleasant patient, and I will continue to update you with her progress.  Glade Lloyd, MD [facility] Phone: [phone]  Fax: [phone]   07/21/2020, 5:42 PM  This note was partially dictated with voice recognition software. Similar sounding words can be transcribed inadequately or may not  be corrected upon review.

## 2020-07-21 NOTE — Patient Instructions (Signed)

## 2020-07-28 ENCOUNTER — Ambulatory Visit: Payer: BC Managed Care – PPO | Admitting: Orthopaedic Surgery

## 2020-08-20 ENCOUNTER — Encounter: Payer: Self-pay | Admitting: Gastroenterology

## 2020-08-20 ENCOUNTER — Encounter: Payer: Self-pay | Admitting: Internal Medicine

## 2020-08-20 ENCOUNTER — Telehealth: Payer: Self-pay | Admitting: *Deleted

## 2020-08-20 ENCOUNTER — Telehealth (INDEPENDENT_AMBULATORY_CARE_PROVIDER_SITE_OTHER): Payer: Self-pay | Admitting: Gastroenterology

## 2020-08-20 ENCOUNTER — Other Ambulatory Visit: Payer: Self-pay

## 2020-08-20 DIAGNOSIS — D509 Iron deficiency anemia, unspecified: Secondary | ICD-10-CM

## 2020-08-20 DIAGNOSIS — E559 Vitamin D deficiency, unspecified: Secondary | ICD-10-CM | POA: Insufficient documentation

## 2020-08-20 DIAGNOSIS — K900 Celiac disease: Secondary | ICD-10-CM

## 2020-08-20 DIAGNOSIS — R1011 Right upper quadrant pain: Secondary | ICD-10-CM

## 2020-08-20 NOTE — Progress Notes (Signed)
Primary Care Physician:  Andres Shad, MD  Primary GI: Dr. Gala Romney  Patient Location: Home   Provider Location: [facility] office   Reason for Visit: Follow-up    Persons present on the virtual encounter, with roles: Patient and NP   Total time (minutes) spent on medical discussion: 15 minutes   Due to COVID-19, visit was conducted using virtual method.  Visit was requested by patient.  Virtual Visit via MyChart Video Note Due to COVID-19, visit is conducted virtually and was requested by patient.   I connected with Melissa Schmidt on 08/20/20 at  9:30 AM EDT by video and verified that I am speaking with the correct person using two identifiers.   I discussed the limitations, risks, security and privacy concerns of performing an evaluation and management service by video and the availability of in person appointments. I also discussed with the patient that there may be a patient responsible charge related to this service. The patient expressed understanding and agreed to proceed.  Chief Complaint  Patient presents with   Abdominal Pain    Ruq, on and off     History of Present Illness: Melissa Schmidt is a 45 year old female presenting today with history of celiac disease diagnosed in 2017, chronic GERD, fatty liver, intermittent RUQ, chronic IDA with worsening iron deficiency recently and requiring IV iron. Followed by Hematology [facility]). Recent labs with Hgb 11.3, which is near her baseline. Ferritin low normal at 27 and iron low at 22.   RUQ pain worsening over past 7-8 months. Sometimes postprandial component. No gallstones on [place] in 2020. No hematochezia or melena. No overt GI bleeding. Denies NSAIDs. Uses tylenol products. No fatigue. GERD controlled with PPI daily. Rare flares due to diet. No dysphagia.   Last colonoscopy in 2017 without polyps. Last EGD in [place] 2019. DEXA scan on file from Nov 2020 without osteopenia. Known Vit D deficiency and improved on  VIt D supplementation. Last Vit D labs in July 2021.    Past Medical History:  Diagnosis Date   Arthritis    Borderline diabetes    Celiac disease    Complication of anesthesia    shaking post anesthesia   Diabetes mellitus without complication ([facility])    Hemorrhoids    Hypertriglyceridemia    PCOS (polycystic ovarian syndrome)      Past Surgical History:  Procedure Laterality Date   APPENDECTOMY  2002   COLONOSCOPY WITH PROPOFOL N/A 05/25/2015   Dr.Rourk- non-bleeding hemorrhoids, the examination was o/w normal   ESOPHAGEAL DILATION N/A 05/25/2015   Procedure: ESOPHAGEAL DILATION;  Surgeon: Daneil Dolin, MD;  [facility];  Service: Endoscopy;  Laterality: N/A;   ESOPHAGOGASTRODUODENOSCOPY  04/2017   [place]: EGD with irregular Z-line s/p biopsy, benign-appearing esophageal stenosis s/p dilation, normal examined duodenum but no biopsies of duodenum. Pathology with junctional type GE mucosa with chronic inflammation and surface erosion, no metaplasia, negative dysplasia.    ESOPHAGOGASTRODUODENOSCOPY (EGD) WITH PROPOFOL N/A 05/25/2015   Dr.Rourk- normal esophagus, dilated, gastric erosions, intact fundoplication, normal third portion of the duodenum, non-bleeding erosive gastropathy. duodenum bx= benign small bowel mucosa with patchy increased intraepithelial lymphocytes, mild villous blunting and mild crypt hyperplasia, no dysplasia or malignancy stomach bx= erosive gastritis with reactive changes   NISSEN FUNDOPLICATION  [id number]     Current Meds  Medication Sig   Ascorbic Acid (VITAMIN C ADULT GUMMIES PO) Take by mouth daily.   azelastine (ASTELIN) 0.1 %  nasal spray Place 1 spray into both nostrils 2 (two) times daily. Use in each nostril as directed   BREO ELLIPTA 200-25 MCG/INH AEPB Inhale 1 puff into the lungs daily.   ferrous sulfate 325 (65 FE) MG EC tablet Take 325 mg by mouth daily with breakfast.   fluticasone (FLONASE) 50 MCG/ACT nasal spray Place 2 sprays into  both nostrils as needed.    glimepiride (AMARYL) 1 MG tablet Take 1 mg by mouth daily.   hydrocortisone (ANUSOL-HC) 2.5 % rectal cream Place 1 application rectally 2 (two) times daily. (Patient taking differently: Place 1 application rectally as needed.)   levothyroxine (SYNTHROID) 150 MCG tablet Take 1 tablet (150 mcg total) by mouth daily before breakfast.   metFORMIN (GLUCOPHAGE) 1000 MG tablet Take 1,000 mg by mouth 2 (two) times daily with a meal.    methocarbamol (ROBAXIN) 500 MG tablet Take 1 tablet (500 mg total) by mouth 2 (two) times daily as needed.   Multiple Vitamin (MULTIVITAMIN) capsule Take 1 capsule by mouth daily.   pantoprazole (PROTONIX) 40 MG tablet Take 1 tablet (40 mg total) by mouth daily before breakfast.   VITAMIN D PO Take 2,000 mg by mouth daily.     Family History  Problem Relation Age of Onset   Irritable bowel syndrome Mother    Colon polyps Mother        ? 5 year surveillance   Inflammatory bowel disease Neg Hx    Colon cancer Neg Hx    Liver disease Neg Hx     Social History   Socioeconomic History   Marital status: Married    Spouse name: Not on file   Number of children: 1   Years of education: Not on file   Highest education level: Not on file  Occupational History   Occupation: in school  Tobacco Use   Smoking status: Never   Smokeless tobacco: Never  Vaping Use   Vaping Use: Never used  Substance and Sexual Activity   Alcohol use: No    Alcohol/week: 0.0 standard drinks   Drug use: No   Sexual activity: Yes    Birth control/protection: None  Other Topics Concern   Not on file  Social History Narrative   Not on file   Social Determinants of Health   Financial Resource Strain: Not on file  Food Insecurity: Not on file  Transportation Needs: Not on file  Physical Activity: Not on file  Stress: Not on file  Social Connections: Not on file       Review of Systems: Gen: Denies fever, chills, anorexia. Denies fatigue,  weakness, weight loss.  CV: Denies chest pain, palpitations, syncope, peripheral edema, and claudication. Resp: Denies dyspnea at rest, cough, wheezing, coughing up blood, and pleurisy. GI: see HPI Derm: Denies rash, itching, dry skin Psych: Denies depression, anxiety, memory loss, confusion. No homicidal or suicidal ideation.  Heme: Denies bruising, bleeding, and enlarged lymph nodes.  Observations/Objective: No distress. Unable to perform physical exam due to video. Ht 5'3", Wt 314. BMI 55.6.   Assessment and Plan: Melissa Schmidt is a 45 year old female presenting today with history of celiac disease diagnosed in 2017, chronic GERD, fatty liver, intermittent RUQ now worsening, chronic IDA with worsening iron deficiency recently and requiring IV iron.   RUQ pain: postprandial component intermittently. Gallbladder present. No gallstones on prior US. Known fatty liver. Denies NSAIDs. Will pursue HIDA in near future. Also arranging EGD due to worsening IDA. Unable to exclude gastritis/duodenitis, PUD  as culprit for RUQ pain. Known celiac disease as well. Need to assess compliance with gluten-free diet via serologies.   IDA: chronic, likely due to celiac disease. Last colonoscopy in 2017 without polyps; last EGD in 2019 unrevealing. No prior capsule. With need for recent IV iron, will pursue colonoscopy/EGD. May need capsule. Recent Hgb at baseline in the 11 range, ferritin low normal at 27 and iron low at 22. Continue to follow with Hematology.    Celiac disease: I query if there is inadvertent gluten exposure or noncompliance with diet. Checking serologies.  Proceed with colonoscopy/EGD by Dr. Gala Romney in near future with Propofol due to IDA, RUQ pain. ASA 3. The risks, benefits, and alternatives have been discussed with the patient in detail. The patient states understanding and desires to proceed. Check Vit D and TTg, IgA (history of Vit D deficiency) Arrange DEXA in Nov 2022 at next  visit Follow-up in 4 months    Follow Up Instructions:    I discussed the assessment and treatment plan with the patient. The patient was provided an opportunity to ask questions and all were answered. The patient agreed with the plan and demonstrated an understanding of the instructions.   The patient was advised to call back or seek an in-person evaluation if the symptoms worsen or if the condition fails to improve as anticipated.  I provided 15 minutes of face-to-face time during this MyChart Video encounter.  Annitta Needs, PhD, ANP-BC [facility]

## 2020-08-20 NOTE — Telephone Encounter (Signed)
Pt consented to a virtual visit. 

## 2020-08-20 NOTE — Telephone Encounter (Signed)
HIDA scheduled for 7/25 at 10:00am, arrival 9:45am, npo midnight, no pain meds after midnight.  Called pt and made aware. She has also been provided with phone # to r/s as she will be out of town that day. I also advised we will call once we receive Dr. Roseanne Kaufman schedule to schedule her for TCS/EGD with propofol, ASA 3

## 2020-08-20 NOTE — Telephone Encounter (Signed)
Melissa Schmidt, you are scheduled for a virtual visit with your provider today.  Just as we do with appointments in the office, we must obtain your consent to participate.  Your consent will be active for this visit and any virtual visit you may have with one of our providers in the next 365 days.  If you have a MyChart account, I can also send a copy of this consent to you electronically.  All virtual visits are billed to your insurance company just like a traditional visit in the office.  As this is a virtual visit, video technology does not allow for your provider to perform a traditional examination.  This may limit your provider's ability to fully assess your condition.  If your provider identifies any concerns that need to be evaluated in person or the need to arrange testing such as labs, EKG, etc, we will make arrangements to do so.  Although advances in technology are sophisticated, we cannot ensure that it will always work on either your end or our end.  If the connection with a video visit is poor, we may have to switch to a telephone visit.  With either a video or telephone visit, we are not always able to ensure that we have a secure connection.   I need to obtain your verbal consent now.   Are you willing to proceed with your visit today?

## 2020-08-20 NOTE — Patient Instructions (Addendum)
We are arranging a colonoscopy and upper endoscopy in the near future.   We have also ordered a HIDA scan to assess your gallbladder function.   Please have Vitamin D labs done at [facility].  Please stop iron 10 days before the colonoscopy/endoscopy. No metformin or glimepiride the day of the procedrues.   We will see you in 4 months!   I enjoyed seeing you again today! As you know, I value our relationship and want to provide genuine, compassionate, and quality care. I welcome your feedback. If you receive a survey regarding your visit,  I greatly appreciate you taking time to fill this out. See you next time!  Annitta Needs, PhD, ANP-BC [facility]

## 2020-08-31 ENCOUNTER — Other Ambulatory Visit (HOSPITAL_COMMUNITY): Payer: Self-pay

## 2020-09-03 ENCOUNTER — Ambulatory Visit (HOSPITAL_COMMUNITY): Payer: Self-pay

## 2020-09-04 ENCOUNTER — Other Ambulatory Visit: Payer: Self-pay

## 2020-09-04 MED ORDER — PEG 3350-KCL-NA BICARB-NACL 420 G PO SOLR: 4000.0000 mL | ORAL | 0 refills | Status: DC

## 2020-09-04 NOTE — Telephone Encounter (Signed)
Spoke to pt, TCS/EGD scheduled for 10/22/20 at 9:15am. Rx for prep sent to pharmacy. Orders entered.

## 2020-09-04 NOTE — Telephone Encounter (Signed)
Tried to call pt to schedule TCS/EGD, LMOVM for return call.

## 2020-09-07 ENCOUNTER — Encounter: Payer: Self-pay | Admitting: *Deleted

## 2020-09-08 ENCOUNTER — Other Ambulatory Visit: Payer: Self-pay

## 2020-09-08 ENCOUNTER — Encounter (HOSPITAL_COMMUNITY): Payer: Self-pay

## 2020-09-08 ENCOUNTER — Ambulatory Visit (HOSPITAL_COMMUNITY): Payer: Self-pay

## 2020-09-08 ENCOUNTER — Ambulatory Visit (INDEPENDENT_AMBULATORY_CARE_PROVIDER_SITE_OTHER): Payer: BC Managed Care – PPO | Admitting: Orthopaedic Surgery

## 2020-09-08 VITALS — Ht 64.0 in | Wt 317.0 lb

## 2020-09-08 DIAGNOSIS — G8929 Other chronic pain: Secondary | ICD-10-CM

## 2020-09-08 DIAGNOSIS — M25561 Pain in right knee: Secondary | ICD-10-CM | POA: Diagnosis not present

## 2020-09-08 MED ORDER — DIAZEPAM 2 MG PO TABS: ORAL_TABLET | ORAL | 0 refills | Status: DC

## 2020-09-08 NOTE — Progress Notes (Signed)
Office Visit Note   Patient: Melissa Schmidt           Date of Birth: Oct 20, 1975           MRN: [id number] Visit Date: 09/08/2020              Requested by: Andres Shad, MD [address] [place] [address] PCP: Andres Shad, MD   Assessment & Plan: Visit Diagnoses:  1. Chronic pain of right knee     Plan: Impression is chronic right knee pain concerning for meniscal pathology.  Even though the patient does have underlying osteoarthritis, believe it is appropriate to order an MRI to assess for the severity in addition to meniscal pathology.  Patient agrees and would like to proceed.  She will follow-up with [place] once completed.  Call with concerns or questions in the meantime.  Follow-Up Instructions: No follow-ups on file.   Orders:  No orders of the defined types were placed in this encounter.  No orders of the defined types were placed in this encounter.     Procedures: No procedures performed   Clinical Data: No additional findings.   Subjective: Chief Complaint  Patient presents with   Right Knee - Pain    HPI patient is a pleasant 45 year old female who comes in today with recurrent right knee pain.  She was seen in our office about 3 months ago for this.  Cortisone injection performed which provided little relief.  She continues to endorse pain primarily to the medial aspect.  Worse with walking and pivoting.  She does note occasional swelling.  She also notes that 2 weeks ago she was bending down to dryer when she felt something shift around the medial knee.  She has had increased pain since.  Review of Systems as detailed in HPI.  All others reviewed and are negative.   Objective: Vital Signs: Ht 5' 4"  (1.626 m)   Wt (!) 317 lb (143.8 kg)   BMI 54.41 kg/m   Physical Exam well-developed well-nourished female in no acute distress.  Alert and oriented x3.  Ortho Exam right knee exam shows range of motion from 0 to 95 degrees.  Medial  and lateral joint line tenderness.  No patellar apprehension.  Stable valgus varus stress.  She is neurovascular intact distally.  Specialty Comments:  No specialty comments available.  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   Hemorrhoids 12/11/2018   Graves' disease 11/27/2018   RUQ abdominal pain 10/10/2018   Celiac disease 10/10/2018   GERD (gastroesophageal reflux disease) 10/10/2018   Hyperthyroidism 10/10/2017   Uncontrolled type 2 diabetes mellitus with hyperglycemia ([place]) 10/10/2017   Mucosal abnormality of stomach    Dysphagia    Positive autoantibody screening for celiac disease 04/29/2015   Fatty liver 16/60/6004   Colicky RUQ abdominal pain 03/19/2015   RLQ abdominal pain 03/19/2015   Rectal bleeding 03/19/2015   Past Medical History:  Diagnosis Date   Arthritis    Borderline diabetes    Celiac disease    Complication of anesthesia    shaking post anesthesia   Diabetes mellitus without complication ([place])    Hemorrhoids  Hypertriglyceridemia    PCOS (polycystic ovarian syndrome)     Family History  Problem Relation Age of Onset   Irritable bowel syndrome Mother    Colon polyps Mother        ? 5 year surveillance   Inflammatory bowel disease Neg Hx    Colon cancer Neg Hx    Liver disease Neg Hx     Past Surgical History:  Procedure Laterality Date   APPENDECTOMY  2002   COLONOSCOPY WITH PROPOFOL N/A 05/25/2015   Dr.Rourk- non-bleeding hemorrhoids, the examination was o/w normal   ESOPHAGEAL DILATION N/A 05/25/2015   Procedure: ESOPHAGEAL DILATION;  Surgeon: Daneil Dolin, MD;  Location[facility];  Service: Endoscopy;   Laterality: N/A;   ESOPHAGOGASTRODUODENOSCOPY  04/2017   [place]: EGD with irregular Z-line s/p biopsy, benign-appearing esophageal stenosis s/p dilation, normal examined duodenum but no biopsies of duodenum. Pathology with junctional type GE mucosa with chronic inflammation and surface erosion, no metaplasia, negative dysplasia.    ESOPHAGOGASTRODUODENOSCOPY (EGD) WITH PROPOFOL N/A 05/25/2015   Dr.Rourk- normal esophagus, dilated, gastric erosions, intact fundoplication, normal third portion of the duodenum, non-bleeding erosive gastropathy. duodenum bx= benign small bowel mucosa with patchy increased intraepithelial lymphocytes, mild villous blunting and mild crypt hyperplasia, no dysplasia or malignancy stomach bx= erosive gastritis with reactive changes   NISSEN FUNDOPLICATION  6701   Social History   Occupational History   Occupation: in school  Tobacco Use   Smoking status: Never   Smokeless tobacco: Never  Vaping Use   Vaping Use: Never used  Substance and Sexual Activity   Alcohol use: No    Alcohol/week: 0.0 standard drinks   Drug use: No   Sexual activity: Yes    Birth control/protection: None

## 2020-09-17 ENCOUNTER — Encounter (HOSPITAL_COMMUNITY)
Admission: RE | Admit: 2020-09-17 | Discharge: 2020-09-17 | Disposition: A | Discharge status: Home / Self Care | Payer: BC Managed Care – PPO | Source: Ambulatory Visit | Attending: Gastroenterology | Admitting: Gastroenterology

## 2020-09-17 ENCOUNTER — Other Ambulatory Visit: Payer: Self-pay

## 2020-09-17 DIAGNOSIS — R1011 Right upper quadrant pain: Secondary | ICD-10-CM | POA: Diagnosis not present

## 2020-09-17 MED ORDER — TECHNETIUM TC 99M MEBROFENIN IV KIT
5.0000 | PACK | Freq: Once | INTRAVENOUS | Status: AC | PRN
  Administered 2020-09-17: 5.05 via INTRAVENOUS

## 2020-09-23 ENCOUNTER — Telehealth: Payer: Self-pay | Admitting: Internal Medicine

## 2020-09-23 NOTE — Telephone Encounter (Signed)
Pt returning call. [phone]

## 2020-09-23 NOTE — Telephone Encounter (Signed)
Lmom for pt to return my call.  

## 2020-09-24 NOTE — Telephone Encounter (Signed)
Spoke with pt. See other note.

## 2020-09-29 ENCOUNTER — Telehealth: Payer: Self-pay | Admitting: Gastroenterology

## 2020-09-29 NOTE — Telephone Encounter (Signed)
Pt states that she had a little pain with the [place] scan. Pt is wondering if the pain could be coming from her fatty liver? Pt states that the pain comes and goes, but that she is overall doing ok.

## 2020-09-29 NOTE — Telephone Encounter (Signed)
Noted  

## 2020-09-29 NOTE — Telephone Encounter (Signed)
I have sent a note in MyChart to patient.

## 2020-10-05 ENCOUNTER — Telehealth: Payer: Self-pay | Admitting: Orthopaedic Surgery

## 2020-10-05 NOTE — Telephone Encounter (Signed)
Called patient left message on voicemail to return call to schedule an appointment for MRI review with Dr Erlinda Hong

## 2020-10-16 NOTE — Patient Instructions (Signed)
Melissa Schmidt  10/16/2020     @PREFPERIOPPHARMACY @   Your procedure is scheduled on  10/22/2020.   Report to [facility] at  0730  A.M.   Call this number if you have problems the morning of surgery:  [phone]   Remember:  Follow the diet and prep instructions given to you by the office.     DO NOT take any medications for diabetes the morning of your procedure.    Take these medicines the morning of surgery with A SIP OF WATER            levothyroxine, robaxin (if needed), protonix.    Do not wear jewelry, make-up or nail polish.  Do not wear lotions, powders, or perfumes, or deodorant.  Do not shave 48 hours prior to surgery.  Men may shave face and neck.  Do not bring valuables to the hospital.  [facility] is not responsible for any belongings or valuables.  Contacts, dentures or bridgework may not be worn into surgery.  Leave your suitcase in the car.  After surgery it may be brought to your room.  For patients admitted to the hospital, discharge time will be determined by your treatment team.  Patients discharged the day of surgery will not be allowed to drive home and must have someone with them for 24 hours.    Special instructions:   DO NOT smoke tobacco or vape for 24 hours before your procedure.  Please read over the following fact sheets that you were given. Coughing and Deep Breathing, Surgical Site Infection Prevention, Anesthesia Post-op Instructions, and Care and Recovery After Surgery      Upper Endoscopy, Adult, Care After This sheet gives you information about how to care for yourself after your procedure. Your health care provider may also give you more specific instructions. If you have problems or questions, contact your health care provider. What can I expect after the procedure? After the procedure, it is common to have: A sore throat. Mild stomach pain or discomfort. Bloating. Nausea. Follow these instructions at home:  Follow  instructions from your health care provider about what to eat or drink after your procedure. Return to your normal activities as told by your health care provider. Ask your health care provider what activities are safe for you. Take over-the-counter and prescription medicines only as told by your health care provider. If you were given a sedative during the procedure, it can affect you for several hours. Do not drive or operate machinery until your health care provider says that it is safe. Keep all follow-up visits as told by your health care provider. This is important. Contact a health care provider if you have: A sore throat that lasts longer than one day. Trouble swallowing. Get help right away if: You vomit blood or your vomit looks like coffee grounds. You have: A fever. Bloody, black, or tarry stools. A severe sore throat or you cannot swallow. Difficulty breathing. Severe pain in your chest or abdomen. Summary After the procedure, it is common to have a sore throat, mild stomach discomfort, bloating, and nausea. If you were given a sedative during the procedure, it can affect you for several hours. Do not drive or operate machinery until your health care provider says that it is safe. Follow instructions from your health care provider about what to eat or drink after your procedure. Return to your normal activities as told by your health care  provider. This information is not intended to replace advice given to you by your health care provider. Make sure you discuss any questions you have with your health care provider. Document Revised: 01/22/2019 Document Reviewed: 06/26/2017 Elsevier Patient Education  2022 [place]. Colonoscopy, Adult, Care After This sheet gives you information about how to care for yourself after your procedure. Your health care provider may also give you more specific instructions. If you have problems or questions, contact your health care  provider. What can I expect after the procedure? After the procedure, it is common to have: A small amount of blood in your stool for 24 hours after the procedure. Some gas. Mild cramping or bloating of your abdomen. Follow these instructions at home: Eating and drinking  Drink enough fluid to keep your urine pale yellow. Follow instructions from your health care provider about eating or drinking restrictions. Resume your normal diet as instructed by your health care provider. Avoid heavy or fried foods that are hard to digest. Activity Rest as told by your health care provider. Avoid sitting for a long time without moving. Get up to take short walks every 1-2 hours. This is important to improve blood flow and breathing. Ask for help if you feel weak or unsteady. Return to your normal activities as told by your health care provider. Ask your health care provider what activities are safe for you. Managing cramping and bloating  Try walking around when you have cramps or feel bloated. Apply heat to your abdomen as told by your health care provider. Use the heat source that your health care provider recommends, such as a moist heat pack or a heating pad. Place a towel between your skin and the heat source. Leave the heat on for 20-30 minutes. Remove the heat if your skin turns bright red. This is especially important if you are unable to feel pain, heat, or cold. You may have a greater risk of getting burned. General instructions If you were given a sedative during the procedure, it can affect you for several hours. Do not drive or operate machinery until your health care provider says that it is safe. For the first 24 hours after the procedure: Do not sign important documents. Do not drink alcohol. Do your regular daily activities at a slower pace than normal. Eat soft foods that are easy to digest. Take over-the-counter and prescription medicines only as told by your health care  provider. Keep all follow-up visits as told by your health care provider. This is important. Contact a health care provider if: You have blood in your stool 2-3 days after the procedure. Get help right away if you have: More than a small spotting of blood in your stool. Large blood clots in your stool. Swelling of your abdomen. Nausea or vomiting. A fever. Increasing pain in your abdomen that is not relieved with medicine. Summary After the procedure, it is common to have a small amount of blood in your stool. You may also have mild cramping and bloating of your abdomen. If you were given a sedative during the procedure, it can affect you for several hours. Do not drive or operate machinery until your health care provider says that it is safe. Get help right away if you have a lot of blood in your stool, nausea or vomiting, a fever, or increased pain in your abdomen. This information is not intended to replace advice given to you by your health care provider. Make sure you discuss any  questions you have with your health care provider. Document Revised: 01/18/2019 Document Reviewed: 08/20/2018 Elsevier Patient Education  [place] After This sheet gives you information about how to care for yourself after your procedure. Your health care provider may also give you more specific instructions. If you have problems or questions, contact your health care provider. What can I expect after the procedure? After the procedure, it is common to have: Tiredness. Forgetfulness about what happened after the procedure. Impaired judgment for important decisions. Nausea or vomiting. Some difficulty with balance. Follow these instructions at home: For the time period you were told by your health care provider:   Rest as needed. Do not participate in activities where you could fall or become injured. Do not drive or use machinery. Do not drink alcohol. Do not  take sleeping pills or medicines that cause drowsiness. Do not make important decisions or sign legal documents. Do not take care of children on your own. Eating and drinking Follow the diet that is recommended by your health care provider. Drink enough fluid to keep your urine pale yellow. If you vomit: Drink water, juice, or soup when you can drink without vomiting. Make sure you have little or no nausea before eating solid foods. General instructions Have a responsible adult stay with you for the time you are told. It is important to have someone help care for you until you are awake and alert. Take over-the-counter and prescription medicines only as told by your health care provider. If you have sleep apnea, surgery and certain medicines can increase your risk for breathing problems. Follow instructions from your health care provider about wearing your sleep device: Anytime you are sleeping, including during daytime naps. While taking prescription pain medicines, sleeping medicines, or medicines that make you drowsy. Avoid smoking. Keep all follow-up visits as told by your health care provider. This is important. Contact a health care provider if: You keep feeling nauseous or you keep vomiting. You feel light-headed. You are still sleepy or having trouble with balance after 24 hours. You develop a rash. You have a fever. You have redness or swelling around the IV site. Get help right away if: You have trouble breathing. You have new-onset confusion at home. Summary For several hours after your procedure, you may feel tired. You may also be forgetful and have poor judgment. Have a responsible adult stay with you for the time you are told. It is important to have someone help care for you until you are awake and alert. Rest as told. Do not drive or operate machinery. Do not drink alcohol or take sleeping pills. Get help right away if you have trouble breathing, or if you suddenly  become confused. This information is not intended to replace advice given to you by your health care provider. Make sure you discuss any questions you have with your health care provider. Document Revised: 10/10/2019 Document Reviewed: 12/27/2018 Elsevier Patient Education  2022 [employer].

## 2020-10-19 ENCOUNTER — Telehealth: Payer: Self-pay | Admitting: Internal Medicine

## 2020-10-19 NOTE — Telephone Encounter (Signed)
Called pt, informed her we will call to reschedule TCS/EGD when Dr. Roseanne Kaufman November schedule is available. Endo scheduler informed to cancel TCS/EGD scheduled for 10/22/20.

## 2020-10-19 NOTE — Telephone Encounter (Signed)
Pt wants to reschedule her procedure with Dr Gala Romney on 9/15. Please call [phone]

## 2020-10-20 ENCOUNTER — Encounter (HOSPITAL_COMMUNITY): Payer: Self-pay

## 2020-10-20 ENCOUNTER — Encounter (HOSPITAL_COMMUNITY)
Admission: RE | Admit: 2020-10-20 | Discharge: 2020-10-20 | Disposition: A | Discharge status: Home / Self Care | Payer: BC Managed Care – PPO | Source: Ambulatory Visit | Attending: Internal Medicine | Admitting: Internal Medicine

## 2020-10-22 ENCOUNTER — Encounter (HOSPITAL_COMMUNITY): Admission: RE | Payer: Self-pay | Source: Home / Self Care

## 2020-10-22 ENCOUNTER — Ambulatory Visit (HOSPITAL_COMMUNITY)
Admission: RE | Admit: 2020-10-22 | Payer: BC Managed Care – PPO | Source: Home / Self Care | Admitting: Internal Medicine

## 2020-10-22 SURGERY — COLONOSCOPY WITH PROPOFOL
Anesthesia: Monitor Anesthesia Care

## 2020-10-28 ENCOUNTER — Telehealth: Payer: Self-pay | Admitting: Internal Medicine

## 2020-10-28 NOTE — Telephone Encounter (Signed)
Spoke with pt and made her aware that orders are in system. Pt verbalized understanding.

## 2020-10-28 NOTE — Telephone Encounter (Signed)
Please send patient lab paper to [facility] in [place].  She forgot to have her labs done and she can not find the order

## 2020-10-31 ENCOUNTER — Encounter (HOSPITAL_COMMUNITY): Payer: Self-pay

## 2020-10-31 ENCOUNTER — Ambulatory Visit (HOSPITAL_COMMUNITY): Admission: RE | Admit: 2020-10-31 | Payer: Self-pay | Source: Ambulatory Visit

## 2020-11-03 ENCOUNTER — Ambulatory Visit: Payer: Self-pay | Admitting: Orthopaedic Surgery

## 2020-11-03 ENCOUNTER — Other Ambulatory Visit: Payer: Self-pay

## 2020-11-03 ENCOUNTER — Ambulatory Visit (HOSPITAL_COMMUNITY)
Admission: RE | Admit: 2020-11-03 | Discharge: 2020-11-03 | Disposition: A | Discharge status: Home / Self Care | Payer: BC Managed Care – PPO | Source: Ambulatory Visit | Attending: Orthopaedic Surgery | Admitting: Orthopaedic Surgery

## 2020-11-03 DIAGNOSIS — M25561 Pain in right knee: Secondary | ICD-10-CM | POA: Diagnosis not present

## 2020-11-03 DIAGNOSIS — G8929 Other chronic pain: Secondary | ICD-10-CM

## 2020-11-05 ENCOUNTER — Ambulatory Visit: Payer: BC Managed Care – PPO | Admitting: Orthopaedic Surgery

## 2020-11-10 ENCOUNTER — Ambulatory Visit (INDEPENDENT_AMBULATORY_CARE_PROVIDER_SITE_OTHER): Payer: BC Managed Care – PPO | Admitting: Orthopaedic Surgery

## 2020-11-10 ENCOUNTER — Encounter: Payer: Self-pay | Admitting: Orthopaedic Surgery

## 2020-11-10 ENCOUNTER — Other Ambulatory Visit: Payer: Self-pay

## 2020-11-10 ENCOUNTER — Telehealth: Payer: Self-pay

## 2020-11-10 DIAGNOSIS — G8929 Other chronic pain: Secondary | ICD-10-CM | POA: Diagnosis not present

## 2020-11-10 DIAGNOSIS — M25561 Pain in right knee: Secondary | ICD-10-CM | POA: Diagnosis not present

## 2020-11-10 NOTE — Telephone Encounter (Signed)
Please submit for right knee gel inj

## 2020-11-10 NOTE — Progress Notes (Signed)
Office Visit Note   Patient: Melissa Schmidt           Date of Birth: May 28, 1975           MRN: [id number] Visit Date: 11/10/2020              Requested by: Andres Shad, MD [address] [place] [address] PCP: Andres Shad, MD   Assessment & Plan: Visit Diagnoses:  1. Chronic pain of right knee     Plan: Impression is chronic right knee pain with underlying patellofemoral syndrome and mild tricompartmental osteoarthritis.  The patient has tried oral anti-inflammatories, cortisone injections as well as physical therapy without relief of symptoms.  We have discussed providing her with a PSO brace and obtaining approval for viscosupplementation injection.  She will also continue to work on weight loss.  She will follow-up with [place] once approved for Visco injection.  Call with concerns or questions.  This patient is diagnosed with osteoarthritis of the knee(s).    Radiographs show evidence of joint space narrowing, osteophytes, subchondral sclerosis and/or subchondral cysts.  This patient has knee pain which interferes with functional and activities of daily living.    This patient has experienced inadequate response, adverse effects and/or intolerance with conservative treatments such as acetaminophen, NSAIDS, topical creams, physical therapy or regular exercise, knee bracing and/or weight loss.   This patient has experienced inadequate response or has a contraindication to intra articular steroid injections for at least 3 months.   This patient is not scheduled to have a total knee replacement within 6 months of starting treatment with viscosupplementation.   Follow-Up Instructions: Return if symptoms worsen or fail to improve.   Orders:  No orders of the defined types were placed in this encounter.  No orders of the defined types were placed in this encounter.     Procedures: No procedures performed   Clinical Data: No additional  findings.   Subjective: Chief Complaint  Patient presents with   Right Knee - Pain    HPI patient is a pleasant 45 year old female who comes in today to discuss MRI results of the right knee.  She has been dealing with chronic right knee pain for a while.  She recently felt like her knee subluxed when she was using the dryer.  She has previously undergone cortisone injection and been to physical therapy without relief of symptoms.  MRI of the right knee shows mild tricompartmental degenerative changes in addition to mild lateral patella subluxation.  She has not previously undergone viscosupplementation injection.  Review of Systems as detailed in HPI.  All others reviewed and are negative.   Objective: Vital Signs: There were no vitals taken for this visit.  Physical Exam  Ortho Exam unchanged right knee exam  Specialty Comments:  No specialty comments available.  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   Hemorrhoids 12/11/2018   Graves' disease 11/27/2018   RUQ abdominal pain 10/10/2018   Celiac disease 10/10/2018   GERD (gastroesophageal reflux disease) 10/10/2018   Hyperthyroidism 10/10/2017   Uncontrolled type 2 diabetes mellitus with hyperglycemia ([place]) 10/10/2017   Mucosal abnormality of stomach    Dysphagia  Positive autoantibody screening for celiac disease 04/29/2015   Fatty liver 77/41/2878   Colicky RUQ abdominal pain 03/19/2015   RLQ abdominal pain 03/19/2015   Rectal bleeding 03/19/2015   Past Medical History:  Diagnosis Date   Arthritis    Borderline diabetes    Celiac disease    Complication of anesthesia    shaking post  anesthesia   Diabetes mellitus without complication ([facility])    Hemorrhoids    Hypertriglyceridemia    PCOS (polycystic ovarian syndrome)     Family History  Problem Relation Age of Onset   Irritable bowel syndrome Mother    Colon polyps Mother        ? 5 year surveillance   Inflammatory bowel disease Neg Hx    Colon cancer Neg Hx    Liver disease Neg Hx     Past Surgical History:  Procedure Laterality Date   APPENDECTOMY  2002   COLONOSCOPY WITH PROPOFOL N/A 05/25/2015   Dr.Rourk- non-bleeding hemorrhoids, the examination was o/w normal   ESOPHAGEAL DILATION N/A 05/25/2015   Procedure: ESOPHAGEAL DILATION;  Surgeon: Daneil Dolin, MD;  [facility];  Service: Endoscopy;  Laterality: N/A;   ESOPHAGOGASTRODUODENOSCOPY  04/2017   [place]: EGD with irregular Z-line s/p biopsy, benign-appearing esophageal stenosis s/p dilation, normal examined duodenum but no biopsies of duodenum. Pathology with junctional type GE mucosa with chronic inflammation and surface erosion, no metaplasia, negative dysplasia.    ESOPHAGOGASTRODUODENOSCOPY (EGD) WITH PROPOFOL N/A 05/25/2015   Dr.Rourk- normal esophagus, dilated, gastric erosions, intact fundoplication, normal third portion of the duodenum, non-bleeding erosive gastropathy. duodenum bx= benign small bowel mucosa with patchy increased intraepithelial lymphocytes, mild villous blunting and mild crypt hyperplasia, no dysplasia or malignancy stomach bx= erosive gastritis with reactive changes   NISSEN FUNDOPLICATION  6767   Social History   Occupational History   Occupation: in school  Tobacco Use   Smoking status: Never   Smokeless tobacco: Never  Vaping Use   Vaping Use: Never used  Substance and Sexual Activity   Alcohol use: No    Alcohol/week: 0.0 standard drinks   Drug use: No   Sexual activity: Yes    Birth control/protection: None

## 2020-11-11 NOTE — Telephone Encounter (Signed)
Noted  

## 2020-11-18 ENCOUNTER — Encounter: Payer: Self-pay | Admitting: *Deleted

## 2020-11-18 NOTE — Telephone Encounter (Signed)
Patient returned call. She has been scheduled for 11/11. Aware will mail prep instructions with new pre-op appt. She already has her prep at home. Confirmed address.

## 2020-11-18 NOTE — Telephone Encounter (Signed)
Called pt, LMOVM to call back to reschedule procedure

## 2020-11-19 ENCOUNTER — Ambulatory Visit: Payer: BC Managed Care – PPO | Admitting: Urology

## 2020-11-19 DIAGNOSIS — R1310 Dysphagia, unspecified: Secondary | ICD-10-CM

## 2020-11-20 ENCOUNTER — Other Ambulatory Visit: Payer: Self-pay | Admitting: Gastroenterology

## 2020-11-20 DIAGNOSIS — K219 Gastro-esophageal reflux disease without esophagitis: Secondary | ICD-10-CM

## 2020-11-20 DIAGNOSIS — R1319 Other dysphagia: Secondary | ICD-10-CM

## 2020-11-23 ENCOUNTER — Ambulatory Visit: Payer: BC Managed Care – PPO | Admitting: "Endocrinology

## 2020-12-02 LAB — T4, FREE: Free T4: 1.27 ng/dL (ref 0.82–1.77)

## 2020-12-02 LAB — VITAMIN D 25 HYDROXY (VIT D DEFICIENCY, FRACTURES): Vit D, 25-Hydroxy: 20.2 ng/mL — ABNORMAL LOW (ref 30.0–100.0)

## 2020-12-02 LAB — TSH: TSH: 20.3 u[IU]/mL — ABNORMAL HIGH (ref 0.450–4.500)

## 2020-12-03 ENCOUNTER — Ambulatory Visit (INDEPENDENT_AMBULATORY_CARE_PROVIDER_SITE_OTHER): Payer: BC Managed Care – PPO | Admitting: "Endocrinology

## 2020-12-03 ENCOUNTER — Other Ambulatory Visit: Payer: Self-pay

## 2020-12-03 VITALS — BP 110/82 | HR 96 | Ht 64.0 in | Wt 311.0 lb

## 2020-12-03 DIAGNOSIS — E559 Vitamin D deficiency, unspecified: Secondary | ICD-10-CM | POA: Diagnosis not present

## 2020-12-03 DIAGNOSIS — E890 Postprocedural hypothyroidism: Secondary | ICD-10-CM

## 2020-12-03 DIAGNOSIS — E119 Type 2 diabetes mellitus without complications: Secondary | ICD-10-CM

## 2020-12-03 LAB — POCT GLYCOSYLATED HEMOGLOBIN (HGB A1C): HbA1c, POC (controlled diabetic range): 6.8 % (ref 0.0–7.0)

## 2020-12-03 MED ORDER — VITAMIN D3 125 MCG (5000 UT) PO CAPS: 5000.0000 [IU] | ORAL_CAPSULE | Freq: Every day | ORAL | 0 refills | Status: AC

## 2020-12-03 MED ORDER — LEVOTHYROXINE SODIUM 175 MCG PO TABS: 175.0000 ug | ORAL_TABLET | Freq: Every day | ORAL | 1 refills | Status: DC

## 2020-12-03 NOTE — Patient Instructions (Signed)

## 2020-12-03 NOTE — Progress Notes (Signed)
12/03/2020           Endocrinology follow-up note  Subjective:    Patient ID: Melissa Schmidt, female    DOB: 1975-10-14, PCP Andres Shad, MD.   Past Medical History:  Diagnosis Date   Arthritis    Borderline diabetes    Celiac disease    Complication of anesthesia    shaking post anesthesia   Diabetes mellitus without complication ([place])    Hemorrhoids    Hypertriglyceridemia    PCOS (polycystic ovarian syndrome)    Past Surgical History:  Procedure Laterality Date   APPENDECTOMY  2002   COLONOSCOPY WITH PROPOFOL N/A 05/25/2015   Dr.Rourk- non-bleeding hemorrhoids, the examination was o/w normal   ESOPHAGEAL DILATION N/A 05/25/2015   Procedure: ESOPHAGEAL DILATION;  Surgeon: Daneil Dolin, MD;  Location: [facility];  Service: Endoscopy;  Laterality: N/A;   ESOPHAGOGASTRODUODENOSCOPY  04/2017   [place]: EGD with irregular Z-line s/p biopsy, benign-appearing esophageal stenosis s/p dilation, normal examined duodenum but no biopsies of duodenum. Pathology with junctional type GE mucosa with chronic inflammation and surface erosion, no metaplasia, negative dysplasia.    ESOPHAGOGASTRODUODENOSCOPY (EGD) WITH PROPOFOL N/A 05/25/2015   Dr.Rourk- normal esophagus, dilated, gastric erosions, intact fundoplication, normal third portion of the duodenum, non-bleeding erosive gastropathy. duodenum bx= benign small bowel mucosa with patchy increased intraepithelial lymphocytes, mild villous blunting and mild crypt hyperplasia, no dysplasia or malignancy stomach bx= erosive gastritis with reactive changes   NISSEN FUNDOPLICATION  [id number]   Social History   Socioeconomic History   Marital status: Married    Spouse name: Not on file   Number of children: 1   Years of education: Not on file   Highest education level: Not on file  Occupational History   Occupation: in school  Tobacco Use   Smoking status: Never   Smokeless tobacco: Never  Vaping Use   Vaping Use: Never used   Substance and Sexual Activity   Alcohol use: No    Alcohol/week: 0.0 standard drinks   Drug use: No   Sexual activity: Yes    Birth control/protection: None  Other Topics Concern   Not on file  Social History Narrative   Not on file   Social Determinants of Health   Financial Resource Strain: Not on file  Food Insecurity: Not on file  Transportation Needs: Not on file  Physical Activity: Not on file  Stress: Not on file  Social Connections: Not on file   Outpatient Encounter Medications as of 12/03/2020  Medication Sig   Cholecalciferol (VITAMIN D3) 125 MCG (5000 UT) CAPS Take 1 capsule (5,000 Units total) by mouth daily.   Ascorbic Acid (VITAMIN C ADULT GUMMIES PO) Take by mouth daily.   azelastine (ASTELIN) 0.1 % nasal spray Place 1 spray into both nostrils 2 (two) times daily. Use in each nostril as directed   BREO ELLIPTA 200-25 MCG/INH AEPB Inhale 1 puff into the lungs daily.   ferrous sulfate 325 (65 FE) MG EC tablet Take 325 mg by mouth daily with breakfast.   fluticasone (FLONASE) 50 MCG/ACT nasal spray Place 2 sprays into both nostrils as needed.    glimepiride (AMARYL) 1 MG tablet Take 1 mg by mouth daily.   hydrocortisone (ANUSOL-HC) 2.5 % rectal cream Place 1 application rectally 2 (two) times daily. (Patient taking differently: Place 1 application rectally as needed.)   levothyroxine (SYNTHROID) 175 MCG tablet Take 1 tablet (175 mcg total) by mouth daily before breakfast.   metFORMIN (GLUCOPHAGE)  1000 MG tablet Take 1,000 mg by mouth 2 (two) times daily with a meal.    methocarbamol (ROBAXIN) 500 MG tablet Take 1 tablet (500 mg total) by mouth 2 (two) times daily as needed.   Multiple Vitamin (MULTIVITAMIN) capsule Take 1 capsule by mouth daily.   pantoprazole (PROTONIX) 40 MG tablet TAKE 1 TABLET BY MOUTH ONCE DAILY BEFORE BREAKFAST   polyethylene glycol-electrolytes (TRILYTE) 420 g solution Take 4,000 mLs by mouth as directed.   [DISCONTINUED] diazepam (VALIUM) 2  MG tablet Take one pill one hour prior to mri and then repeat if needed just prior to mri   [DISCONTINUED] levothyroxine (SYNTHROID) 150 MCG tablet Take 1 tablet (150 mcg total) by mouth daily before breakfast.   [DISCONTINUED] VITAMIN D PO Take 2,000 mg by mouth daily.   No facility-administered encounter medications on file as of 12/03/2020.    ALLERGIES: Allergies  Allergen Reactions   Singulair [Montelukast]     VACCINATION STATUS:  There is no immunization history on file for this patient.   HPI  Melissa Schmidt is 45 y.o. 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  First treatment was administered in November 2020, with inadequate response.  She was treated again in January 2022.  She was initiated on levothyroxine for RAI induced hypothyroidism during her prior visits.  Her previsit labs are consistent with inadequate replacement.  She is currently on levothyroxine 150 mcg p.o. daily before breakfast.   -Prior to treatment,  her thyroid uptake showed 68% in 24 hours  suggesting Graves' disease.  -She presents with improvement of her previous symptoms including fatigue.she has lost some weight since last visit.   She remains on metformin for type 2 diabetes well controlled with point-of-care A1c of 6.8% today.    she denies family history of thyroid dysfunction nor any thyroid cancer.                            Review of systems  Limited as above  Objective:    BP 110/82   Pulse 96   Ht 5' 4"  (1.626 m)   Wt (!) 311 lb (141.1 kg)   BMI 53.38 kg/m   Wt Readings from Last 3 Encounters:  12/03/20 (!) 311 lb (141.1 kg)  09/08/20 (!) 317 lb (143.8 kg)  07/21/20 (!) 316 lb 12.8 oz (143.7 kg)                   Component Value Date/Time   NA 140 11/05/2018 1551   K 4.6 11/05/2018 1551   CL 103 11/05/2018 1551   CO2 25 11/05/2018 1551   GLUCOSE 55 (L) 11/05/2018 1551   GLUCOSE 163 (H) 05/21/2015 1010    BUN 12 04/15/2020 0000   CREATININE 0.9 04/15/2020 0000   CREATININE 0.71 11/05/2018 1551   CALCIUM 9.1 04/15/2020 0000   PROT 6.8 12/17/2019 1106   PROT 6.8 11/05/2018 1551   ALBUMIN 4.2 11/05/2018 1551   AST 15 12/17/2019 1106   ALT 16 12/17/2019 1106   ALKPHOS 89 11/05/2018 1551   BILITOT 0.3 12/17/2019 1106   BILITOT <0.2 11/05/2018 1551   GFRNONAA 81 04/15/2020 0000   GFRAA 131 11/22/2019 0000     CBC    Component Value Date/Time   WBC 11.9 (H) 12/06/2019 1426   WBC 9.6 05/21/2015 1010   RBC 5.13 12/06/2019 1426   RBC  4.41 05/21/2015 1010   HGB 11.5 12/06/2019 1426   HCT 36.6 12/06/2019 1426   PLT 452 (H) 12/06/2019 1426   MCV 71 (L) 12/06/2019 1426   MCH 22.4 (L) 12/06/2019 1426   MCH 25.4 (L) 05/21/2015 1010   MCHC 31.4 (L) 12/06/2019 1426   MCHC 31.5 05/21/2015 1010   RDW 15.4 12/06/2019 1426   LYMPHSABS 3.6 (H) 12/06/2019 1426   EOSABS 0.2 12/06/2019 1426   BASOSABS 0.0 12/06/2019 1426     Diabetic Labs (most recent): Lab Results  Component Value Date   HGBA1C 6.8 12/03/2020   HGBA1C 6.1 04/15/2020   HGBA1C 6.2 11/22/2019    Lipid Panel     Component Value Date/Time   CHOL 224 (A) 04/15/2020 0000   TRIG 192 (A) 04/15/2020 0000   HDL 45 04/15/2020 0000   LDLCALC 144 04/15/2020 0000     Thyroid uptake and scan on October 20, 20 FINDINGS: Homogeneous tracer accumulation within both thyroid lobes, diffusely increased.   No focal areas of increased or decreased tracer localization.  4 hour I-123 uptake = 57% (normal 5-20%)  24 hour I-123 uptake = 68% (normal 10-30%)   IMPRESSION: Diffusely increased homogeneous tracer accumulation in both thyroid lobes.   Increased 4 hour and 24 hour radio iodine uptakes consistent with hyperthyroidism.  Findings consistent with Graves disease.  I-131 thyroid ablation on December 12, 2018  Her response was only partial. Repeat thyroid uptake and scan on January 16, 2020 FINDINGS: Homogeneous tracer  distribution in both thyroid lobes.  No focal areas of increased or decreased tracer localization.   4 hour I-123 uptake = 27.7% (normal 5-20%),  24 hour I-123 uptake = 43.7% (normal 10-30%)   IMPRESSION: Normal thyroid scan.  Mildly elevated 4 hour and 24 hour radio iodine uptakes.  Findings consistent with hyperthyroidism and Graves disease.   4 hour and 24 hour radio iodine uptakes have decreased since the previous exam.    Recent Results (from the past 2160 hour(s))  Vitamin D (25 hydroxy)     Status: Abnormal   Collection Time: 12/01/20  4:48 PM  Result Value Ref Range   Vit D, 25-Hydroxy 20.2 (L) 30.0 - 100.0 ng/mL    Comment: Vitamin D deficiency has been defined by the [place] practice guideline as a level of serum 25-OH vitamin D less than 20 ng/mL (1,2). The Endocrine Society went on to further define vitamin D insufficiency as a level between 21 and 29 ng/mL (2). 1. IOM (Institute of Medicine). 2010. Dietary reference    intakes for calcium and D. [place]: The    [employer]. 2. Holick MF, Binkley Burdett, Bischoff-Ferrari HA, et al.    Evaluation, treatment, and prevention of vitamin D    deficiency: an Endocrine Society clinical practice    guideline. JCEM. 2011 Jul; 96(7):1911-30.   TSH     Status: Abnormal   Collection Time: 12/01/20  4:49 PM  Result Value Ref Range   TSH 20.300 (H) 0.450 - 4.500 uIU/mL  T4, free     Status: None   Collection Time: 12/01/20  4:49 PM  Result Value Ref Range   Free T4 1.27 0.82 - 1.77 ng/dL  HgB A1c     Status: None   Collection Time: 12/03/20  1:14 PM  Result Value Ref Range   Hemoglobin A1C     HbA1c POC (<> result, manual entry)     HbA1c, POC (prediabetic range)  HbA1c, POC (controlled diabetic range) 6.8 0.0 - 7.0 %   On February 21, 2020 she received second dose of RAI thyroid ablative therapy: Patient then received the radiopharmaceutical without  immediate complication.   RADIOPHARMACEUTICALS:  25.8 mCi I-131 sodium iodide orally   IMPRESSION: Per oral administration of I-131 sodium iodide for the treatment of recurrent hyperthyroidism.  Assessment & Plan:   1.  RAI induced hypothyroidism  2. Graves' disease-resolved 3.  Type 2 diabetes 4.  Vitamin D deficiency  -She  has responded to the second treatment with RAI with clinical and biochemical hypothyroidism.    Her previsit labs are consistent with inadequate replacement.  I discussed and increased her levothyroxine to 175 mcg p.o. daily before breakfast.     - We discussed about the correct intake of her thyroid hormone, on empty stomach at fasting, with water, separated by at least 30 minutes from breakfast and other medications,  and separated by more than 4 hours from calcium, iron, multivitamins, acid reflux medications (PPIs). -Patient is made aware of the fact that thyroid hormone replacement is needed for life, dose to be adjusted by periodic monitoring of thyroid function tests.   -Regarding her type 2 diabetes, her point-of-care A1c is 6.8%.  She is advised to continue metformin 1000 mg p.o. twice daily.    For her vitamin D deficiency: I discussed and added vitamin D3 5000 units daily for the next 6 months on top of her 2000 units daily supplement.   - I advised her to maintain close follow up with Andres Shad, MD for primary care needs.   I spent 30 minutes in the care of the patient today including review of labs from Thyroid Function, CMP, and other relevant labs ; imaging/biopsy records (current and previous including abstractions from other facilities); face-to-face time discussing  her lab results and symptoms, medications doses, her options of short and long term treatment based on the latest standards of care / guidelines;   and documenting the encounter.  Melissa Schmidt  participated in the discussions, expressed understanding, and voiced  agreement with the above plans.  All questions were answered to her satisfaction. she is encouraged to contact clinic should she have any questions or concerns prior to her return visit.   Follow up plan: Return in about 4 months (around 04/05/2021) for F/U with Pre-visit Labs.   Thank you for involving me in the care of this pleasant patient, and I will continue to update you with her progress.  Glade Lloyd, MD [facility] End[facility] Phone: [phone]  Fax: [phone]   12/03/2020, 2:37 PM  This note was partially dictated with voice recognition software. Similar sounding words can be transcribed inadequately or may not  be corrected upon review.

## 2020-12-11 NOTE — Patient Instructions (Signed)
Melissa Schmidt  12/11/2020     @PREFPERIOPPHARMACY @   Your procedure is scheduled on  12/18/2020.   Report to [facility] at  [phone]  A.M.   Call this number if you have problems the morning of surgery:  [phone]   Remember:  Follow the diet and prep instructions given to you by the office.       DO NOT take any medications for diabetes the morning of your procedure.    Use your inhaler before you come and bring your rescue inhaler with you.   Take these medicines the morning of surgery with A SIP OF WATER                      levothyroxine, protonix.    Do not wear jewelry, make-up or nail polish.  Do not wear lotions, powders, or perfumes, or deodorant.  Do not shave 48 hours prior to surgery.  Men may shave face and neck.  Do not bring valuables to the hospital.  [facility] is not responsible for any belongings or valuables.  Contacts, dentures or bridgework may not be worn into surgery.  Leave your suitcase in the car.  After surgery it may be brought to your room.  For patients admitted to the hospital, discharge time will be determined by your treatment team.  Patients discharged the day of surgery will not be allowed to drive home and must have someone with them for 24 hours.    Special instructions:  DO NOT smoke tobacco or vape for 24 hours before your procedure.  Please read over the following fact sheets that you were given. Anesthesia Post-op Instructions and Care and Recovery After Surgery      Upper Endoscopy, Adult, Care After This sheet gives you information about how to care for yourself after your procedure. Your health care provider may also give you more specific instructions. If you have problems or questions, contact your health care provider. What can I expect after the procedure? After the procedure, it is common to have: A sore throat. Mild stomach pain or discomfort. Bloating. Nausea. Follow these instructions at  home:  Follow instructions from your health care provider about what to eat or drink after your procedure. Return to your normal activities as told by your health care provider. Ask your health care provider what activities are safe for you. Take over-the-counter and prescription medicines only as told by your health care provider. If you were given a sedative during the procedure, it can affect you for several hours. Do not drive or operate machinery until your health care provider says that it is safe. Keep all follow-up visits as told by your health care provider. This is important. Contact a health care provider if you have: A sore throat that lasts longer than one day. Trouble swallowing. Get help right away if: You vomit blood or your vomit looks like coffee grounds. You have: A fever. Bloody, black, or tarry stools. A severe sore throat or you cannot swallow. Difficulty breathing. Severe pain in your chest or abdomen. Summary After the procedure, it is common to have a sore throat, mild stomach discomfort, bloating, and nausea. If you were given a sedative during the procedure, it can affect you for several hours. Do not drive or operate machinery until your health care provider says that it is safe. Follow instructions from your health care provider about what to eat or  drink after your procedure. Return to your normal activities as told by your health care provider. This information is not intended to replace advice given to you by your health care provider. Make sure you discuss any questions you have with your health care provider. Document Revised: 11/30/2018 Document Reviewed: 06/26/2017 Elsevier Patient Education  2022 [place]. Colonoscopy, Adult, Care After This sheet gives you information about how to care for yourself after your procedure. Your health care provider may also give you more specific instructions. If you have problems or questions, contact your health  care provider. What can I expect after the procedure? After the procedure, it is common to have: A small amount of blood in your stool for 24 hours after the procedure. Some gas. Mild cramping or bloating of your abdomen. Follow these instructions at home: Eating and drinking  Drink enough fluid to keep your urine pale yellow. Follow instructions from your health care provider about eating or drinking restrictions. Resume your normal diet as instructed by your health care provider. Avoid heavy or fried foods that are hard to digest. Activity Rest as told by your health care provider. Avoid sitting for a long time without moving. Get up to take short walks every 1-2 hours. This is important to improve blood flow and breathing. Ask for help if you feel weak or unsteady. Return to your normal activities as told by your health care provider. Ask your health care provider what activities are safe for you. Managing cramping and bloating  Try walking around when you have cramps or feel bloated. Apply heat to your abdomen as told by your health care provider. Use the heat source that your health care provider recommends, such as a moist heat pack or a heating pad. Place a towel between your skin and the heat source. Leave the heat on for 20-30 minutes. Remove the heat if your skin turns bright red. This is especially important if you are unable to feel pain, heat, or cold. You may have a greater risk of getting burned. General instructions If you were given a sedative during the procedure, it can affect you for several hours. Do not drive or operate machinery until your health care provider says that it is safe. For the first 24 hours after the procedure: Do not sign important documents. Do not drink alcohol. Do your regular daily activities at a slower pace than normal. Eat soft foods that are easy to digest. Take over-the-counter and prescription medicines only as told by your health care  provider. Keep all follow-up visits as told by your health care provider. This is important. Contact a health care provider if: You have blood in your stool 2-3 days after the procedure. Get help right away if you have: More than a small spotting of blood in your stool. Large blood clots in your stool. Swelling of your abdomen. Nausea or vomiting. A fever. Increasing pain in your abdomen that is not relieved with medicine. Summary After the procedure, it is common to have a small amount of blood in your stool. You may also have mild cramping and bloating of your abdomen. If you were given a sedative during the procedure, it can affect you for several hours. Do not drive or operate machinery until your health care provider says that it is safe. Get help right away if you have a lot of blood in your stool, nausea or vomiting, a fever, or increased pain in your abdomen. This information is not intended to  replace advice given to you by your health care provider. Make sure you discuss any questions you have with your health care provider. Document Revised: 11/30/2018 Document Reviewed: 08/20/2018 Elsevier Patient Education  [place] After This sheet gives you information about how to care for yourself after your procedure. Your health care provider may also give you more specific instructions. If you have problems or questions, contact your health care provider. What can I expect after the procedure? After the procedure, it is common to have: Tiredness. Forgetfulness about what happened after the procedure. Impaired judgment for important decisions. Nausea or vomiting. Some difficulty with balance. Follow these instructions at home: For the time period you were told by your health care provider:   Rest as needed. Do not participate in activities where you could fall or become injured. Do not drive or use machinery. Do not drink alcohol. Do not  take sleeping pills or medicines that cause drowsiness. Do not make important decisions or sign legal documents. Do not take care of children on your own. Eating and drinking Follow the diet that is recommended by your health care provider. Drink enough fluid to keep your urine pale yellow. If you vomit: Drink water, juice, or soup when you can drink without vomiting. Make sure you have little or no nausea before eating solid foods. General instructions Have a responsible adult stay with you for the time you are told. It is important to have someone help care for you until you are awake and alert. Take over-the-counter and prescription medicines only as told by your health care provider. If you have sleep apnea, surgery and certain medicines can increase your risk for breathing problems. Follow instructions from your health care provider about wearing your sleep device: Anytime you are sleeping, including during daytime naps. While taking prescription pain medicines, sleeping medicines, or medicines that make you drowsy. Avoid smoking. Keep all follow-up visits as told by your health care provider. This is important. Contact a health care provider if: You keep feeling nauseous or you keep vomiting. You feel light-headed. You are still sleepy or having trouble with balance after 24 hours. You develop a rash. You have a fever. You have redness or swelling around the IV site. Get help right away if: You have trouble breathing. You have new-onset confusion at home. Summary For several hours after your procedure, you may feel tired. You may also be forgetful and have poor judgment. Have a responsible adult stay with you for the time you are told. It is important to have someone help care for you until you are awake and alert. Rest as told. Do not drive or operate machinery. Do not drink alcohol or take sleeping pills. Get help right away if you have trouble breathing, or if you suddenly  become confused. This information is not intended to replace advice given to you by your health care provider. Make sure you discuss any questions you have with your health care provider. Document Revised: 10/10/2019 Document Reviewed: 12/27/2018 Elsevier Patient Education  2022 [employer].

## 2020-12-15 ENCOUNTER — Encounter (HOSPITAL_COMMUNITY)
Admission: RE | Admit: 2020-12-15 | Discharge: 2020-12-15 | Disposition: A | Discharge status: Home / Self Care | Payer: BC Managed Care – PPO | Source: Ambulatory Visit | Attending: Internal Medicine | Admitting: Internal Medicine

## 2020-12-15 ENCOUNTER — Telehealth: Payer: Self-pay | Admitting: Internal Medicine

## 2020-12-15 DIAGNOSIS — E119 Type 2 diabetes mellitus without complications: Secondary | ICD-10-CM

## 2020-12-15 DIAGNOSIS — Z01818 Encounter for other preprocedural examination: Secondary | ICD-10-CM

## 2020-12-15 NOTE — Telephone Encounter (Signed)
Spoke to pt, she doesn't have anyone to watch her granddaughter tomorrow so she can go to pre-op appt. She called to see if they had any other pre-op appts available but was told they were full. Informed pt I will cancel TCS/EGD scheduled for 12/18/20. No available openings for ASA 3 TCS/EGD for December. She has upcoming OV scheduled with Roseanne Kaufman NP. Vicente Males can give new orders for procedure when pt is seen since last visit was video 08/2020. She has also previously rescheduled procedure.

## 2020-12-15 NOTE — Telephone Encounter (Signed)
Patient needs to reschedule her procedure, she has no one to watch her grandchild for her pre op visit

## 2020-12-16 ENCOUNTER — Encounter (HOSPITAL_COMMUNITY): Payer: Self-pay

## 2020-12-16 ENCOUNTER — Other Ambulatory Visit (HOSPITAL_COMMUNITY): Payer: BC Managed Care – PPO

## 2020-12-16 ENCOUNTER — Encounter (HOSPITAL_COMMUNITY): Payer: BC Managed Care – PPO

## 2020-12-16 NOTE — Telephone Encounter (Signed)
I'm sorry to hear this. I guess there is no way her granddaughter could come with her in the future to the appointment?

## 2020-12-18 ENCOUNTER — Encounter (HOSPITAL_COMMUNITY): Admission: RE | Payer: Self-pay | Source: Home / Self Care

## 2020-12-18 ENCOUNTER — Ambulatory Visit (HOSPITAL_COMMUNITY)
Admission: RE | Admit: 2020-12-18 | Payer: BC Managed Care – PPO | Source: Home / Self Care | Admitting: Internal Medicine

## 2020-12-18 SURGERY — COLONOSCOPY WITH PROPOFOL
Anesthesia: Monitor Anesthesia Care

## 2020-12-24 ENCOUNTER — Ambulatory Visit: Payer: BC Managed Care – PPO | Admitting: Urology

## 2020-12-28 NOTE — Progress Notes (Deleted)
45 year old female presenting today with history of celiac disease diagnosed in 2017, chronic GERD, fatty liver, intermittent RUQ, chronic IDA with worsening iron deficiency recently and requiring IV iron. Followed by Hematology [facility]). Last colonoscopy in 2017 without polyps. Last EGD in [place] 2019. DEXA scan on file from Nov 2020 without osteopenia. Known Vit D deficiency and improved on VIt D supplementation.    RUQ Pain: HIDA completed and negative.. EGD recommended.   IDA worsened requiring IV iron. Now improved s/p iron. recommend colonoscopy at time of EGD.   Needed celiac labs completed.

## 2020-12-29 ENCOUNTER — Ambulatory Visit: Payer: BC Managed Care – PPO | Admitting: Gastroenterology

## 2020-12-30 ENCOUNTER — Telehealth: Payer: Self-pay

## 2020-12-30 NOTE — Telephone Encounter (Signed)
VOB submitted for SynviscOne, right knee. BV pending.

## 2021-01-04 ENCOUNTER — Telehealth: Payer: Self-pay

## 2021-01-04 NOTE — Telephone Encounter (Signed)
Called and left a Vm for patient to CB to schedule for gel injection with Dr. Erlinda Hong.  Approved for SynviscOne, right knee. [place] Patient will be responsible for 20% OOP. No Co-pay No PA required

## 2021-01-12 ENCOUNTER — Encounter: Payer: Self-pay | Admitting: Orthopaedic Surgery

## 2021-01-12 ENCOUNTER — Ambulatory Visit (INDEPENDENT_AMBULATORY_CARE_PROVIDER_SITE_OTHER): Payer: BC Managed Care – PPO | Admitting: Orthopaedic Surgery

## 2021-01-12 ENCOUNTER — Other Ambulatory Visit: Payer: Self-pay

## 2021-01-12 DIAGNOSIS — M1711 Unilateral primary osteoarthritis, right knee: Secondary | ICD-10-CM | POA: Diagnosis not present

## 2021-01-12 MED ORDER — BUPIVACAINE HCL 0.25 % IJ SOLN
2.0000 mL | INTRAMUSCULAR | Status: AC | PRN
Start: 2021-01-12 — End: 2021-01-12
  Administered 2021-01-12: 2 mL via INTRA_ARTICULAR

## 2021-01-12 MED ORDER — LIDOCAINE HCL 1 % IJ SOLN
2.0000 mL | INTRAMUSCULAR | Status: AC | PRN
Start: 2021-01-12 — End: 2021-01-12
  Administered 2021-01-12: 2 mL

## 2021-01-12 MED ORDER — HYLAN G-F 20 48 MG/6ML IX SOSY
48.0000 mg | PREFILLED_SYRINGE | INTRA_ARTICULAR | Status: AC | PRN
  Administered 2021-01-12: 48 mg via INTRA_ARTICULAR

## 2021-01-12 NOTE — Progress Notes (Signed)
Office Visit Note   Patient: Melissa Schmidt           Date of Birth: March 15, 1975           MRN: [id number] Visit Date: 01/12/2021              Requested by: Andres Shad, MD [address] [place] [address] PCP: Andres Shad, MD   Assessment & Plan: Visit Diagnoses:  1. Unilateral primary osteoarthritis, right knee     Plan: Impression is right knee osteoarthritis.  Today, proceed with Synvisc 1 injections right knee which she tolerated well.  She will follow-up with [place] as needed.  Follow-Up Instructions: Return if symptoms worsen or fail to improve.   Orders:  Orders Placed This Encounter  Procedures   Large Joint Inj: R knee   No orders of the defined types were placed in this encounter.     Procedures: Large Joint Inj: R knee on 01/12/2021 2:29 PM Indications: pain Details: 22 G needle, anterolateral approach Medications: 2 mL lidocaine 1 %; 2 mL bupivacaine 0.25 %; 48 mg Hylan 48 MG/6ML     Clinical Data: No additional findings.   Subjective: Chief Complaint  Patient presents with   Right Knee - Pain    HPI patient is a pleasant 45 year old female with underlying right knee osteoarthritis who comes in today for right knee Synvisc 1 injection.  She has not previously undergone viscosupplementation injection to the right knee.     Objective: Vital Signs: There were no vitals taken for this visit.    Ortho Exam unchanged right knee exam  Specialty Comments:  No specialty comments available.  Imaging: No new imaging   PMFS History: Patient Active Problem List   Diagnosis Date Noted   IDA (iron deficiency anemia) 08/20/2020   Vitamin D deficiency 08/20/2020   Type 2 diabetes mellitus without complication, without long-term current use of insulin ([place]) 07/21/2020   Iron deficiency anemia 12/31/2019   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   Hemorrhoids 12/11/2018   Graves' disease 11/27/2018   RUQ  abdominal pain 10/10/2018   Celiac disease 10/10/2018   GERD (gastroesophageal reflux disease) 10/10/2018   Hyperthyroidism 10/10/2017   Uncontrolled type 2 diabetes mellitus with hyperglycemia ([place]) 10/10/2017   Mucosal abnormality of stomach    Dysphagia    Positive autoantibody screening for celiac disease 04/29/2015   Fatty liver 85/88/5027   Colicky RUQ abdominal pain 03/19/2015   RLQ abdominal pain 03/19/2015   Rectal bleeding 03/19/2015   Past Medical History:  Diagnosis Date   Arthritis    Borderline diabetes    Celiac disease    Complication of anesthesia    shaking post anesthesia   Diabetes mellitus without complication (HCC)    Hemorrhoids    Hypertriglyceridemia    PCOS (polycystic ovarian syndrome)     Family History  Problem Relation Age of Onset   Irritable bowel syndrome Mother    Colon polyps Mother        ? 5 year surveillance   Inflammatory bowel disease Neg Hx    Colon cancer Neg Hx    Liver disease Neg Hx     Past Surgical History:  Procedure Laterality Date   APPENDECTOMY  2002   COLONOSCOPY WITH PROPOFOL N/A 05/25/2015   Dr.Rourk- non-bleeding hemorrhoids, the examination was o/w normal   ESOPHAGEAL DILATION N/A 05/25/2015   Procedure: ESOPHAGEAL DILATION;  Surgeon: Daneil Dolin, MD;  [facility];  Service: Endoscopy;  Laterality: N/A;   ESOPHAGOGASTRODUODENOSCOPY  04/2017   [place]: EGD with irregular Z-line s/p biopsy, benign-appearing esophageal stenosis s/p dilation, normal examined duodenum but no biopsies of duodenum. Pathology with junctional type GE mucosa with chronic inflammation and surface erosion, no metaplasia, negative dysplasia.    ESOPHAGOGASTRODUODENOSCOPY (EGD) WITH PROPOFOL N/A 05/25/2015   Dr.Rourk- normal esophagus, dilated, gastric erosions, intact fundoplication, normal third portion of the duodenum, non-bleeding erosive gastropathy. duodenum bx= benign small bowel mucosa with patchy increased intraepithelial  lymphocytes, mild villous blunting and mild crypt hyperplasia, no dysplasia or malignancy stomach bx= erosive gastritis with reactive changes   NISSEN FUNDOPLICATION  1224   Social History   Occupational History   Occupation: in school  Tobacco Use   Smoking status: Never   Smokeless tobacco: Never  Vaping Use   Vaping Use: Never used  Substance and Sexual Activity   Alcohol use: No    Alcohol/week: 0.0 standard drinks   Drug use: No   Sexual activity: Yes    Birth control/protection: None

## 2021-04-03 LAB — T4, FREE: Free T4: 1.76 ng/dL (ref 0.82–1.77)

## 2021-04-03 LAB — TSH: TSH: 5.38 u[IU]/mL — ABNORMAL HIGH (ref 0.450–4.500)

## 2021-04-06 ENCOUNTER — Encounter: Payer: Self-pay | Admitting: "Endocrinology

## 2021-04-06 ENCOUNTER — Ambulatory Visit: Payer: BC Managed Care – PPO | Admitting: "Endocrinology

## 2021-04-06 ENCOUNTER — Ambulatory Visit (INDEPENDENT_AMBULATORY_CARE_PROVIDER_SITE_OTHER): Payer: BC Managed Care – PPO | Admitting: "Endocrinology

## 2021-04-06 VITALS — BP 114/79 | HR 85 | Ht 64.0 in | Wt 292.0 lb

## 2021-04-06 DIAGNOSIS — E559 Vitamin D deficiency, unspecified: Secondary | ICD-10-CM | POA: Diagnosis not present

## 2021-04-06 DIAGNOSIS — E119 Type 2 diabetes mellitus without complications: Secondary | ICD-10-CM | POA: Diagnosis not present

## 2021-04-06 DIAGNOSIS — E890 Postprocedural hypothyroidism: Secondary | ICD-10-CM | POA: Diagnosis not present

## 2021-04-06 MED ORDER — LEVOTHYROXINE SODIUM 200 MCG PO TABS: 200.0000 ug | ORAL_TABLET | Freq: Every day | ORAL | 1 refills | Status: DC

## 2021-04-06 NOTE — Progress Notes (Signed)
04/06/2021           Endocrinology follow-up note  Subjective:    Patient ID: Melissa Schmidt, female    DOB: 01-09-1976, PCP Andres Shad, MD.   Past Medical History:  Diagnosis Date   Arthritis    Borderline diabetes    Celiac disease    Complication of anesthesia    shaking post anesthesia   Diabetes mellitus without complication ([place])    Hemorrhoids    Hypertriglyceridemia    PCOS (polycystic ovarian syndrome)    Past Surgical History:  Procedure Laterality Date   APPENDECTOMY  2002   COLONOSCOPY WITH PROPOFOL N/A 05/25/2015   Dr.Rourk- non-bleeding hemorrhoids, the examination was o/w normal   ESOPHAGEAL DILATION N/A 05/25/2015   Procedure: ESOPHAGEAL DILATION;  Surgeon: Daneil Dolin, MD;  [facility];  Service: Endoscopy;  Laterality: N/A;   ESOPHAGOGASTRODUODENOSCOPY  04/2017   [place]: EGD with irregular Z-line s/p biopsy, benign-appearing esophageal stenosis s/p dilation, normal examined duodenum but no biopsies of duodenum. Pathology with junctional type GE mucosa with chronic inflammation and surface erosion, no metaplasia, negative dysplasia.    ESOPHAGOGASTRODUODENOSCOPY (EGD) WITH PROPOFOL N/A 05/25/2015   Dr.Rourk- normal esophagus, dilated, gastric erosions, intact fundoplication, normal third portion of the duodenum, non-bleeding erosive gastropathy. duodenum bx= benign small bowel mucosa with patchy increased intraepithelial lymphocytes, mild villous blunting and mild crypt hyperplasia, no dysplasia or malignancy stomach bx= erosive gastritis with reactive changes   NISSEN FUNDOPLICATION  8416   Social History   Socioeconomic History   Marital status: Married    Spouse name: Not on file   Number of children: 1   Years of education: Not on file   Highest education level: Not on file  Occupational History   Occupation: in school  Tobacco Use   Smoking status: Never   Smokeless tobacco: Never  Vaping Use   Vaping Use: Never used   Substance and Sexual Activity   Alcohol use: No    Alcohol/week: 0.0 standard drinks   Drug use: No   Sexual activity: Yes    Birth control/protection: None  Other Topics Concern   Not on file  Social History Narrative   Not on file   Social Determinants of Health   Financial Resource Strain: Not on file  Food Insecurity: Not on file  Transportation Needs: Not on file  Physical Activity: Not on file  Stress: Not on file  Social Connections: Not on file   Outpatient Encounter Medications as of 04/06/2021  Medication Sig   albuterol (VENTOLIN HFA) 108 (90 Base) MCG/ACT inhaler Inhale 2 puffs into the lungs every 6 (six) hours as needed for shortness of breath.   Ascorbic Acid (VITAMIN C ADULT GUMMIES PO) Take 2 tablets by mouth daily.   BREO ELLIPTA 200-25 MCG/INH AEPB Inhale 1 puff into the lungs daily.   Cholecalciferol (VITAMIN D3) 125 MCG (5000 UT) CAPS Take 1 capsule (5,000 Units total) by mouth daily.   ferrous sulfate 325 (65 FE) MG EC tablet Take 325 mg by mouth daily with breakfast.   fluticasone (FLONASE) 50 MCG/ACT nasal spray Place 2 sprays into both nostrils daily as needed for allergies.   glimepiride (AMARYL) 1 MG tablet Take 1 mg by mouth daily.   hydrocortisone (ANUSOL-HC) 2.5 % rectal cream Place 1 application rectally 2 (two) times daily. (Patient taking differently: Place 1 application rectally as needed.)   metFORMIN (GLUCOPHAGE) 1000 MG tablet Take 1,000 mg by mouth 2 (two) times daily  with a meal.    Multiple Vitamin (MULTIVITAMIN) capsule Take 1 capsule by mouth daily.   pantoprazole (PROTONIX) 40 MG tablet TAKE 1 TABLET BY MOUTH ONCE DAILY BEFORE BREAKFAST   polyethylene glycol-electrolytes (TRILYTE) 420 g solution Take 4,000 mLs by mouth as directed.   [DISCONTINUED] levothyroxine (SYNTHROID) 175 MCG tablet Take 1 tablet (175 mcg total) by mouth daily before breakfast.   levothyroxine (SYNTHROID) 200 MCG tablet Take 1 tablet (200 mcg total) by mouth daily  before breakfast.   methocarbamol (ROBAXIN) 500 MG tablet Take 1 tablet (500 mg total) by mouth 2 (two) times daily as needed. (Patient not taking: Reported on 12/10/2020)   No facility-administered encounter medications on file as of 04/06/2021.    ALLERGIES: Allergies  Allergen Reactions   Singulair [Montelukast]     VACCINATION STATUS:  There is no immunization history on file for this patient.   HPI  Melissa Schmidt is 46 y.o. 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  First treatment was administered in November 2020, with inadequate response.  She was treated again in January 2022.  She was initiated on levothyroxine for RAI induced hypothyroidism during her prior visits.  Her previsit labs are consistent with improved however still inadequate replacement.  She is currently on levothyroxine 175 mcg p.o. daily daily before breakfast.    -Prior to treatment,  her thyroid uptake showed 68% in 24 hours  suggesting Graves' disease.  -She presents with improvement of her previous symptoms including fatigue.she has lost some weight since last visit.   She also has type 2 diabetes, currently on metformin 1000 mg p.o. twice daily.  Her most recent A1c was 6.5%.    she denies family history of thyroid dysfunction nor any thyroid cancer.                            Review of systems  Limited as above  Objective:    BP 114/79    Pulse 85    Ht 5' 4"  (1.626 m)    Wt 292 lb (132.5 kg)    SpO2 95%    BMI 50.12 kg/m   Wt Readings from Last 3 Encounters:  04/06/21 292 lb (132.5 kg)  12/03/20 (!) 311 lb (141.1 kg)  09/08/20 (!) 317 lb (143.8 kg)                   Component Value Date/Time   NA 140 11/05/2018 1551   K 4.6 11/05/2018 1551   CL 103 11/05/2018 1551   CO2 25 11/05/2018 1551   GLUCOSE 55 (L) 11/05/2018 1551   GLUCOSE 163 (H) 05/21/2015 1010   BUN 12 04/15/2020 0000   CREATININE 0.9 04/15/2020 0000    CREATININE 0.71 11/05/2018 1551   CALCIUM 9.1 04/15/2020 0000   PROT 6.8 12/17/2019 1106   PROT 6.8 11/05/2018 1551   ALBUMIN 4.2 11/05/2018 1551   AST 15 12/17/2019 1106   ALT 16 12/17/2019 1106   ALKPHOS 89 11/05/2018 1551   BILITOT 0.3 12/17/2019 1106   BILITOT <0.2 11/05/2018 1551   GFRNONAA 81 04/15/2020 0000   GFRAA 131 11/22/2019 0000     CBC    Component Value Date/Time   WBC 11.9 (H) 12/06/2019 1426   WBC 9.6 05/21/2015 1010   RBC 5.13 12/06/2019 1426   RBC 4.41 05/21/2015 1010   HGB 11.5 12/06/2019 1426  HCT 36.6 12/06/2019 1426   PLT 452 (H) 12/06/2019 1426   MCV 71 (L) 12/06/2019 1426   MCH 22.4 (L) 12/06/2019 1426   MCH 25.4 (L) 05/21/2015 1010   MCHC 31.4 (L) 12/06/2019 1426   MCHC 31.5 05/21/2015 1010   RDW 15.4 12/06/2019 1426   LYMPHSABS 3.6 (H) 12/06/2019 1426   EOSABS 0.2 12/06/2019 1426   BASOSABS 0.0 12/06/2019 1426     Diabetic Labs (most recent): Lab Results  Component Value Date   HGBA1C 6.8 12/03/2020   HGBA1C 6.1 04/15/2020   HGBA1C 6.2 11/22/2019    Lipid Panel     Component Value Date/Time   CHOL 224 (A) 04/15/2020 0000   TRIG 192 (A) 04/15/2020 0000   HDL 45 04/15/2020 0000   LDLCALC 144 04/15/2020 0000     Thyroid uptake and scan on October 20, 20 FINDINGS: Homogeneous tracer accumulation within both thyroid lobes, diffusely increased.   No focal areas of increased or decreased tracer localization.  4 hour I-123 uptake = 57% (normal 5-20%)  24 hour I-123 uptake = 68% (normal 10-30%)   IMPRESSION: Diffusely increased homogeneous tracer accumulation in both thyroid lobes.   Increased 4 hour and 24 hour radio iodine uptakes consistent with hyperthyroidism.  Findings consistent with Graves disease.  I-131 thyroid ablation on December 12, 2018  Her response was only partial. Repeat thyroid uptake and scan on January 16, 2020 FINDINGS: Homogeneous tracer distribution in both thyroid lobes.  No focal areas of  increased or decreased tracer localization.   4 hour I-123 uptake = 27.7% (normal 5-20%),  24 hour I-123 uptake = 43.7% (normal 10-30%)   IMPRESSION: Normal thyroid scan.  Mildly elevated 4 hour and 24 hour radio iodine uptakes.  Findings consistent with hyperthyroidism and Graves disease.   4 hour and 24 hour radio iodine uptakes have decreased since the previous exam.    Recent Results (from the past 2160 hour(s))  TSH     Status: Abnormal   Collection Time: 04/02/21  2:55 PM  Result Value Ref Range   TSH 5.380 (H) 0.450 - 4.500 uIU/mL  T4, free     Status: None   Collection Time: 04/02/21  2:55 PM  Result Value Ref Range   Free T4 1.76 0.82 - 1.77 ng/dL   On February 21, 2020 she received second dose of RAI thyroid ablative therapy: Patient then received the radiopharmaceutical without immediate complication.   RADIOPHARMACEUTICALS:  25.8 mCi I-131 sodium iodide orally   IMPRESSION: Per oral administration of I-131 sodium iodide for the treatment of recurrent hyperthyroidism.  Assessment & Plan:   1.  RAI induced hypothyroidism  2. Graves' disease-resolved 3.  Type 2 diabetes 4.  Vitamin D deficiency 5.  Morbid obesity  -She  has responded to the second treatment with RAI with clinical and biochemical hypothyroidism.    Her previsit labs are consistent with inadequate replacement.  I discussed and increase her levothyroxine to 200 mcg p.o. daily before breakfast.     - We discussed about the correct intake of her thyroid hormone, on empty stomach at fasting, with water, separated by at least 30 minutes from breakfast and other medications,  and separated by more than 4 hours from calcium, iron, multivitamins, acid reflux medications (PPIs). -Patient is made aware of the fact that thyroid hormone replacement is needed for life, dose to be adjusted by periodic monitoring of thyroid function tests.   Related to her metabolic syndrome involving obesity, type 2  diabetes,  she will benefit the most from lifestyle medicine.  - she acknowledges that there is a room for improvement in her food and drink choices. - Suggestion is made for her to avoid simple carbohydrates  from her diet including Cakes, Sweet Desserts, Ice Cream, Soda (diet and regular), Sweet Tea, Candies, Chips, Cookies, Store Bought Juices, Alcohol , Artificial Sweeteners,  Coffee Creamer, and "Sugar-free" Products, Lemonade. This will help patient to have more stable blood glucose profile and potentially avoid unintended weight gain.  The following Lifestyle Medicine recommendations according to [place]  [facility]) were discussed and and offered to patient and she  agrees to start the journey:  A. Whole Foods, Plant-Based Nutrition comprising of fruits and vegetables, plant-based proteins, whole-grain carbohydrates was discussed in detail with the patient.   A list for source of those nutrients were also provided to the patient.  Patient will use only water or unsweetened tea for hydration. B.  The need to stay away from risky substances including alcohol, smoking; obtaining 7 to 9 hours of restorative sleep, at least 150 minutes of moderate intensity exercise weekly, the importance of healthy social connections,  and stress management techniques were discussed. C.  A full color page of  Calorie density of various food groups per pound showing examples of each food groups was provided to the patient.  She is advised to continue metformin 1000 mg p.o. twice daily, glimepiride 1 mg p.o. daily at breakfast.    For her vitamin D deficiency: She is on ongoing treatment with vitamin D3 5000 units daily.     - I advised her to maintain close follow up with Andres Shad, MD for primary care needs.    I spent 32 minutes in the care of the patient today including review of labs from Thyroid Function, CMP, and other relevant labs ; imaging/biopsy records (current  and previous including abstractions from other facilities); face-to-face time discussing  her lab results and symptoms, medications doses, her options of short and long term treatment based on the latest standards of care / guidelines;   and documenting the encounter.  Melissa Schmidt  participated in the discussions, expressed understanding, and voiced agreement with the above plans.  All questions were answered to her satisfaction. she is encouraged to contact clinic should she have any questions or concerns prior to her return visit.    Follow up plan: Return in about 6 months (around 10/04/2021) for F/U with Pre-visit Labs, A1c -NV.   Thank you for involving me in the care of this pleasant patient, and I will continue to update you with her progress.  Glade Lloyd, MD [facility] Phone: [phone]  Fax: [phone]   04/06/2021, 4:06 PM  This note was partially dictated with voice recognition software. Similar sounding words can be transcribed inadequately or may not  be corrected upon review.

## 2021-04-06 NOTE — Patient Instructions (Signed)

## 2021-05-11 ENCOUNTER — Telehealth (INDEPENDENT_AMBULATORY_CARE_PROVIDER_SITE_OTHER): Payer: BC Managed Care – PPO | Admitting: Gastroenterology

## 2021-05-11 ENCOUNTER — Other Ambulatory Visit: Payer: Self-pay

## 2021-05-11 ENCOUNTER — Telehealth: Payer: Self-pay | Admitting: Gastroenterology

## 2021-05-11 VITALS — Ht 63.0 in | Wt 292.0 lb

## 2021-05-11 DIAGNOSIS — E890 Postprocedural hypothyroidism: Secondary | ICD-10-CM

## 2021-05-11 DIAGNOSIS — K760 Fatty (change of) liver, not elsewhere classified: Secondary | ICD-10-CM

## 2021-05-11 DIAGNOSIS — K900 Celiac disease: Secondary | ICD-10-CM

## 2021-05-11 DIAGNOSIS — D509 Iron deficiency anemia, unspecified: Secondary | ICD-10-CM

## 2021-05-11 DIAGNOSIS — E559 Vitamin D deficiency, unspecified: Secondary | ICD-10-CM | POA: Diagnosis not present

## 2021-05-11 NOTE — Progress Notes (Signed)
? ? ?Primary Care Physician:  Andres Shad, MD  ?Primary GI: Dr Gala Romney  ? ?Patient Location: Home ?  ?Provider Location: [place] office ?  ?Reason for Visit: Follow up  ?  ?Persons present on the virtual encounter, with roles: Patient and NP ?  ?Total time (minutes) spent on medical discussion: 10 minutes ?  ?Due to COVID-19, visit was conducted using virtual method.  Visit was requested by patient. ? ?Virtual Visit via MyChart Video Note ?Due to COVID-19, visit is conducted virtually and was requested by patient.  ? ?I connected with Melissa Schmidt on 05/11/21 at  1:30 PM EDT by video and verified that I am speaking with the correct person using two identifiers. ?  ?I discussed the limitations, risks, security and privacy concerns of performing an evaluation and management service by video and the availability of in person appointments. I also discussed with the patient that there may be a patient responsible charge related to this service. The patient expressed understanding and agreed to proceed. ? ?Chief Complaint  ?Patient presents with  ? Follow-up  ?  Celiac disease. Pt states she hasn't been in here in a while. Colonoscopy/EGD consult  ? ? ? ?History of Present Illness: ?Melissa Schmidt is a 46 year old female presenting today with history of celiac disease diagnosed in 2017, chronic GERD, fatty liver, intermittent RUQ, chronic IDA with worsening iron deficiency last year requiring IV iron. Last colonoscopy in 2017 overall normal.  ? ?RUQ pain: gallblader present. [place] without gallstones. Known fatty liver. HIDA normal. She notes improvement with this. Stricter with gluten-free diet. Pain is rare now.  ? ?Notes early satiety. A1c 6.5 per her report recently. No dysphagia. PPI daily. Wants to monitor this for now.  ? ?She was to have updated colonoscopy/EGD due to worsening IDA last year. IDA has resolved. Received 2 iron infusions last year. Does admit to not adhering to gluten-free diet at that time. Upcoming  appt with Hematology. No overt GI bleeding. No menses.  ? ?Due for DEXA now.   ?  ? ?Past Medical History:  ?Diagnosis Date  ? Arthritis   ? Borderline diabetes   ? Celiac disease   ? Complication of anesthesia   ? shaking post anesthesia  ? Diabetes mellitus without complication ([place])   ? Hemorrhoids   ? Hypertriglyceridemia   ? PCOS (polycystic ovarian syndrome)   ? ? ? ?Past Surgical History:  ?Procedure Laterality Date  ? APPENDECTOMY  2002  ? COLONOSCOPY WITH PROPOFOL N/A 05/25/2015  ? Dr.Rourk- non-bleeding hemorrhoids, the examination was o/w normal  ? ESOPHAGEAL DILATION N/A 05/25/2015  ? Procedure: ESOPHAGEAL DILATION;  Surgeon: Daneil Dolin, MD;  Location: [facility];  Service: Endoscopy;  Laterality: N/A;  ? ESOPHAGOGASTRODUODENOSCOPY  04/2017  ? [place]: EGD with irregular Z-line s/p biopsy, benign-appearing esophageal stenosis s/p dilation, normal examined duodenum but no biopsies of duodenum. Pathology with junctional type GE mucosa with chronic inflammation and surface erosion, no metaplasia, negative dysplasia.   ? ESOPHAGOGASTRODUODENOSCOPY (EGD) WITH PROPOFOL N/A 05/25/2015  ? Dr.Rourk- normal esophagus, dilated, gastric erosions, intact fundoplication, normal third portion of the duodenum, non-bleeding erosive gastropathy. duodenum bx= benign small bowel mucosa with patchy increased intraepithelial lymphocytes, mild villous blunting and mild crypt hyperplasia, no dysplasia or malignancy stomach bx= erosive gastritis with reactive changes  ? NISSEN FUNDOPLICATION  3762  ? ? ? ?Current Meds  ?Medication Sig  ? albuterol (VENTOLIN HFA) 108 (90 Base) MCG/ACT inhaler Inhale 2 puffs into  the lungs every 6 (six) hours as needed for shortness of breath.  ? Ascorbic Acid (VITAMIN C ADULT GUMMIES PO) Take 2 tablets by mouth daily.  ? BREO ELLIPTA 200-25 MCG/INH AEPB Inhale 1 puff into the lungs daily.  ? Cholecalciferol (VITAMIN D3) 125 MCG (5000 UT) CAPS Take 1 capsule (5,000 Units total) by mouth  daily.  ? ferrous sulfate 325 (65 FE) MG EC tablet Take 325 mg by mouth daily with breakfast.  ? fluticasone (FLONASE) 50 MCG/ACT nasal spray Place 2 sprays into both nostrils daily as needed for allergies.  ? glimepiride (AMARYL) 1 MG tablet Take 1 mg by mouth daily.  ? hydrocortisone (ANUSOL-HC) 2.5 % rectal cream Place 1 application rectally 2 (two) times daily. (Patient taking differently: Place 1 application. rectally as needed.)  ? levothyroxine (SYNTHROID) 200 MCG tablet Take 1 tablet (200 mcg total) by mouth daily before breakfast.  ? metFORMIN (GLUCOPHAGE) 1000 MG tablet Take 1,000 mg by mouth 2 (two) times daily with a meal.   ? Multiple Vitamin (MULTIVITAMIN) capsule Take 1 capsule by mouth daily.  ? pantoprazole (PROTONIX) 40 MG tablet TAKE 1 TABLET BY MOUTH ONCE DAILY BEFORE BREAKFAST  ?  ? ?Family History  ?Problem Relation Age of Onset  ? Irritable bowel syndrome Mother   ? Colon polyps Mother   ?     ? 5 year surveillance  ? Inflammatory bowel disease Neg Hx   ? Colon cancer Neg Hx   ? Liver disease Neg Hx   ? ? ?Social History  ? ?Socioeconomic History  ? Marital status: Married  ?  Spouse name: Not on file  ? Number of children: 1  ? Years of education: Not on file  ? Highest education level: Not on file  ?Occupational History  ? Occupation: in school  ?Tobacco Use  ? Smoking status: Never  ? Smokeless tobacco: Never  ?Vaping Use  ? Vaping Use: Never used  ?Substance and Sexual Activity  ? Alcohol use: No  ?  Alcohol/week: 0.0 standard drinks  ? Drug use: No  ? Sexual activity: Yes  ?  Birth control/protection: None  ?Other Topics Concern  ? Not on file  ?Social History Narrative  ? Not on file  ? ?Social Determinants of Health  ? ?Financial Resource Strain: Not on file  ?Food Insecurity: Not on file  ?Transportation Needs: Not on file  ?Physical Activity: Not on file  ?Stress: Not on file  ?Social Connections: Not on file  ? ? ? ? ? Review of Systems: ?Gen: Denies fever, chills, anorexia. Denies  fatigue, weakness, weight loss.  ?CV: Denies chest pain, palpitations, syncope, peripheral edema, and claudication. ?Resp: Denies dyspnea at rest, cough, wheezing, coughing up blood, and pleurisy. ?GI: see HPI ?Derm: Denies rash, itching, dry skin ?Psych: Denies depression, anxiety, memory loss, confusion. No homicidal or suicidal ideation.  ?Heme: Denies bruising, bleeding, and enlarged lymph nodes. ? ?Observations/Objective: ?No distress. Unable to perform physical exam due to video encounter.  ? ?Assessment and Plan: ?Melissa Schmidt is a 46 year old female presenting today with history of celiac disease diagnosed in 2017, chronic GERD, fatty liver, intermittent RUQ, chronic IDA with worsening iron deficiency last year requiring IV iron. Last colonoscopy in 2017 overall normal.  ? ?Celiac disease: now adhering to gluten-free diet. IDA resolved. Last iron infusions last year. We discussed low threshold for colonoscopy/EGD if recurrent IDA. I suspect non-compliance with diet contributed to IDA.  ? ?RUQ pain: much improved. Gallbladder present. Biliary  etiology ruled out. Known fatty liver. Update HFP.  ? ?GERD: well controlled on PPI daily ? ?Will check Vit D, HFP, celiac serologeis (to ensure adherence). DEXA scan planned.  ? ?Return in 3 months.  ? ? ?Follow Up Instructions: ? ?  ?I discussed the assessment and treatment plan with the patient. The patient was provided an opportunity to ask questions and all were answered. The patient agreed with the plan and demonstrated an understanding of the instructions. ?  ?The patient was advised to call back or seek an in-person evaluation if the symptoms worsen or if the condition fails to improve as anticipated. ? ?I provided 10 minutes of face-to-face time during this MyChart Video encounter. ? ?Annitta Needs, PhD, ANP-BC ?[facility]  ? ? ? ? ?

## 2021-05-11 NOTE — Telephone Encounter (Signed)
[facility] clinical pool: please arrange DEXA scan due to history of celiac disease. ? ?Dena: if not already done, patient needs labs sent to [place]. They have been ordered. Thanks! ? ?Stacey: patient needs 3 month follow-up.  ?

## 2021-05-11 NOTE — Patient Instructions (Signed)
Please have blood work done at [facility].  ? ?Continue to avoid gluten as you are doing! ? ?We will see you in 3 months!  ? ?Please call if any further or worsening issues with feeling full early. ? ?I enjoyed seeing you again today! As you know, I value our relationship and want to provide genuine, compassionate, and quality care. I welcome your feedback. If you receive a survey regarding your visit,  I greatly appreciate you taking time to fill this out. See you next time! ? ?Annitta Needs, PhD, ANP-BC ?[facility]  ? ?

## 2021-05-12 ENCOUNTER — Encounter: Payer: Self-pay | Admitting: *Deleted

## 2021-05-12 NOTE — Telephone Encounter (Signed)
Appt info sent via mychart ?

## 2021-05-12 NOTE — Addendum Note (Signed)
Addended by: Cheron Every on: 05/12/2021 08:03 AM ? ? Modules accepted: Orders ? ?

## 2021-05-12 NOTE — Telephone Encounter (Signed)
Pt notified of her lab forms sent to [place] ?

## 2021-05-17 ENCOUNTER — Other Ambulatory Visit (HOSPITAL_COMMUNITY): Payer: BC Managed Care – PPO

## 2021-07-07 ENCOUNTER — Ambulatory Visit: Payer: BC Managed Care – PPO | Admitting: "Endocrinology

## 2021-08-05 ENCOUNTER — Encounter: Payer: Self-pay | Admitting: Internal Medicine

## 2021-08-29 ENCOUNTER — Other Ambulatory Visit: Payer: Self-pay | Admitting: Gastroenterology

## 2021-08-29 DIAGNOSIS — K219 Gastro-esophageal reflux disease without esophagitis: Secondary | ICD-10-CM

## 2021-08-29 DIAGNOSIS — R1319 Other dysphagia: Secondary | ICD-10-CM

## 2021-11-19 ENCOUNTER — Other Ambulatory Visit: Payer: Self-pay | Admitting: "Endocrinology

## 2021-12-11 ENCOUNTER — Other Ambulatory Visit: Payer: Self-pay | Admitting: Gastroenterology

## 2021-12-11 DIAGNOSIS — R1319 Other dysphagia: Secondary | ICD-10-CM

## 2021-12-11 DIAGNOSIS — K219 Gastro-esophageal reflux disease without esophagitis: Secondary | ICD-10-CM

## 2022-01-12 IMAGING — MR MR KNEE*R* W/O CM
4 of 6 series · 19 of 40 positions shown · non-contrast
Comparison: Radiography of the Knee dated 06/12/2020.

CLINICAL DATA: Patient complains of right knee pain with knee
instability symptoms x several months and has had multiple falls.

EXAM:
MRI OF THE RIGHT KNEE WITHOUT CONTRAST
TECHNIQUE: Multiplanar, multisequence MR imaging of the knee was performed. No
intravenous contrast was administered.

[Series 6: T2 fat-sat · axial · right · 4.0mm · 0.35mm/px · z∈[-68,+23]mm · 3 of 33 slices shown (1 of 2)]
[im 6/33]
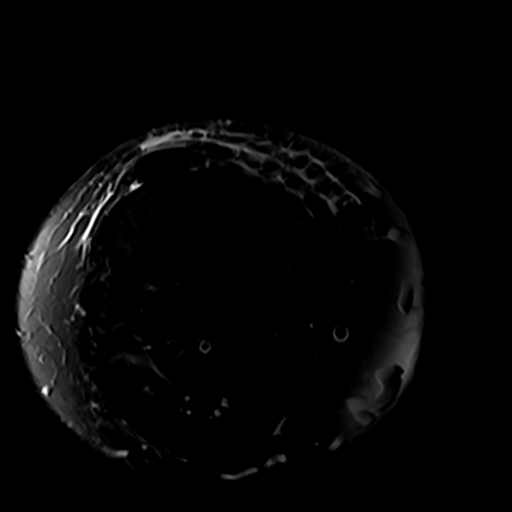
[im 17/33]
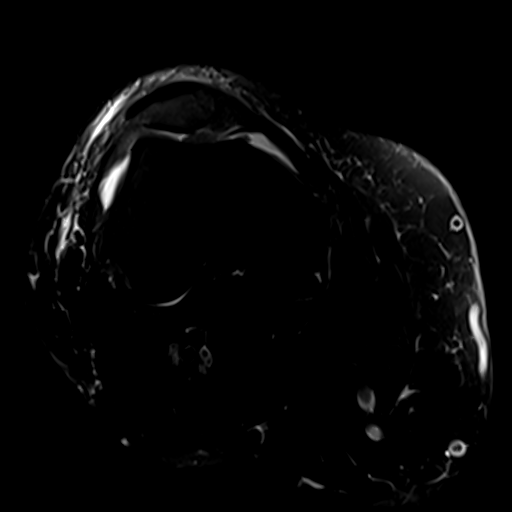
[im 27/33]
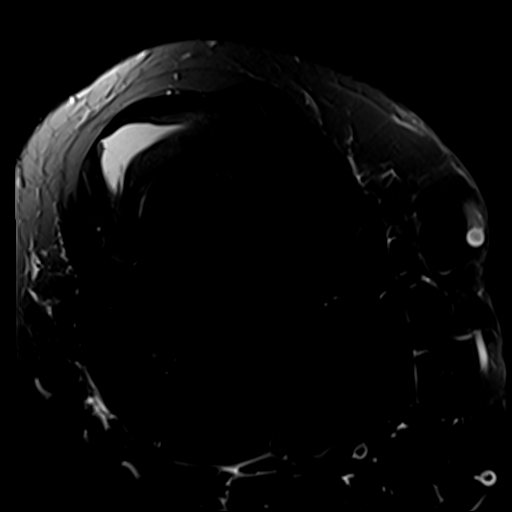

[Series 8: T2 fat-sat · coronal · right · 4.0mm · 0.33mm/px · 3 of 44 slices shown (2 of 2)]
[im 8/44]
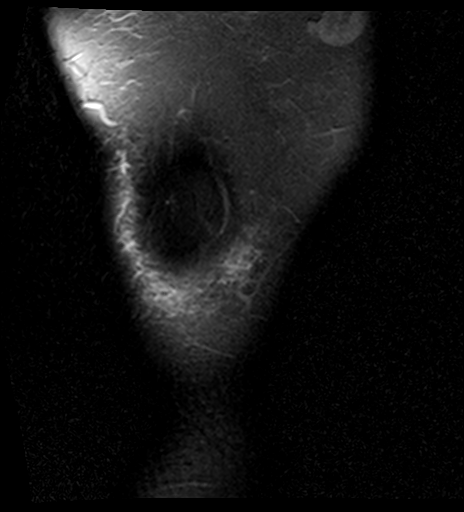
[im 22/44]
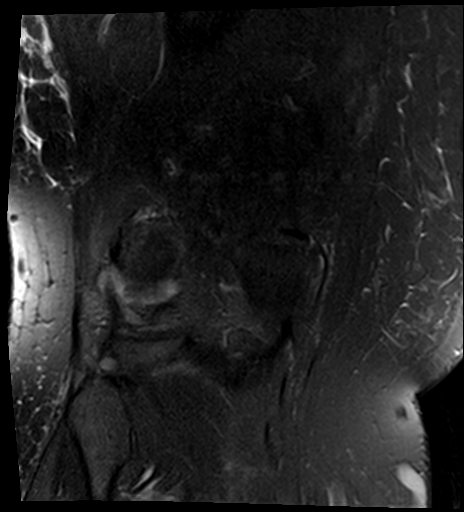
[im 36/44]
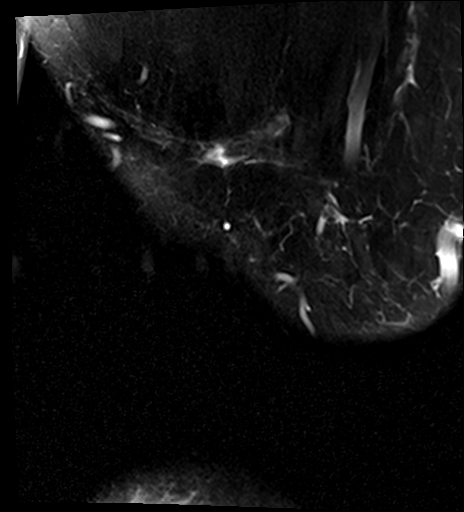

[Series 9: PD fat-sat · coronal · right · 4.0mm · 0.33mm/px · 7 of 44 slices shown (1 of 2)]
[im 1/44]
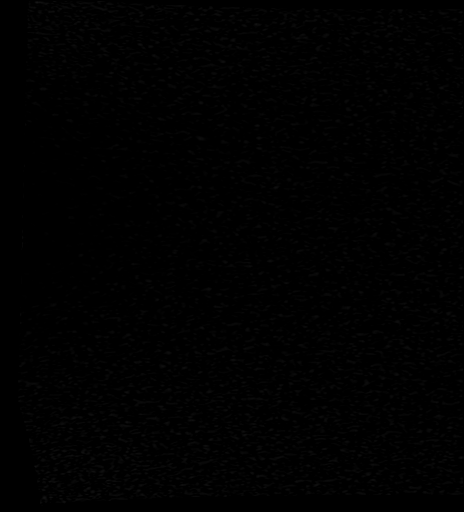
[im 8/44]
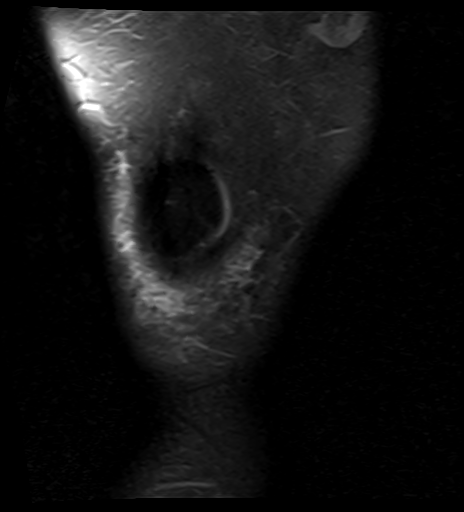
[im 15/44]
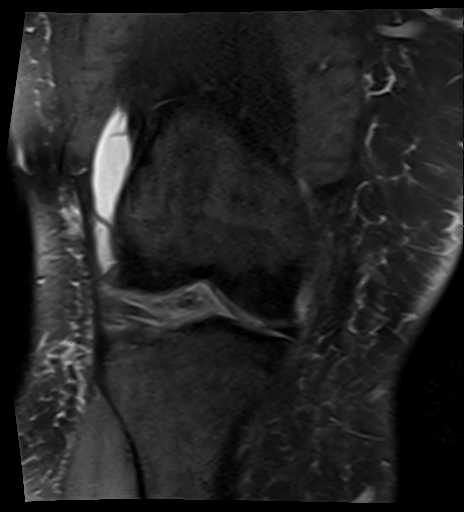
[im 22/44]
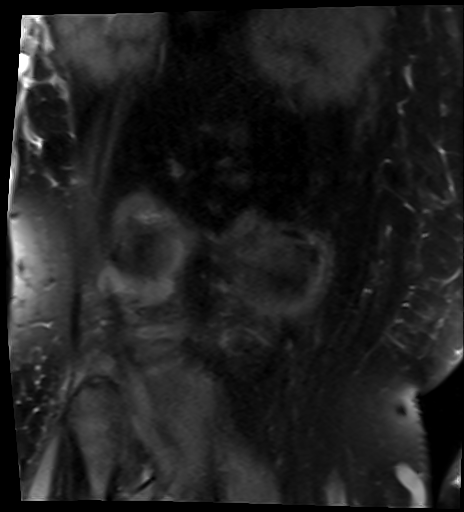
[im 29/44]
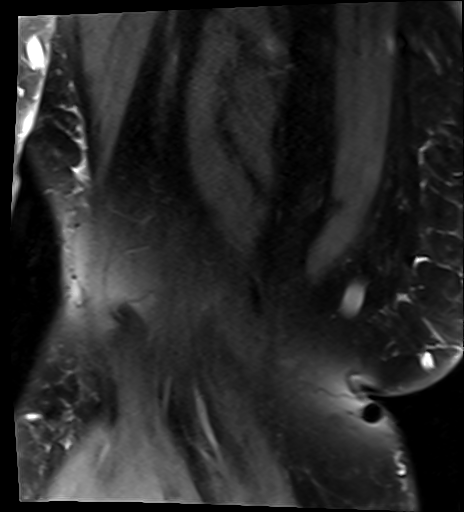
[im 36/44]
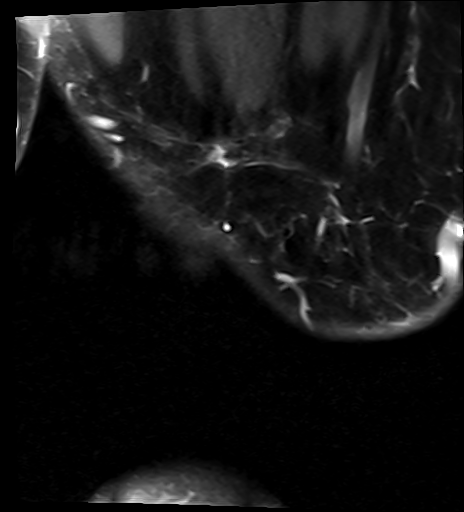
[im 44/44]
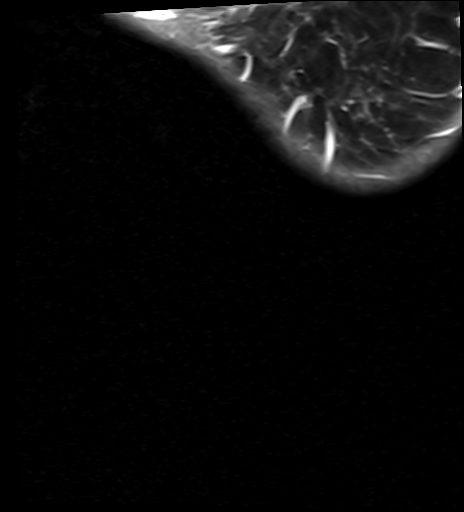

[Series 10: PD fat-sat · oblique · right · 4.0mm · 0.31mm/px · 6 of 37 slices shown (2 of 2)]
[im 1/37]
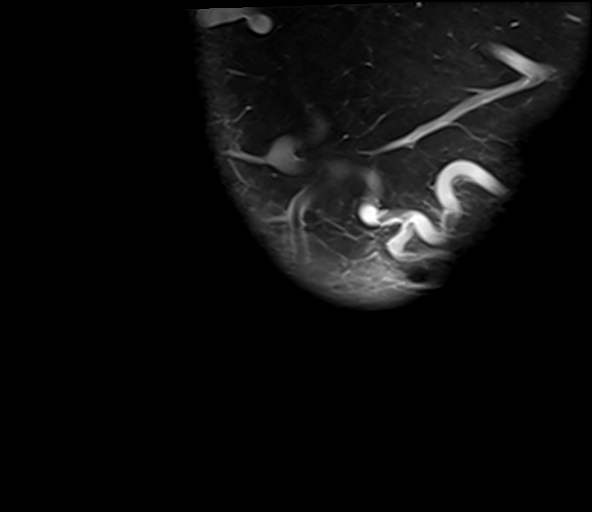
[im 8/37]
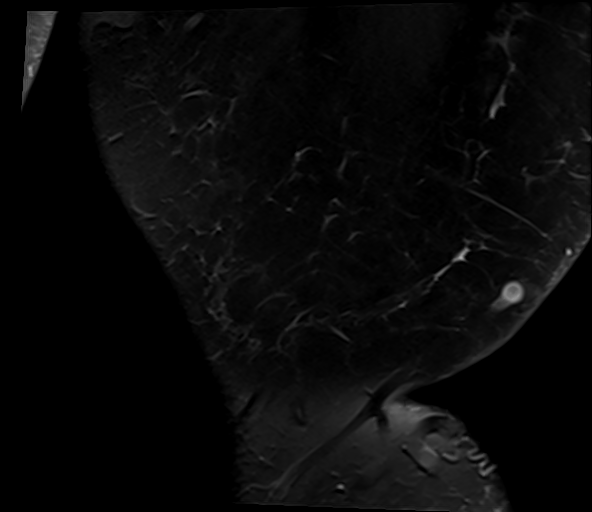
[im 15/37]
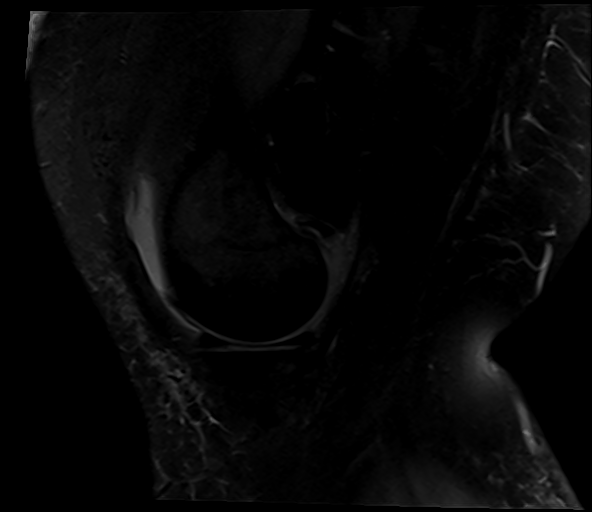
[im 22/37]
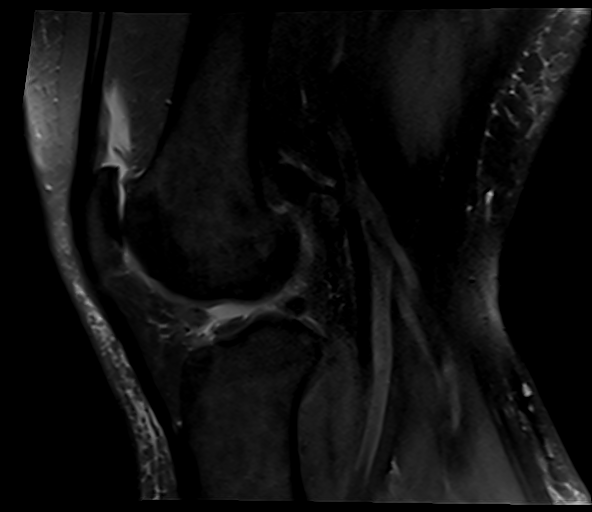
[im 29/37]
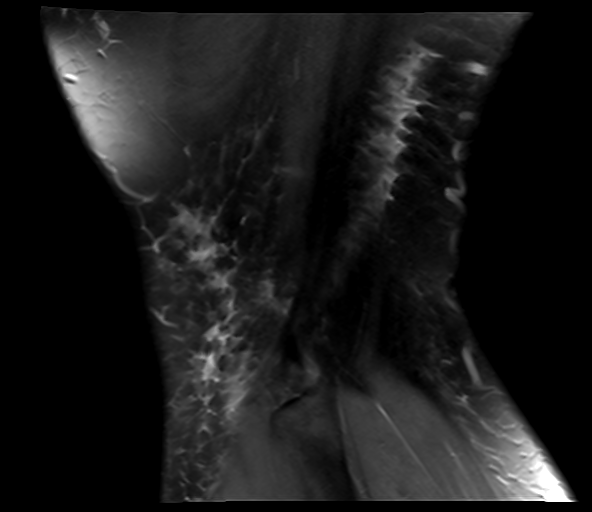
[im 37/37]
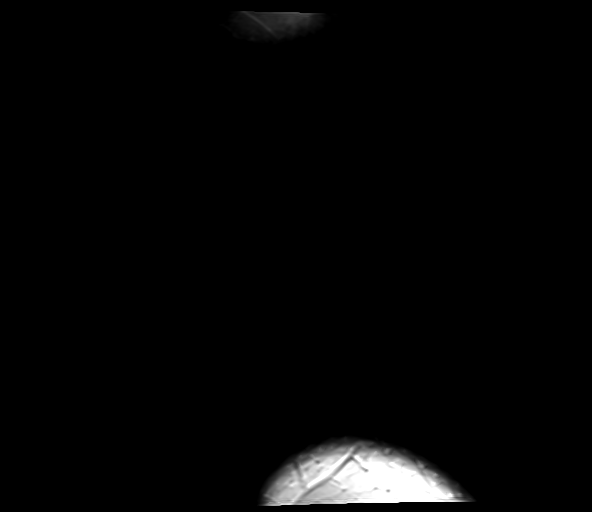

[19 of 40 positions shown; findings below may reference images not displayed]

FINDINGS: MENISCI

Medial meniscus:  Intact.

Lateral meniscus:  Intact.

LIGAMENTS

Cruciates:  Intact ACL and PCL.

Collaterals: Medial collateral ligament is intact. Lateral
collateral ligament complex is intact.

CARTILAGE

Patellofemoral: Mild partial-thickness cartilage loss, more
prominent laterally.

Medial:  Mild diffuse cartilage thinning without focal defect.

Lateral: Focal partial-thickness cartilage loss over the mesial
aspect of the lateral femoral condyle.

Joint:  Small joint effusion.  Normal Hoffa's fat.

Popliteal Fossa:  No Baker cyst. Intact popliteus tendon.

Extensor Mechanism: Intact quadriceps tendon and patellar tendon.
Intact medial and lateral patellar retinaculum. Intact MPFL.

Bones: Mild lateral patellar subluxation. No acute fracture or
dislocation. No suspicious bone lesion. Tricompartmental marginal
osteophytes.

Other: None.
IMPRESSION: 1. No evidence of internal derangement.
2. Mild tricompartmental osteoarthritis.
3. Mild lateral patellar subluxation.

## 2022-04-04 ENCOUNTER — Other Ambulatory Visit: Payer: Self-pay | Admitting: "Endocrinology

## 2022-04-07 ENCOUNTER — Telehealth: Payer: Self-pay | Admitting: "Endocrinology

## 2022-04-07 DIAGNOSIS — E890 Postprocedural hypothyroidism: Secondary | ICD-10-CM

## 2022-04-07 MED ORDER — LEVOTHYROXINE SODIUM 200 MCG PO TABS: 200.0000 ug | ORAL_TABLET | Freq: Every day | ORAL | 0 refills | Status: DC

## 2022-04-07 NOTE — Telephone Encounter (Signed)
Pt called stating she requested refill for levothyroxine and it was denied due to her not coming in a while.  [facility] [place] - She wanted to see if it could be refilled and if she needed bloodwork before making another appointment?  If so can you send orders in for bloodwork?

## 2022-04-07 NOTE — Telephone Encounter (Signed)
Pt appt scheduled for 05/16/22 at 3:30, pt aware. Advised pt she would need to have labs drawn 1 week prior to appt. [facility] [address]

## 2022-05-11 LAB — TSH: TSH: 5.86 u[IU]/mL — ABNORMAL HIGH (ref 0.450–4.500)

## 2022-05-11 LAB — T4, FREE: Free T4: 1.42 ng/dL (ref 0.82–1.77)

## 2022-05-16 ENCOUNTER — Ambulatory Visit (INDEPENDENT_AMBULATORY_CARE_PROVIDER_SITE_OTHER): Payer: BC Managed Care – PPO | Admitting: "Endocrinology

## 2022-05-16 ENCOUNTER — Encounter: Payer: Self-pay | Admitting: "Endocrinology

## 2022-05-16 VITALS — BP 138/84 | HR 100 | Ht 63.0 in | Wt 304.4 lb

## 2022-05-16 DIAGNOSIS — E559 Vitamin D deficiency, unspecified: Secondary | ICD-10-CM | POA: Diagnosis not present

## 2022-05-16 DIAGNOSIS — E890 Postprocedural hypothyroidism: Secondary | ICD-10-CM

## 2022-05-16 DIAGNOSIS — E119 Type 2 diabetes mellitus without complications: Secondary | ICD-10-CM

## 2022-05-16 DIAGNOSIS — E782 Mixed hyperlipidemia: Secondary | ICD-10-CM | POA: Diagnosis not present

## 2022-05-16 LAB — POCT GLYCOSYLATED HEMOGLOBIN (HGB A1C): HbA1c, POC (controlled diabetic range): 6.5 % (ref 0.0–7.0)

## 2022-05-16 MED ORDER — LEVOTHYROXINE SODIUM 200 MCG PO TABS: 200.0000 ug | ORAL_TABLET | Freq: Every day | ORAL | 1 refills | Status: DC

## 2022-05-16 NOTE — Progress Notes (Signed)
05/16/2022           Endocrinology follow-up note  Subjective:    Patient ID: Melissa Schmidt, female    DOB: [birth year], PCP Arlina Robes, MD.   Past Medical History:  Diagnosis Date   Arthritis    Borderline diabetes    Celiac disease    Complication of anesthesia    shaking post anesthesia   Diabetes mellitus without complication    Hemorrhoids    Hypertriglyceridemia    PCOS (polycystic ovarian syndrome)    Past Surgical History:  Procedure Laterality Date   APPENDECTOMY  2002   COLONOSCOPY WITH PROPOFOL N/A 05/25/2015   Dr.Rourk- non-bleeding hemorrhoids, the examination was o/w normal   ESOPHAGEAL DILATION N/A 05/25/2015   Procedure: ESOPHAGEAL DILATION;  Surgeon: Corbin Ade, MD;  Location: [facility];  Service: Endoscopy;  Laterality: N/A;   ESOPHAGOGASTRODUODENOSCOPY  04/2017   [place]: EGD with irregular Z-line s/p biopsy, benign-appearing esophageal stenosis s/p dilation, normal examined duodenum but no biopsies of duodenum. Pathology with junctional type GE mucosa with chronic inflammation and surface erosion, no metaplasia, negative dysplasia.    ESOPHAGOGASTRODUODENOSCOPY (EGD) WITH PROPOFOL N/A 05/25/2015   Dr.Rourk- normal esophagus, dilated, gastric erosions, intact fundoplication, normal third portion of the duodenum, non-bleeding erosive gastropathy. duodenum bx= benign small bowel mucosa with patchy increased intraepithelial lymphocytes, mild villous blunting and mild crypt hyperplasia, no dysplasia or malignancy stomach bx= erosive gastritis with reactive changes   NISSEN FUNDOPLICATION  2005   Social History   Socioeconomic History   Marital status: Married    Spouse name: Not on file   Number of children: 1   Years of education: Not on file   Highest education level: Not on file  Occupational History   Occupation: in school  Tobacco Use   Smoking status: Never   Smokeless tobacco: Never  Vaping Use   Vaping Use: Never used  Substance  and Sexual Activity   Alcohol use: No    Alcohol/week: 0.0 standard drinks of alcohol   Drug use: No   Sexual activity: Yes    Birth control/protection: None  Other Topics Concern   Not on file  Social History Narrative   Not on file   Social Determinants of Health   Financial Resource Strain: Not on file  Food Insecurity: Not on file  Transportation Needs: Not on file  Physical Activity: Not on file  Stress: Not on file  Social Connections: Not on file   Outpatient Encounter Medications as of 05/16/2022  Medication Sig   albuterol (VENTOLIN HFA) 108 (90 Base) MCG/ACT inhaler Inhale 2 puffs into the lungs every 6 (six) hours as needed for shortness of breath.   Ascorbic Acid (VITAMIN C ADULT GUMMIES PO) Take 2 tablets by mouth daily.   BREO ELLIPTA 200-25 MCG/INH AEPB Inhale 1 puff into the lungs daily.   Cholecalciferol (VITAMIN D3) 125 MCG (5000 UT) CAPS Take 1 capsule (5,000 Units total) by mouth daily.   ferrous sulfate 325 (65 FE) MG EC tablet Take 325 mg by mouth daily with breakfast.   fluticasone (FLONASE) 50 MCG/ACT nasal spray Place 2 sprays into both nostrils daily as needed for allergies.   glimepiride (AMARYL) 1 MG tablet Take 1 mg by mouth daily.   hydrocortisone (ANUSOL-HC) 2.5 % rectal cream Place 1 application rectally 2 (two) times daily. (Patient taking differently: Place 1 application. rectally as needed.)   levothyroxine (SYNTHROID) 200 MCG tablet Take 1 tablet (200 mcg total) by mouth  daily before breakfast.   metFORMIN (GLUCOPHAGE) 1000 MG tablet Take 1,000 mg by mouth 2 (two) times daily with a meal.    Multiple Vitamin (MULTIVITAMIN) capsule Take 1 capsule by mouth daily.   pantoprazole (PROTONIX) 40 MG tablet TAKE 1 TABLET BY MOUTH ONCE DAILY BEFORE BREAKFAST   [DISCONTINUED] levothyroxine (SYNTHROID) 200 MCG tablet Take 1 tablet (200 mcg total) by mouth daily before breakfast.   No facility-administered encounter medications on file as of 05/16/2022.     ALLERGIES: Allergies  Allergen Reactions   Singulair [Montelukast]     VACCINATION STATUS:  There is no immunization history on file for this patient.   HPI  Melissa Schmidt is 47 y.o. 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  First treatment was administered in November 2020, with inadequate response.  She was treated again in January 2022.  She was initiated on levothyroxine for RAI induced hypothyroidism during her prior visits.  Her previsit labs are consistent with improved however still inadequate replacement.  She admits that there has been some interruption on treatment.  She is currently on levothyroxine 200 mcg p.o. daily before breakfast.  -Prior to treatment,  her thyroid uptake showed 68% in 24 hours  suggesting Graves' disease.  -She presents with improvement of her previous symptoms including fatigue.she has lost some weight since last visit.   She also has type 2 diabetes, currently on metformin 1000 mg p.o. twice daily.  She is also on glimepiride 1 mg p.o. daily at breakfast.  Her point-of-care A1c today is at 6.5%.   she denies family history of thyroid dysfunction nor any thyroid cancer.  She is grieving the death of her brother by suicide.                           Review of systems  Limited as above  Objective:    BP 138/84   Pulse 100   Ht 5\' 3"  (1.6 m)   Wt (!) 304 lb 6.4 oz (138.1 kg)   BMI 53.92 kg/m   Wt Readings from Last 3 Encounters:  05/16/22 (!) 304 lb 6.4 oz (138.1 kg)  05/11/21 292 lb (132.5 kg)  04/06/21 292 lb (132.5 kg)                   Component Value Date/Time   NA 140 11/05/2018 1551   K 4.6 11/05/2018 1551   CL 103 11/05/2018 1551   CO2 25 11/05/2018 1551   GLUCOSE 55 (L) 11/05/2018 1551   GLUCOSE 163 (H) 05/21/2015 1010   BUN 12 04/15/2020 0000   CREATININE 0.9 04/15/2020 0000   CREATININE 0.71 11/05/2018 1551   CALCIUM 9.1 04/15/2020 0000   PROT 6.8  12/17/2019 1106   PROT 6.8 11/05/2018 1551   ALBUMIN 4.2 11/05/2018 1551   AST 15 12/17/2019 1106   ALT 16 12/17/2019 1106   ALKPHOS 89 11/05/2018 1551   BILITOT 0.3 12/17/2019 1106   BILITOT <0.2 11/05/2018 1551   GFRNONAA 81 04/15/2020 0000   GFRAA 131 11/22/2019 0000     CBC    Component Value Date/Time   WBC 11.9 (H) 12/06/2019 1426   WBC 9.6 05/21/2015 1010   RBC 5.13 12/06/2019 1426   RBC 4.41 05/21/2015 1010   HGB 11.5 12/06/2019 1426   HCT 36.6 12/06/2019 1426   PLT 452 (H) 12/06/2019 1426   MCV 71 (  L) 12/06/2019 1426   MCH 22.4 (L) 12/06/2019 1426   MCH 25.4 (L) 05/21/2015 1010   MCHC 31.4 (L) 12/06/2019 1426   MCHC 31.5 05/21/2015 1010   RDW 15.4 12/06/2019 1426   LYMPHSABS 3.6 (H) 12/06/2019 1426   EOSABS 0.2 12/06/2019 1426   BASOSABS 0.0 12/06/2019 1426     Diabetic Labs (most recent): Lab Results  Component Value Date   HGBA1C 6.5 05/16/2022   HGBA1C 6.8 12/03/2020   HGBA1C 6.1 04/15/2020    Lipid Panel     Component Value Date/Time   CHOL 224 (A) 04/15/2020 0000   TRIG 192 (A) 04/15/2020 0000   HDL 45 04/15/2020 0000   LDLCALC 144 04/15/2020 0000     Thyroid uptake and scan on October 20, 20 FINDINGS: Homogeneous tracer accumulation within both thyroid lobes, diffusely increased.   No focal areas of increased or decreased tracer localization.  4 hour I-123 uptake = 57% (normal 5-20%)  24 hour I-123 uptake = 68% (normal 10-30%)   IMPRESSION: Diffusely increased homogeneous tracer accumulation in both thyroid lobes.   Increased 4 hour and 24 hour radio iodine uptakes consistent with hyperthyroidism.  Findings consistent with Graves disease.  I-131 thyroid ablation on December 12, 2018  Her response was only partial. Repeat thyroid uptake and scan on January 16, 2020 FINDINGS: Homogeneous tracer distribution in both thyroid lobes.  No focal areas of increased or decreased tracer localization.   4 hour I-123 uptake = 27.7% (normal  5-20%),  24 hour I-123 uptake = 43.7% (normal 10-30%)   IMPRESSION: Normal thyroid scan.  Mildly elevated 4 hour and 24 hour radio iodine uptakes.  Findings consistent with hyperthyroidism and Graves disease.   4 hour and 24 hour radio iodine uptakes have decreased since the previous exam.    Recent Results (from the past 2160 hour(s))  TSH     Status: Abnormal   Collection Time: 05/10/22  4:36 PM  Result Value Ref Range   TSH 5.860 (H) 0.450 - 4.500 uIU/mL  T4, Free     Status: None   Collection Time: 05/10/22  4:36 PM  Result Value Ref Range   Free T4 1.42 0.82 - 1.77 ng/dL  HgB [id number]     Status: None   Collection Time: 05/16/22  3:30 PM  Result Value Ref Range   Hemoglobin A1C     HbA1c POC (<> result, manual entry)     HbA1c, POC (prediabetic range)     HbA1c, POC (controlled diabetic range) 6.5 0.0 - 7.0 %   On February 21, 2020 she received second dose of RAI thyroid ablative therapy: Patient then received the radiopharmaceutical without immediate complication.   RADIOPHARMACEUTICALS:  25.8 mCi I-131 sodium iodide orally   IMPRESSION: Per oral administration of I-131 sodium iodide for the treatment of recurrent hyperthyroidism.  Assessment & Plan:   1.  RAI induced hypothyroidism  2. Graves' disease-resolved 3.  Type 2 diabetes 4.  Vitamin D deficiency 5.  Morbid obesity  -She  has responded to the second treatment with RAI with clinical and biochemical hypothyroidism.    Her previsit labs are consistent with inadequate replacement, however this is due to some interruption in her levothyroxine intake.  She is advised to continue levothyroxine 200 mcg p.o. daily before breakfast.  - We discussed about the correct intake of her thyroid hormone, on empty stomach at fasting, with water, separated by at least 30 minutes from breakfast and other medications,  and separated by more  than 4 hours from calcium, iron, multivitamins, acid reflux medications (PPIs). -Patient  is made aware of the fact that thyroid hormone replacement is needed for life, dose to be adjusted by periodic monitoring of thyroid function tests.  Related to her metabolic syndrome involving obesity, type 2 diabetes, she previously benefited from lifestyle medicine.  She is currently grieving the death of her brother.  She wishes to get back on track.   - she acknowledges that there is a room for improvement in her food and drink choices. - Suggestion is made for her to avoid simple carbohydrates  from her diet including Cakes, Sweet Desserts, Ice Cream, Soda (diet and regular), Sweet Tea, Candies, Chips, Cookies, Store Bought Juices, Alcohol , Artificial Sweeteners,  Coffee Creamer, and "Sugar-free" Products, Lemonade. This will help patient to have more stable blood glucose profile and potentially avoid unintended weight gain.  The following Lifestyle Medicine recommendations according to American College of Lifestyle Medicine  [facility]) were discussed and and offered to patient and she  agrees to start the journey:  A. Whole Foods, Plant-Based Nutrition comprising of fruits and vegetables, plant-based proteins, whole-grain carbohydrates was discussed in detail with the patient.   A list for source of those nutrients were also provided to the patient.  Patient will use only water or unsweetened tea for hydration. B.  The need to stay away from risky substances including alcohol, smoking; obtaining 7 to 9 hours of restorative sleep, at least 150 minutes of moderate intensity exercise weekly, the importance of healthy social connections,  and stress management techniques were discussed. C.  A full color page of  Calorie density of various food groups per pound showing examples of each food groups was provided to the patient.   She is advised to continue metformin 1000 mg p.o. twice daily, glimepiride 1 mg p.o. daily at breakfast. She will be considered for either SGLT2 inhibitors or low-dose  GLP-1  receptor agonists on subsequent visit.   For her vitamin D deficiency: She is on ongoing treatment with vitamin D3 5000 units daily.     - I advised her to maintain close follow up with Arlina Robes, MD for primary care needs.   I spent  25  minutes in the care of the patient today including review of labs from Thyroid Function, CMP, and other relevant labs ; imaging/biopsy records (current and previous including abstractions from other facilities); face-to-face time discussing  her lab results and symptoms, medications doses, her options of short and long term treatment based on the latest standards of care / guidelines;   and documenting the encounter.  Creta Bartch Schiavi  participated in the discussions, expressed understanding, and voiced agreement with the above plans.  All questions were answered to her satisfaction. she is encouraged to contact clinic should she have any questions or concerns prior to her return visit.    Follow up plan: Return in about 3 months (around 08/15/2022) for Fasting Labs  in AM B4 8, A1c -NV.   Thank you for involving me in the care of this pleasant patient, and I will continue to update you with her progress.  Marquis Lunch, MD [facility] Phone: [phone]  Fax: [phone]   05/16/2022, 4:59 PM  This note was partially dictated with voice recognition software. Similar sounding words can be transcribed inadequately or may not  be corrected upon review.

## 2022-05-16 NOTE — Patient Instructions (Signed)
                                     Advice for Weight Management  -For most of us the best way to lose weight is by diet management. Generally speaking, diet management means consuming less calories intentionally which over time brings about progressive weight loss.  This can be achieved more effectively by avoiding ultra processed carbohydrates, processed meats, unhealthy fats.    It is critically important to know your numbers: how much calorie you are consuming and how much calorie you need. More importantly, our carbohydrates sources should be unprocessed naturally occurring  complex starch food items.  It is always important to balance nutrition also by  appropriate intake of proteins (mainly plant-based), healthy fats/oils, plenty of fruits and vegetables.   -The American College of Lifestyle Medicine (ACL M) recommends nutrition derived mostly from Whole Food, Plant Predominant Sources example an apple instead of applesauce or apple pie. Eat Plenty of vegetables, Mushrooms, fruits, Legumes, Whole Grains, Nuts, seeds in lieu of processed meats, processed snacks/pastries red meat, poultry, eggs.  Use only water or unsweetened tea for hydration.  The College also recommends the need to stay away from risky substances including alcohol, smoking; obtaining 7-9 hours of restorative sleep, at least 150 minutes of moderate intensity exercise weekly, importance of healthy social connections, and being mindful of stress and seek help when it is overwhelming.    -Sticking to a routine mealtime to eat 3 meals a day and avoiding unnecessary snacks is shown to have a big role in weight control. Under normal circumstances, the only time we burn stored energy is when we are hungry, so allow  some hunger to take place- hunger means no food between appropriate meal times, only water.  It is not advisable to starve.   -It is better to avoid simple carbohydrates including:  Cakes, Sweet Desserts, Ice Cream, Soda (diet and regular), Sweet Tea, Candies, Chips, Cookies, Store Bought Juices, Alcohol in Excess of  1-2 drinks a day, Lemonade,  Artificial Sweeteners, Doughnuts, Coffee Creamers, "Sugar-free" Products, etc, etc.  This is not a complete list.....    -Consulting with certified diabetes educators is proven to provide you with the most accurate and current information on diet.  Also, you may be  interested in discussing diet options/exchanges , we can schedule a visit with Melissa Schmidt, RDN, CDE for individualized nutrition education.  -Exercise: If you are able: 30 -60 minutes a day ,4 days a week, or 150 minutes of moderate intensity exercise weekly.    The longer the better if tolerated.  Combine stretch, strength, and aerobic activities.  If you were told in the past that you have high risk for cardiovascular diseases, or if you are currently symptomatic, you may seek evaluation by your heart doctor prior to initiating moderate to intense exercise programs.                                  Additional Care Considerations for Diabetes/Prediabetes   -Diabetes  is a chronic disease.  The most important care consideration is regular follow-up with your diabetes care provider with the goal being avoiding or delaying its complications and to take advantage of advances in medications and technology.  If appropriate actions are taken early enough, type 2 diabetes can even be   reversed.  Seek information from the right source.  - Whole Food, Plant Predominant Nutrition is highly recommended: Eat Plenty of vegetables, Mushrooms, fruits, Legumes, Whole Grains, Nuts, seeds in lieu of processed meats, processed snacks/pastries red meat, poultry, eggs as recommended by American College of  Lifestyle Medicine (ACLM).  -Type 2 diabetes is known to coexist with other important comorbidities such as high blood pressure and high cholesterol.  It is critical to control not only the  diabetes but also the high blood pressure and high cholesterol to minimize and delay the risk of complications including coronary artery disease, stroke, amputations, blindness, etc.  The good news is that this diet recommendation for type 2 diabetes is also very helpful for managing high cholesterol and high blood blood pressure.  - Studies showed that people with diabetes will benefit from a class of medications known as ACE inhibitors and statins.  Unless there are specific reasons not to be on these medications, the standard of care is to consider getting one from these groups of medications at an optimal doses.  These medications are generally considered safe and proven to help protect the heart and the kidneys.    - People with diabetes are encouraged to initiate and maintain regular follow-up with eye doctors, foot doctors, dentists , and if necessary heart and kidney doctors.     - It is highly recommended that people with diabetes quit smoking or stay away from smoking, and get yearly  flu vaccine and pneumonia vaccine at least every 5 years.  See above for additional recommendations on exercise, sleep, stress management , and healthy social connections.      

## 2022-08-17 ENCOUNTER — Ambulatory Visit: Payer: BC Managed Care – PPO | Admitting: "Endocrinology

## 2022-09-08 ENCOUNTER — Emergency Department (HOSPITAL_COMMUNITY): Payer: BC Managed Care – PPO

## 2022-09-08 ENCOUNTER — Emergency Department (HOSPITAL_COMMUNITY)
Admission: EM | Admit: 2022-09-08 | Discharge: 2022-09-08 | Disposition: A | Discharge status: Home / Self Care | Payer: BC Managed Care – PPO | Attending: Emergency Medicine | Admitting: Emergency Medicine

## 2022-09-08 ENCOUNTER — Other Ambulatory Visit: Payer: Self-pay

## 2022-09-08 ENCOUNTER — Encounter (HOSPITAL_COMMUNITY): Payer: Self-pay | Admitting: *Deleted

## 2022-09-08 DIAGNOSIS — R110 Nausea: Secondary | ICD-10-CM | POA: Diagnosis not present

## 2022-09-08 DIAGNOSIS — Z7984 Long term (current) use of oral hypoglycemic drugs: Secondary | ICD-10-CM | POA: Diagnosis not present

## 2022-09-08 DIAGNOSIS — R1011 Right upper quadrant pain: Secondary | ICD-10-CM | POA: Insufficient documentation

## 2022-09-08 DIAGNOSIS — E119 Type 2 diabetes mellitus without complications: Secondary | ICD-10-CM | POA: Diagnosis not present

## 2022-09-08 DIAGNOSIS — K219 Gastro-esophageal reflux disease without esophagitis: Secondary | ICD-10-CM | POA: Insufficient documentation

## 2022-09-08 LAB — CBC
HCT: 40.2 % (ref 36.0–46.0)
Hemoglobin: 12.5 g/dL (ref 12.0–15.0)
MCH: 25.4 pg — ABNORMAL LOW (ref 26.0–34.0)
MCHC: 31.1 g/dL (ref 30.0–36.0)
MCV: 81.5 fL (ref 80.0–100.0)
Platelets: 295 10*3/uL (ref 150–400)
RBC: 4.93 MIL/uL (ref 3.87–5.11)
RDW: 15.1 % (ref 11.5–15.5)
WBC: 12.8 10*3/uL — ABNORMAL HIGH (ref 4.0–10.5)
nRBC: 0 % (ref 0.0–0.2)

## 2022-09-08 LAB — URINALYSIS, ROUTINE W REFLEX MICROSCOPIC
Bilirubin Urine: NEGATIVE
Glucose, UA: 50 mg/dL — AB
Hgb urine dipstick: NEGATIVE
Ketones, ur: NEGATIVE mg/dL
Leukocytes,Ua: NEGATIVE
Nitrite: NEGATIVE
Protein, ur: NEGATIVE mg/dL
Specific Gravity, Urine: 1.012 (ref 1.005–1.030)
pH: 5 (ref 5.0–8.0)

## 2022-09-08 LAB — PREGNANCY, URINE: Preg Test, Ur: NEGATIVE

## 2022-09-08 LAB — COMPREHENSIVE METABOLIC PANEL
ALT: 19 U/L (ref 0–44)
AST: 18 U/L (ref 15–41)
Albumin: 3.4 g/dL — ABNORMAL LOW (ref 3.5–5.0)
Alkaline Phosphatase: 66 U/L (ref 38–126)
Anion gap: 8 (ref 5–15)
BUN: 11 mg/dL (ref 6–20)
CO2: 26 mmol/L (ref 22–32)
Calcium: 8.9 mg/dL (ref 8.9–10.3)
Chloride: 101 mmol/L (ref 98–111)
Creatinine, Ser: 0.91 mg/dL (ref 0.44–1.00)
GFR, Estimated: >60 mL/min (ref >60)
Glucose, Bld: 231 mg/dL — ABNORMAL HIGH (ref 70–99)
Potassium: 3.7 mmol/L (ref 3.5–5.1)
Sodium: 135 mmol/L (ref 135–145)
Total Bilirubin: 0.3 mg/dL (ref 0.3–1.2)
Total Protein: 6.8 g/dL (ref 6.5–8.1)

## 2022-09-08 LAB — LIPASE, BLOOD: Lipase: 33 U/L (ref 11–51)

## 2022-09-08 MED ORDER — IOHEXOL 300 MG/ML  SOLN
100.0000 mL | Freq: Once | INTRAMUSCULAR | Status: AC | PRN
  Administered 2022-09-08: 100 mL via INTRAVENOUS

## 2022-09-08 MED ORDER — MORPHINE SULFATE (PF) 4 MG/ML IV SOLN
4.0000 mg | Freq: Once | INTRAVENOUS | Status: AC
  Administered 2022-09-08: 4 mg via INTRAVENOUS
  Filled 2022-09-08: qty 1

## 2022-09-08 MED ORDER — ONDANSETRON HCL 4 MG/2ML IJ SOLN
4.0000 mg | Freq: Once | INTRAMUSCULAR | Status: AC
  Administered 2022-09-08: 4 mg via INTRAVENOUS
  Filled 2022-09-08: qty 2

## 2022-09-08 MED ORDER — ONDANSETRON HCL 4 MG PO TABS: 4.0000 mg | ORAL_TABLET | Freq: Three times a day (TID) | ORAL | 0 refills | Status: DC | PRN

## 2022-09-08 MED ORDER — OXYCODONE HCL 5 MG PO TABS: 5.0000 mg | ORAL_TABLET | Freq: Four times a day (QID) | ORAL | 0 refills | Status: DC | PRN

## 2022-09-08 NOTE — ED Provider Notes (Signed)
[place] [facility] DEPARTMENT AT [facility] Provider Note   CSN: [id number] Arrival date & time: 09/08/22  1458     History  Chief Complaint  Patient presents with   Abdominal Pain    Melissa Schmidt is a 47 y.o. female with a history including type 2 diabetes, celiac disease, hypertriglyceridemia, PCOS and known fatty liver, also had a failed Nissen fundoplication, now with a known hiatal hernia, GERD treated with Protonix, presenting for evaluation of right upper quadrant pain.  She describes having similar episodes in the past, but has had a severe escalation in her pain over the past several days.  2 days ago she had significant pain which improved spontaneously, felt okay yesterday but today has had an escalation in this pain.  She describes sharp stabbing pain that radiates into her right flank region.  She has had nausea without vomiting, denies diarrhea, has had no fevers or chills.  Pain is worsened with movement and deep palpation at the site.  She denies chest pain, shortness of breath, fevers. No tx prior to arrival.   The history is provided by the patient.       Home Medications Prior to Admission medications   Medication Sig Start Date End Date Taking? Authorizing Provider  ondansetron (ZOFRAN) 4 MG tablet Take 1 tablet (4 mg total) by mouth every 8 (eight) hours as needed for nausea or vomiting. 09/08/22  Yes Sinan Tuch, Raynelle Fanning, PA-C  oxyCODONE (OXY IR/ROXICODONE) 5 MG immediate release tablet Take 1 tablet (5 mg total) by mouth every 6 (six) hours as needed for severe pain. 09/08/22  Yes Phillip Sandler, Raynelle Fanning, PA-C  albuterol (VENTOLIN HFA) 108 (90 Base) MCG/ACT inhaler Inhale 2 puffs into the lungs every 6 (six) hours as needed for shortness of breath. 07/27/20   [provider]  Ascorbic Acid (VITAMIN C ADULT GUMMIES PO) Take 2 tablets by mouth daily.    [provider]  BREO ELLIPTA 200-25 MCG/INH AEPB Inhale 1 puff into the lungs daily. 11/27/18   [provider]  Cholecalciferol (VITAMIN D3) 125 MCG (5000 UT) CAPS Take 1 capsule (5,000 Units total) by mouth daily. 12/03/20   Roma Kayser, MD  ferrous sulfate 325 (65 FE) MG EC tablet Take 325 mg by mouth daily with breakfast.    [provider]  fluticasone (FLONASE) 50 MCG/ACT nasal spray Place 2 sprays into both nostrils daily as needed for allergies.    [provider]  glimepiride (AMARYL) 1 MG tablet Take 1 mg by mouth daily. 09/19/18   [provider]  hydrocortisone (ANUSOL-HC) 2.5 % rectal cream Place 1 application rectally 2 (two) times daily. Patient taking differently: Place 1 application. rectally as needed. 12/11/18   Gelene Mink, NP  levothyroxine (SYNTHROID) 200 MCG tablet Take 1 tablet (200 mcg total) by mouth daily before breakfast. 05/16/22   Nida, Denman George, MD  metFORMIN (GLUCOPHAGE) 1000 MG tablet Take 1,000 mg by mouth 2 (two) times daily with a meal.     [provider]  Multiple Vitamin (MULTIVITAMIN) capsule Take 1 capsule by mouth daily.    [provider]  pantoprazole (PROTONIX) 40 MG tablet TAKE 1 TABLET BY MOUTH ONCE DAILY BEFORE BREAKFAST 12/16/21   Gelene Mink, NP      Allergies    Singulair [montelukast]    Review of Systems   Review of Systems  Constitutional:  Negative for fever.  HENT:  Negative for congestion.   Eyes: Negative.  Respiratory:  Negative for chest tightness and shortness of breath.   Cardiovascular:  Negative for chest pain.  Gastrointestinal:  Positive for abdominal pain, nausea and vomiting. Negative for diarrhea.  Genitourinary: Negative.   Musculoskeletal:  Negative for arthralgias, joint swelling and neck pain.  Skin: Negative.  Negative for rash and wound.  Neurological:  Negative for dizziness, weakness, light-headedness, numbness and headaches.  Psychiatric/Behavioral: Negative.    All other systems reviewed and are negative.   Physical Exam Updated Vital  Signs BP 135/67   Pulse 90   Temp 98.6 F (37 C) (Oral)   Resp 15   Ht 5\' 3"  (1.6 m)   Wt (!) 142 kg   SpO2 99%   BMI 55.45 kg/m  Physical Exam Vitals and nursing note reviewed.  Constitutional:      Appearance: She is well-developed.  HENT:     Head: Normocephalic and atraumatic.  Eyes:     Conjunctiva/sclera: Conjunctivae normal.  Cardiovascular:     Rate and Rhythm: Normal rate and regular rhythm.     Heart sounds: Normal heart sounds.  Pulmonary:     Effort: Pulmonary effort is normal.     Breath sounds: Normal breath sounds. No wheezing.  Abdominal:     General: Bowel sounds are normal.     Palpations: Abdomen is soft.     Tenderness: There is abdominal tenderness in the right upper quadrant. There is no guarding or rebound. Negative signs include Murphy's sign.     Hernia: No hernia is present.     Comments: Small tender nodule which appears to be in the subcutaneous layer right mid abdomen.   Musculoskeletal:        General: Normal range of motion.     Cervical back: Normal range of motion.  Skin:    General: Skin is warm and dry.  Neurological:     Mental Status: She is alert.     ED Results / Procedures / Treatments   Labs (all labs ordered are listed, but only abnormal results are displayed) Labs Reviewed  COMPREHENSIVE METABOLIC PANEL - Abnormal; Notable for the following components:      Result Value   Glucose, Bld 231 (*)    Albumin 3.4 (*)    All other components within normal limits  CBC - Abnormal; Notable for the following components:   WBC 12.8 (*)    MCH 25.4 (*)    All other components within normal limits  URINALYSIS, ROUTINE W REFLEX MICROSCOPIC - Abnormal; Notable for the following components:   Glucose, UA 50 (*)    All other components within normal limits  LIPASE, BLOOD  PREGNANCY, URINE    EKG None  Radiology CT ABDOMEN PELVIS W CONTRAST  Result Date: 09/08/2022 CLINICAL DATA:  Abdominal pain. EXAM: CT ABDOMEN AND PELVIS  WITH CONTRAST TECHNIQUE: Multidetector CT imaging of the abdomen and pelvis was performed using the standard protocol following bolus administration of intravenous contrast. RADIATION DOSE REDUCTION: This exam was performed according to the departmental dose-optimization program which includes automated exposure control, adjustment of the mA and/or kV according to patient size and/or use of iterative reconstruction technique. CONTRAST:  OMNIPAQUE IOHEXOL 300 MG/ML  SOLN COMPARISON:  None Available. FINDINGS: Lower chest: Lung bases are clear. No pericardial or pleural effusion. Hepatobiliary: Decreased attenuation consistent with fatty infiltration. No focal liver abnormality is seen. No gallstones, gallbladder wall thickening, or biliary dilatation. Pancreas: Unremarkable. No pancreatic ductal dilatation or surrounding inflammatory changes. Spleen: Normal in  size without focal abnormality. Adrenals/Urinary Tract: Adrenal glands are unremarkable. Kidneys are normal, without renal calculi, focal lesion, or hydronephrosis. Bladder is unremarkable. Stomach/Bowel: Stomach is within normal limits. Appendix appears to have been removed. No evidence of bowel wall thickening, distention, or inflammatory changes. Vascular/Lymphatic: No significant vascular findings are present. No enlarged abdominal or pelvic lymph nodes. Shotty retroperitoneal and iliac adenopathy is noted. Reproductive: Uterus and bilateral adnexa are unremarkable. There is an IUD in place. Other: No abdominal wall hernia or abnormality. No abdominopelvic ascites. Musculoskeletal: No acute or significant osseous findings. IMPRESSION: Hepatic fatty infiltration. Otherwise no acute abdominal or pelvic pathology. Electronically Signed   By: Layla Maw M.D.   On: 09/08/2022 18:33    Procedures Procedures    Medications Ordered in ED Medications  morphine (PF) 4 MG/ML injection 4 mg (4 mg Intravenous Given 09/08/22 1744)  ondansetron (ZOFRAN)  injection 4 mg (4 mg Intravenous Given 09/08/22 1745)  iohexol (OMNIPAQUE) 300 MG/ML solution 100 mL (100 mLs Intravenous Contrast Given 09/08/22 1809)    ED Course/ Medical Decision Making/ A&P                                 Medical Decision Making Patient presenting with right upper quadrant pain, nausea and vomiting.  She has known history of fatty liver, also has had a history of prior right upper quadrant pain with negative HIDA scan and ultrasound imaging in the past except for the fatty liver findings.  Her lab tests today are reassuring with normal LFTs, normal lipase, her diabetes is not well-controlled she has a glucose of 231, but a normal CO2 and a normal anion gap of 8.  She does have a white count of 12.8, therefore CT imaging was completed here to rule out an acute intra-abdominal infection, CT is negative except for the known fatty liver.  She is under the care of of GI here in [place], Dr. Teofilo Pod, she is encouraged follow-up care with them.  In the interim she is being scheduled for an outpatient ultrasound of her right upper quadrant to ensure a normal gallbladder.  Patient was advised to avoid fatty intake which can trigger biliary colic.  She is prescribed Zofran and oxycodone for symptom relief while we are waiting ultrasound results.  Amount and/or Complexity of Data Reviewed Labs: ordered.    Details: Per above. Radiology: ordered.    Details: Per above.  Risk Prescription drug management.           Final Clinical Impression(s) / ED Diagnoses Final diagnoses:  RUQ abdominal pain    Rx / DC Orders ED Discharge Orders          Ordered    ondansetron (ZOFRAN) 4 MG tablet  Every 8 hours PRN        09/08/22 1919    oxyCODONE (OXY IR/ROXICODONE) 5 MG immediate release tablet  Every 6 hours PRN        09/08/22 1919    US Abdomen Limited RUQ/Gall Gladder        09/08/22 1920              Burgess Amor, PA-C 09/08/22 1936    Rondel Baton, MD 09/10/22 [phone]

## 2022-09-08 NOTE — ED Notes (Signed)
Provider at bedside

## 2022-09-08 NOTE — ED Triage Notes (Signed)
Pt c/o right lower abdominal pain that has gotten worse today  Pt denies any n/v/d

## 2022-09-08 NOTE — Discharge Instructions (Signed)
Your lab tests and CT imaging today are stable, although your blood glucose level is elevated at 231 today but with no other apparent complications from this elevated glucose level.  An ultrasound has been ordered for take a closer look at your gallbladder and liver, call the phone number in the morning to schedule a time for your appointment.  You have been prescribed a medication for nausea and pain, do not drive within 4 hours of taking the pain medication as this can make you drowsy.  I recommend avoiding eating any fatty foods as fat in her diet is a trigger for gallbladder squeezing.  If your ultrasound is normal tomorrow you will need follow-up care with your GI specialist, I recommend arranging a follow-up appointment with them.  If your ultrasound is abnormal you will be advised further tomorrow.

## 2022-09-13 ENCOUNTER — Ambulatory Visit (HOSPITAL_COMMUNITY)
Admission: RE | Admit: 2022-09-13 | Discharge: 2022-09-13 | Disposition: A | Discharge status: Home / Self Care | Payer: BC Managed Care – PPO | Source: Ambulatory Visit | Attending: Emergency Medicine | Admitting: Emergency Medicine

## 2022-09-13 DIAGNOSIS — R1011 Right upper quadrant pain: Secondary | ICD-10-CM | POA: Diagnosis not present

## 2022-09-13 NOTE — ED Provider Notes (Signed)
The patient presented today for RUQ [place] after her visit on 09/08/2022. US shows fatty liver which was seen on her CT at that time and previously. No gallbladder stones or ductal dilation seen. Encouraged her to follow up with GI.    Achille Rich, PA-C 09/13/22 1248    Glendora Score, MD 09/13/22 2213

## 2022-09-15 ENCOUNTER — Ambulatory Visit: Payer: BC Managed Care – PPO | Admitting: Gastroenterology

## 2022-09-29 ENCOUNTER — Encounter: Payer: Self-pay | Admitting: Gastroenterology

## 2022-09-29 ENCOUNTER — Encounter: Payer: Self-pay | Admitting: *Deleted

## 2022-09-29 ENCOUNTER — Ambulatory Visit (INDEPENDENT_AMBULATORY_CARE_PROVIDER_SITE_OTHER): Payer: BC Managed Care – PPO | Admitting: Gastroenterology

## 2022-09-29 VITALS — BP 137/90 | HR 92 | Temp 98.4°F | Ht 63.0 in | Wt 318.0 lb

## 2022-09-29 DIAGNOSIS — K900 Celiac disease: Secondary | ICD-10-CM | POA: Diagnosis not present

## 2022-09-29 DIAGNOSIS — K828 Other specified diseases of gallbladder: Secondary | ICD-10-CM | POA: Diagnosis not present

## 2022-09-29 DIAGNOSIS — R1011 Right upper quadrant pain: Secondary | ICD-10-CM

## 2022-09-29 NOTE — Progress Notes (Signed)
Gastroenterology Office Note     Primary Care Physician:  Arlina Robes, MD  Primary Gastroenterologist: Dr. Jena Gauss    Chief Complaint   Chief Complaint  Patient presents with   Follow-up    Was seen in the ER recently for abd pain.      History of Present Illness   Melissa Schmidt is a 47 y.o. female presenting today with a history of celiac disease diagnosed in 2017, chronic GERD, fatty liver, intermittent RUQ, chronic IDA with worsening iron deficiency 2022 requiring IV iron. Last colonoscopy 2017; she was to have updated TCS/EGD but admits that she was not gluten-free at that time. By the time she was seen in April 2023, IDA had resolved.    RUQ pain: [place] with hepatic steatosis recently, no stones. CBD 4 mm. HIDA In 2022 with EF 65%. She was seen in the ED 8.1 with RUQ pain. LFTs normal then. Lipase normal. Hgb 12.5.    She is taking, Vit D, MVI, Vit C.    Has RUQ pain after eating. Following gluten-free diet. Now pain with anything she eats.   CT Sep 08, 2022: Shotty retroperitoneal and iliac adenopathy is noted.    Past Medical History:  Diagnosis Date   Arthritis    Borderline diabetes    Celiac disease    Complication of anesthesia    shaking post anesthesia   Diabetes mellitus without complication (HCC)    Hemorrhoids    Hypertriglyceridemia    PCOS (polycystic ovarian syndrome)     Past Surgical History:  Procedure Laterality Date   APPENDECTOMY  2002   COLONOSCOPY WITH PROPOFOL N/A 05/25/2015   Dr.Rourk- non-bleeding hemorrhoids, the examination was o/w normal   ESOPHAGEAL DILATION N/A 05/25/2015   Procedure: ESOPHAGEAL DILATION;  Surgeon: Corbin Ade, MD;  Location: [facility];  Service: Endoscopy;  Laterality: N/A;   ESOPHAGOGASTRODUODENOSCOPY  04/2017   [place]: EGD with irregular Z-line s/p biopsy, benign-appearing esophageal stenosis s/p dilation, normal examined duodenum but no biopsies of duodenum. Pathology with junctional type GE  mucosa with chronic inflammation and surface erosion, no metaplasia, negative dysplasia.    ESOPHAGOGASTRODUODENOSCOPY (EGD) WITH PROPOFOL N/A 05/25/2015   Dr.Rourk- normal esophagus, dilated, gastric erosions, intact fundoplication, normal third portion of the duodenum, non-bleeding erosive gastropathy. duodenum bx= benign small bowel mucosa with patchy increased intraepithelial lymphocytes, mild villous blunting and mild crypt hyperplasia, no dysplasia or malignancy stomach bx= erosive gastritis with reactive changes   NISSEN FUNDOPLICATION  2005    Current Outpatient Medications  Medication Sig Dispense Refill   albuterol (VENTOLIN HFA) 108 (90 Base) MCG/ACT inhaler Inhale 2 puffs into the lungs every 6 (six) hours as needed for shortness of breath.     Ascorbic Acid (VITAMIN C ADULT GUMMIES PO) Take 2 tablets by mouth daily.     BREO ELLIPTA 200-25 MCG/INH AEPB Inhale 1 puff into the lungs daily.     Cholecalciferol (VITAMIN D3) 125 MCG (5000 UT) CAPS Take 1 capsule (5,000 Units total) by mouth daily. 90 capsule 0   ferrous sulfate 325 (65 FE) MG EC tablet Take 325 mg by mouth daily with breakfast.     fluticasone (FLONASE) 50 MCG/ACT nasal spray Place 2 sprays into both nostrils daily as needed for allergies.     hydrocortisone (ANUSOL-HC) 2.5 % rectal cream Place 1 application rectally 2 (two) times daily. (Patient taking differently: Place 1 application  rectally as needed.) 30 g 1   levothyroxine (SYNTHROID) 200 MCG  tablet Take 1 tablet (200 mcg total) by mouth daily before breakfast. 90 tablet 1   metFORMIN (GLUCOPHAGE) 1000 MG tablet Take 1,000 mg by mouth 2 (two) times daily with a meal.      Multiple Vitamin (MULTIVITAMIN) capsule Take 1 capsule by mouth daily.     ondansetron (ZOFRAN) 4 MG tablet Take 1 tablet (4 mg total) by mouth every 8 (eight) hours as needed for nausea or vomiting. 20 tablet 0   oxyCODONE (OXY IR/ROXICODONE) 5 MG immediate release tablet Take 1 tablet (5 mg total)  by mouth every 6 (six) hours as needed for severe pain. 15 tablet 0   pantoprazole (PROTONIX) 40 MG tablet TAKE 1 TABLET BY MOUTH ONCE DAILY BEFORE BREAKFAST 90 tablet 3   PARoxetine (PAXIL-CR) 12.5 MG 24 hr tablet Take 12.5 mg by mouth daily.     propranolol (INDERAL) 10 MG tablet Take 10 mg by mouth 2 (two) times daily.     TRULICITY 0.75 MG/0.5ML SOPN Inject 0.5 mL every week by subcutaneous route, for diabetes.     No current facility-administered medications for this visit.    Allergies as of 09/29/2022 - Review Complete 09/29/2022  Allergen Reaction Noted   Singulair [montelukast]  05/31/2019    Family History  Problem Relation Age of Onset   Irritable bowel syndrome Mother    Colon polyps Mother        ? 5 year surveillance   Inflammatory bowel disease Neg Hx    Colon cancer Neg Hx    Liver disease Neg Hx     Social History   Socioeconomic History   Marital status: Married    Spouse name: Not on file   Number of children: 1   Years of education: Not on file   Highest education level: Not on file  Occupational History   Occupation: in school  Tobacco Use   Smoking status: Never   Smokeless tobacco: Never  Vaping Use   Vaping status: Never Used  Substance and Sexual Activity   Alcohol use: No    Alcohol/week: 0.0 standard drinks of alcohol   Drug use: No   Sexual activity: Yes    Birth control/protection: None  Other Topics Concern   Not on file  Social History Narrative   Not on file   Social Determinants of Health   Financial Resource Strain: Not on file  Food Insecurity: Not on file  Transportation Needs: Not on file  Physical Activity: Not on file  Stress: Not on file  Social Connections: Not on file  Intimate Partner Violence: Not on file     Review of Systems   Gen: Denies any fever, chills, fatigue, weight loss, lack of appetite.  CV: Denies chest pain, heart palpitations, peripheral edema, syncope.  Resp: Denies shortness of breath at  rest or with exertion. Denies wheezing or cough.  GI: Denies dysphagia or odynophagia. Denies jaundice, hematemesis, fecal incontinence. GU : Denies urinary burning, urinary frequency, urinary hesitancy MS: Denies joint pain, muscle weakness, cramps, or limitation of movement.  Derm: Denies rash, itching, dry skin Psych: Denies depression, anxiety, memory loss, and confusion Heme: Denies bruising, bleeding, and enlarged lymph nodes.   Physical Exam   BP (!) 144/90 (BP Location: Right Arm, Patient Position: Sitting, Cuff Size: Large)   Pulse 93   Temp 98.4 F (36.9 C) (Oral)   Ht 5\' 3"  (1.6 m)   Wt (!) 318 lb (144.2 kg)   LMP  (LMP Unknown)   SpO2  99%   BMI 56.33 kg/m  General:   Alert and oriented. Pleasant and cooperative. Well-nourished and well-developed.  Head:  Normocephalic and atraumatic. Eyes:  Without icterus Abdomen:  +BS, soft, TTP RUQ and non-distended. No HSM noted. No guarding or rebound. No masses appreciated.  Rectal:  Deferred  Msk:  Symmetrical without gross deformities. Normal posture. Extremities:  Without edema. Neurologic:  Alert and  oriented x4;  grossly normal neurologically. Skin:  Intact without significant lesions or rashes. Psych:  Alert and cooperative. Normal mood and affect.  Lab Results  Component Value Date   WBC 12.8 (H) 09/08/2022   HGB 12.5 09/08/2022   HCT 40.2 09/08/2022   MCV 81.5 09/08/2022   PLT 295 09/08/2022   Lab Results  Component Value Date   ALT 19 09/08/2022   AST 18 09/08/2022   ALKPHOS 66 09/08/2022   BILITOT 0.3 09/08/2022   Lab Results  Component Value Date   NA 135 09/08/2022   CL 101 09/08/2022   K 3.7 09/08/2022   CO2 26 09/08/2022   BUN 11 09/08/2022   CREATININE 0.91 09/08/2022   GFRNONAA >60 09/08/2022   CALCIUM 8.9 09/08/2022   ALBUMIN 3.4 (L) 09/08/2022   GLUCOSE 231 (H) 09/08/2022      Assessment   Melissa Schmidt is a 47 y.o. female presenting today with a history of celiac disease diagnosed in  2017, chronic GERD, fatty liver, intermittent RUQ, chronic IDA with worsening iron deficiency 2022 requiring IV iron. Last colonoscopy 2017; she was to have updated TCS/EGD but admits that she was not gluten-free at that time. By the time she was seen in April 2023, IDA had resolved.   RUQ pain now worsening. No gallstones on recent US, CBD normal. HIDA in the past with EF 65%. LFTs normal. I suspect underlying biliary dyskinesia. Will hold off on updating HIDA right now. She has had long-standing documented waxing and waning RUQ pain. Will refer to Surgery to discuss elective cholecystectomy. Notably, her last EGD was in 2019. However, she was having intermittent RUQ pain at that time as well. I doubt an EGD will shed any additional light at this time.  Celiac disease: following gluten-free diet. Continues with biliary-type pain. Referral to surgeon. DEXA in near future. Update labs.   CT Sep 08, 2022: Shotty retroperitoneal and iliac adenopathy is noted. Will review with radiologist. Unclear etiology. May need early interval updating to assess for changes.    PLAN    DEXA, iron studies, Vit D level Refer to Gen Surgery Continue strict gluten-free diet 6 month follow-up Will discuss CT findings with radiology    Gelene Mink, PhD, ANP-BC [facility]

## 2022-09-29 NOTE — Patient Instructions (Signed)
Please have blood work done at [facility].  We are arranging a bone density scan.  I am referring you to the surgeon for gallbladder removal.  We will see you in 6 months!  I enjoyed seeing you again today! I value our relationship and want to provide genuine, compassionate, and quality care. You may receive a survey regarding your visit with me, and I welcome your feedback! Thanks so much for taking the time to complete this. I look forward to seeing you again.      Gelene Mink, PhD, ANP-BC [facility]

## 2022-10-05 ENCOUNTER — Inpatient Hospital Stay (HOSPITAL_COMMUNITY): Admission: RE | Admit: 2022-10-05 | Payer: BC Managed Care – PPO | Source: Ambulatory Visit

## 2022-10-11 ENCOUNTER — Ambulatory Visit (HOSPITAL_COMMUNITY)
Admission: RE | Admit: 2022-10-11 | Discharge: 2022-10-11 | Disposition: A | Discharge status: Home / Self Care | Payer: BC Managed Care – PPO | Source: Ambulatory Visit | Attending: Gastroenterology | Admitting: Gastroenterology

## 2022-10-11 DIAGNOSIS — K900 Celiac disease: Secondary | ICD-10-CM | POA: Insufficient documentation

## 2022-10-13 ENCOUNTER — Ambulatory Visit (INDEPENDENT_AMBULATORY_CARE_PROVIDER_SITE_OTHER): Payer: BC Managed Care – PPO | Admitting: Physical Medicine and Rehabilitation

## 2022-10-13 ENCOUNTER — Encounter: Payer: Self-pay | Admitting: Physical Medicine and Rehabilitation

## 2022-10-13 DIAGNOSIS — M5416 Radiculopathy, lumbar region: Secondary | ICD-10-CM | POA: Diagnosis not present

## 2022-10-13 DIAGNOSIS — M5442 Lumbago with sciatica, left side: Secondary | ICD-10-CM

## 2022-10-13 DIAGNOSIS — G8929 Other chronic pain: Secondary | ICD-10-CM

## 2022-10-13 DIAGNOSIS — M7918 Myalgia, other site: Secondary | ICD-10-CM | POA: Diagnosis not present

## 2022-10-13 DIAGNOSIS — M5441 Lumbago with sciatica, right side: Secondary | ICD-10-CM

## 2022-10-13 MED ORDER — DIAZEPAM 5 MG PO TABS: ORAL_TABLET | ORAL | 0 refills | Status: DC

## 2022-10-13 MED ORDER — METHOCARBAMOL 500 MG PO TABS: 500.0000 mg | ORAL_TABLET | Freq: Three times a day (TID) | ORAL | 0 refills | Status: DC

## 2022-10-13 NOTE — Progress Notes (Signed)
Functional Pain Scale - descriptive words and definitions  Distressing (6)    Pain is present/unable to complete most ADLs limited by pain/sleep is difficult and active distraction is only marginal. Moderate range order  Average Pain 7  Lower back pain on both side with radiation in the legs to the knee

## 2022-10-13 NOTE — Progress Notes (Signed)
Melissa Schmidt - 47 y.o. female MRN [id number]  Date of birth: Dec 26, 1975  Office Visit Note: Visit Date: 10/13/2022 PCP: Arlina Robes, MD Referred by: Arlina Robes, *  Subjective: No chief complaint on file.  HPI: Melissa Schmidt is a 47 y.o. female who comes in today as a self referral for evaluation of chronic, worsening and severe bilateral lower back pain radiating to lateral thighs down to knees. Pain ongoing for several years, worsens with movement and activity. Severe pain with bending and prolonged standing with household activities. She describes as sore and aching, currently rates as 8 out of 10. Sitting helps to alleviate pain. Some relief of pain with home exercise regimen, rest and use of medications. No history of formal physical therapy. Some relief of with Tylenol. Lumbar x-rays from 2022 shows multi level spondylosis, no spondylolisthesis, no fractures. No history of lumbar surgery/injections. She is managed by Dr. Glee Arvin from orthopedic standpoint, recent gel injections in right knee helped to alleviate her pain significantly. 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 Patient denies focal weakness, numbness and tingling. No recent trauma or falls.    Review of Systems  Musculoskeletal:  Positive for back pain and myalgias.  Neurological:  Negative for tingling, sensory change, focal weakness and weakness.  All other systems reviewed and are negative.  Otherwise per HPI.  Assessment & Plan: Visit Diagnoses:    ICD-10-CM   1. Lumbar radiculopathy  M54.16 Ambulatory referral to Physical Therapy    MR LUMBAR SPINE WO CONTRAST    2. Chronic bilateral low back pain with bilateral sciatica  M54.42 Ambulatory referral to Physical Therapy   M54.41 MR LUMBAR SPINE WO CONTRAST   G89.29     3. Myofascial pain syndrome  M79.18 Ambulatory referral to Physical Therapy    MR LUMBAR SPINE WO CONTRAST    4. Morbid obesity (HCC)  E66.01 Ambulatory referral to Physical  Therapy    MR LUMBAR SPINE WO CONTRAST       Plan: Findings:  Chronic, worsening and severe bilateral lower back pain radiating to lateral thighs down to knees. Patient continues to have severe pain despite good conservative therapies such as home exercise regimen, rest and use of medications. Patients clinical presentation and exam are consistent with L5 nerve pattern. I also feel there is a myofascial component contributing to her pain. Tenderness noted to thoracic and lumbar paraspinal regions on exam today. Dr. Alvester Morin and myself reviewed recent CT of abdomen and pelvis, no lumbar central spinal stenosis noted. We discussed treatment plan in detail today. I placed order for lumbar MRI imaging. She voiced issues with claustrophobia, I also placed order for pre-procedure Valium for her to take on day of imaging. I do feel she would benefit from regimen of formal physical therapy/dry needling, I did go ahead and placed order for PT today. I will see patient back to discuss lumbar MRI imaging. Depending on lumbar MRI images we would consider performing lumbar injections. She has no questions at this time. No red flag symptoms noted upon exam today.     Meds & Orders:  Meds ordered this encounter  Medications   methocarbamol (ROBAXIN) 500 MG tablet    Sig: Take 1 tablet (500 mg total) by mouth 3 (three) times daily.    Dispense:  90 tablet    Refill:  0   diazepam (VALIUM) 5 MG tablet    Sig: Take one tablet by mouth with food one hour prior to  procedure. May repeat 30 minutes prior if needed.    Dispense:  2 tablet    Refill:  0    Orders Placed This Encounter  Procedures   MR LUMBAR SPINE WO CONTRAST   Ambulatory referral to Physical Therapy    Follow-up: Return for lumbar MRI review.   Procedures: No procedures performed      Clinical History: No specialty comments available.   She reports that she has never smoked. She has never used smokeless tobacco.  Recent Labs     05/16/22 1530  HGBA1C 6.5    Objective:  VS:  HT:    WT:   BMI:     BP:   HR: bpm  TEMP: ( )  RESP:  Physical Exam Vitals and nursing note reviewed.  HENT:     Head: Normocephalic and atraumatic.     Right Ear: External ear normal.     Left Ear: External ear normal.     Nose: Nose normal.     Mouth/Throat:     Mouth: Mucous membranes are moist.  Eyes:     Extraocular Movements: Extraocular movements intact.  Cardiovascular:     Rate and Rhythm: Normal rate.     Pulses: Normal pulses.  Pulmonary:     Effort: Pulmonary effort is normal.  Abdominal:     General: Abdomen is flat. There is no distension.  Musculoskeletal:        General: Tenderness present.     Cervical back: Normal range of motion.     Comments: Patient rises from seated position to standing without difficulty. Good lumbar range of motion. No pain noted with facet loading. 5/5 strength noted with bilateral hip flexion, knee flexion/extension, ankle dorsiflexion/plantarflexion and EHL. No clonus noted bilaterally. No pain upon palpation of greater trochanters. No pain with internal/external rotation of bilateral hips. Sensation intact bilaterally. Dysesthesias noted to bilateral L5 dermatomes. Tenderness noted to bilateral thoracic and lumbar paraspinal regions upon palpation. Negative slump test bilaterally. Ambulates without aid, gait steady.     Skin:    General: Skin is warm and dry.     Capillary Refill: Capillary refill takes less than 2 seconds.  Neurological:     General: No focal deficit present.     Mental Status: She is alert and oriented to person, place, and time.  Psychiatric:        Mood and Affect: Mood normal.        Behavior: Behavior normal.     Ortho Exam  Imaging: No results found.  Past Medical/Family/Surgical/Social History: Medications & Allergies reviewed per EMR, new medications updated. 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   Hemorrhoids 12/11/2018   Graves' disease 11/27/2018   RUQ abdominal pain 10/10/2018   Celiac disease 10/10/2018   GERD (gastroesophageal reflux disease) 10/10/2018   Hyperthyroidism 10/10/2017   Uncontrolled type 2 diabetes mellitus with hyperglycemia (HCC) 10/10/2017   Mucosal abnormality of stomach    Dysphagia    Positive autoantibody screening for celiac disease 04/29/2015   Fatty liver 03/19/2015   Colicky RUQ abdominal pain 03/19/2015   RLQ abdominal pain 03/19/2015   Rectal bleeding 03/19/2015   Past Medical  History:  Diagnosis Date   Arthritis    Borderline diabetes    Celiac disease    Complication of anesthesia    shaking post anesthesia   Diabetes mellitus without complication (HCC)    Hemorrhoids    Hypertriglyceridemia    PCOS (polycystic ovarian syndrome)    Family History  Problem Relation Age of Onset   Irritable bowel syndrome Mother    Colon polyps Mother        ? 5 year surveillance   Inflammatory bowel disease Neg Hx    Colon cancer Neg Hx    Liver disease Neg Hx    Past Surgical History:  Procedure Laterality Date   APPENDECTOMY  2002   COLONOSCOPY WITH PROPOFOL N/A 05/25/2015   Dr.Rourk- non-bleeding hemorrhoids, the examination was o/w normal   ESOPHAGEAL DILATION N/A 05/25/2015   Procedure: ESOPHAGEAL DILATION;  Surgeon: Corbin Ade, MD;  Location: [facility];  Service: Endoscopy;  Laterality: N/A;   ESOPHAGOGASTRODUODENOSCOPY  04/2017   [place]: EGD with irregular Z-line s/p biopsy, benign-appearing esophageal stenosis s/p dilation, normal examined duodenum but no biopsies of duodenum. Pathology with junctional type GE mucosa with  chronic inflammation and surface erosion, no metaplasia, negative dysplasia.    ESOPHAGOGASTRODUODENOSCOPY (EGD) WITH PROPOFOL N/A 05/25/2015   Dr.Rourk- normal esophagus, dilated, gastric erosions, intact fundoplication, normal third portion of the duodenum, non-bleeding erosive gastropathy. duodenum bx= benign small bowel mucosa with patchy increased intraepithelial lymphocytes, mild villous blunting and mild crypt hyperplasia, no dysplasia or malignancy stomach bx= erosive gastritis with reactive changes   NISSEN FUNDOPLICATION  2005   Social History   Occupational History   Occupation: in school  Tobacco Use   Smoking status: Never   Smokeless tobacco: Never  Vaping Use   Vaping status: Never Used  Substance and Sexual Activity   Alcohol use: No    Alcohol/week: 0.0 standard drinks of alcohol   Drug use: No   Sexual activity: Yes    Birth control/protection: None

## 2022-10-29 LAB — LIPID PANEL
Chol/HDL Ratio: 6.1 ratio — ABNORMAL HIGH (ref 0.0–4.4)
Cholesterol, Total: 188 mg/dL (ref 100–199)
HDL: 31 mg/dL — ABNORMAL LOW (ref >39)
LDL Chol Calc (NIH): 105 mg/dL — ABNORMAL HIGH (ref 0–99)
Triglycerides: 301 mg/dL — ABNORMAL HIGH (ref 0–149)
VLDL Cholesterol Cal: 52 mg/dL — ABNORMAL HIGH (ref 5–40)

## 2022-10-29 LAB — TSH: TSH: 7.22 u[IU]/mL — ABNORMAL HIGH (ref 0.450–4.500)

## 2022-10-29 LAB — T4, FREE: Free T4: 1.53 ng/dL (ref 0.82–1.77)

## 2022-10-29 LAB — IRON,TIBC AND FERRITIN PANEL
Ferritin: 136 ng/mL (ref 15–150)
Iron Saturation: 16 % (ref 15–55)
Iron: 40 ug/dL (ref 27–159)
Total Iron Binding Capacity: 256 ug/dL (ref 250–450)
UIBC: 216 ug/dL (ref 131–425)

## 2022-10-29 LAB — VITAMIN D 25 HYDROXY (VIT D DEFICIENCY, FRACTURES): Vit D, 25-Hydroxy: 25.4 ng/mL — ABNORMAL LOW (ref 30.0–100.0)

## 2022-11-01 ENCOUNTER — Ambulatory Visit (INDEPENDENT_AMBULATORY_CARE_PROVIDER_SITE_OTHER): Payer: BC Managed Care – PPO | Admitting: "Endocrinology

## 2022-11-01 ENCOUNTER — Encounter: Payer: Self-pay | Admitting: "Endocrinology

## 2022-11-01 ENCOUNTER — Ambulatory Visit: Payer: BC Managed Care – PPO | Admitting: General Surgery

## 2022-11-01 VITALS — BP 124/82 | HR 96 | Ht 63.0 in | Wt 319.4 lb

## 2022-11-01 DIAGNOSIS — E890 Postprocedural hypothyroidism: Secondary | ICD-10-CM | POA: Diagnosis not present

## 2022-11-01 DIAGNOSIS — E119 Type 2 diabetes mellitus without complications: Secondary | ICD-10-CM | POA: Diagnosis not present

## 2022-11-01 DIAGNOSIS — E559 Vitamin D deficiency, unspecified: Secondary | ICD-10-CM | POA: Diagnosis not present

## 2022-11-01 DIAGNOSIS — E782 Mixed hyperlipidemia: Secondary | ICD-10-CM

## 2022-11-01 DIAGNOSIS — Z7985 Long-term (current) use of injectable non-insulin antidiabetic drugs: Secondary | ICD-10-CM

## 2022-11-01 DIAGNOSIS — Z6841 Body Mass Index (BMI) 40.0 and over, adult: Secondary | ICD-10-CM

## 2022-11-01 DIAGNOSIS — Z7984 Long term (current) use of oral hypoglycemic drugs: Secondary | ICD-10-CM

## 2022-11-01 MED ORDER — TRULICITY 1.5 MG/0.5ML ~~LOC~~ SOAJ: 1.5000 mg | SUBCUTANEOUS | 1 refills | Status: DC

## 2022-11-01 MED ORDER — LEVOTHYROXINE SODIUM 25 MCG PO TABS: ORAL_TABLET | ORAL | 1 refills | Status: DC

## 2022-11-01 MED ORDER — ROSUVASTATIN CALCIUM 5 MG PO TABS: 5.0000 mg | ORAL_TABLET | Freq: Every evening | ORAL | 1 refills | Status: DC

## 2022-11-01 NOTE — Patient Instructions (Signed)

## 2022-11-01 NOTE — Progress Notes (Signed)
11/01/2022           Endocrinology follow-up note  Subjective:    Patient ID: Melissa Schmidt, female    DOB: [birth year], PCP Arlina Robes, MD.   Past Medical History:  Diagnosis Date   Arthritis    Borderline diabetes    Celiac disease    Complication of anesthesia    shaking post anesthesia   Diabetes mellitus without complication (HCC)    Hemorrhoids    Hypertriglyceridemia    PCOS (polycystic ovarian syndrome)    Past Surgical History:  Procedure Laterality Date   APPENDECTOMY  2002   COLONOSCOPY WITH PROPOFOL N/A 05/25/2015   Dr.Rourk- non-bleeding hemorrhoids, the examination was o/w normal   ESOPHAGEAL DILATION N/A 05/25/2015   Procedure: ESOPHAGEAL DILATION;  Surgeon: Corbin Ade, MD;  Location: [facility];  Service: Endoscopy;  Laterality: N/A;   ESOPHAGOGASTRODUODENOSCOPY  04/2017   [place]: EGD with irregular Z-line s/p biopsy, benign-appearing esophageal stenosis s/p dilation, normal examined duodenum but no biopsies of duodenum. Pathology with junctional type GE mucosa with chronic inflammation and surface erosion, no metaplasia, negative dysplasia.    ESOPHAGOGASTRODUODENOSCOPY (EGD) WITH PROPOFOL N/A 05/25/2015   Dr.Rourk- normal esophagus, dilated, gastric erosions, intact fundoplication, normal third portion of the duodenum, non-bleeding erosive gastropathy. duodenum bx= benign small bowel mucosa with patchy increased intraepithelial lymphocytes, mild villous blunting and mild crypt hyperplasia, no dysplasia or malignancy stomach bx= erosive gastritis with reactive changes   NISSEN FUNDOPLICATION  2005   Social History   Socioeconomic History   Marital status: Married    Spouse name: Not on file   Number of children: 1   Years of education: Not on file   Highest education level: Not on file  Occupational History   Occupation: in school  Tobacco Use   Smoking status: Never   Smokeless tobacco: Never  Vaping Use   Vaping status: Never Used   Substance and Sexual Activity   Alcohol use: No    Alcohol/week: 0.0 standard drinks of alcohol   Drug use: No   Sexual activity: Yes    Birth control/protection: None  Other Topics Concern   Not on file  Social History Narrative   Not on file   Social Determinants of Health   Financial Resource Strain: Not on file  Food Insecurity: Not on file  Transportation Needs: Not on file  Physical Activity: Not on file  Stress: Not on file  Social Connections: Not on file   Outpatient Encounter Medications as of 11/01/2022  Medication Sig   Dulaglutide (TRULICITY) 1.5 MG/0.5ML SOPN Inject 1.5 mg into the skin once a week.   levothyroxine (SYNTHROID) 25 MCG tablet This is in addition to the existing rx for 200 mcg to make a total daily dose of 225 mcg.   rosuvastatin (CRESTOR) 5 MG tablet Take 1 tablet (5 mg total) by mouth at bedtime.   albuterol (VENTOLIN HFA) 108 (90 Base) MCG/ACT inhaler Inhale 2 puffs into the lungs every 6 (six) hours as needed for shortness of breath.   Ascorbic Acid (VITAMIN C ADULT GUMMIES PO) Take 2 tablets by mouth daily.   BREO ELLIPTA 200-25 MCG/INH AEPB Inhale 1 puff into the lungs daily.   Cholecalciferol (VITAMIN D3) 125 MCG (5000 UT) CAPS Take 1 capsule (5,000 Units total) by mouth daily.   diazepam (VALIUM) 5 MG tablet Take one tablet by mouth with food one hour prior to procedure. May repeat 30 minutes prior if needed.   ferrous  sulfate 325 (65 FE) MG EC tablet Take 325 mg by mouth daily with breakfast.   fluticasone (FLONASE) 50 MCG/ACT nasal spray Place 2 sprays into both nostrils daily as needed for allergies.   hydrocortisone (ANUSOL-HC) 2.5 % rectal cream Place 1 application rectally 2 (two) times daily. (Patient taking differently: Place 1 application  rectally as needed.)   hydrOXYzine (ATARAX) 50 MG tablet Take 50 mg by mouth at bedtime.   levothyroxine (SYNTHROID) 200 MCG tablet Take 1 tablet (200 mcg total) by mouth daily before breakfast.    metFORMIN (GLUCOPHAGE) 1000 MG tablet Take 1,000 mg by mouth 2 (two) times daily with a meal.    methocarbamol (ROBAXIN) 500 MG tablet Take 1 tablet (500 mg total) by mouth 3 (three) times daily.   Multiple Vitamin (MULTIVITAMIN) capsule Take 1 capsule by mouth daily.   ondansetron (ZOFRAN) 4 MG tablet Take 1 tablet (4 mg total) by mouth every 8 (eight) hours as needed for nausea or vomiting.   oxyCODONE (OXY IR/ROXICODONE) 5 MG immediate release tablet Take 1 tablet (5 mg total) by mouth every 6 (six) hours as needed for severe pain.   pantoprazole (PROTONIX) 40 MG tablet TAKE 1 TABLET BY MOUTH ONCE DAILY BEFORE BREAKFAST   PARoxetine (PAXIL-CR) 12.5 MG 24 hr tablet Take 12.5 mg by mouth daily.   propranolol (INDERAL) 10 MG tablet Take 10 mg by mouth 2 (two) times daily.   [DISCONTINUED] TRULICITY 0.75 MG/0.5ML SOPN Inject 0.5 mL every week by subcutaneous route, for diabetes.   No facility-administered encounter medications on file as of 11/01/2022.    ALLERGIES: Allergies  Allergen Reactions   Singulair [Montelukast]     VACCINATION STATUS:  There is no immunization history on file for this patient.   HPI  Melissa Schmidt is 47 y.o. 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  First treatment was administered in November 2020, with inadequate response.  She was treated again in January 2022.  She was initiated on levothyroxine for RAI induced hypothyroidism during her prior visits.  She is currently on levothyroxine 200 mcg p.o. daily before breakfast.  Her previsit labs indicate inadequate replacement.  She also has type 2 diabetes with recent A1c of 7.5% increasing from her previous A1c of 6.5%.  In the interim, she was initiated on Trulicity 0.75 mg subcutaneously weekly.  She continues to tolerate this medication along with her metformin 1000 mg p.o. twice daily. -She presents with worsening weight gain coupled with most  recent initiation of antidepressant.  she denies family history of thyroid dysfunction nor any thyroid cancer.  She is grieving the death of her brother by suicide.                           Review of systems  Limited as above  Objective:    BP 124/82   Pulse 96   Ht 5\' 3"  (1.6 m)   Wt (!) 319 lb 6.4 oz (144.9 kg)   BMI 56.58 kg/m   Wt Readings from Last 3 Encounters:  11/01/22 (!) 319 lb 6.4 oz (144.9 kg)  09/29/22 (!) 318 lb (144.2 kg)  09/08/22 (!) 313 lb (142 kg)                   Component Value Date/Time   NA 135 09/08/2022 1547   NA 140 11/05/2018 1551   K 3.7 09/08/2022  1547   CL 101 09/08/2022 1547   CO2 26 09/08/2022 1547   GLUCOSE 231 (H) 09/08/2022 1547   BUN 11 09/08/2022 1547   BUN 12 04/15/2020 0000   CREATININE 0.91 09/08/2022 1547   CALCIUM 8.9 09/08/2022 1547   PROT 6.8 09/08/2022 1547   PROT 6.8 11/05/2018 1551   ALBUMIN 3.4 (L) 09/08/2022 1547   ALBUMIN 4.2 11/05/2018 1551   AST 18 09/08/2022 1547   ALT 19 09/08/2022 1547   ALKPHOS 66 09/08/2022 1547   BILITOT 0.3 09/08/2022 1547   BILITOT <0.2 11/05/2018 1551   GFRNONAA >60 09/08/2022 1547   GFRAA 131 11/22/2019 0000     CBC    Component Value Date/Time   WBC 12.8 (H) 09/08/2022 1547   RBC 4.93 09/08/2022 1547   HGB 12.5 09/08/2022 1547   HGB 11.5 12/06/2019 1426   HCT 40.2 09/08/2022 1547   HCT 36.6 12/06/2019 1426   PLT 295 09/08/2022 1547   PLT 452 (H) 12/06/2019 1426   MCV 81.5 09/08/2022 1547   MCV 71 (L) 12/06/2019 1426   MCH 25.4 (L) 09/08/2022 1547   MCHC 31.1 09/08/2022 1547   RDW 15.1 09/08/2022 1547   RDW 15.4 12/06/2019 1426   LYMPHSABS 3.6 (H) 12/06/2019 1426   EOSABS 0.2 12/06/2019 1426   BASOSABS 0.0 12/06/2019 1426     Diabetic Labs (most recent): Lab Results  Component Value Date   HGBA1C 6.5 05/16/2022   HGBA1C 6.8 12/03/2020   HGBA1C 6.1 04/15/2020    Lipid Panel     Component Value Date/Time   CHOL 188 10/28/2022 1637   TRIG 301 (H) 10/28/2022  1637   HDL 31 (L) 10/28/2022 1637   CHOLHDL 6.1 (H) 10/28/2022 1637   LDLCALC 105 (H) 10/28/2022 1637     Thyroid uptake and scan on October 20, 20 FINDINGS: Homogeneous tracer accumulation within both thyroid lobes, diffusely increased.   No focal areas of increased or decreased tracer localization.  4 hour I-123 uptake = 57% (normal 5-20%)  24 hour I-123 uptake = 68% (normal 10-30%)   IMPRESSION: Diffusely increased homogeneous tracer accumulation in both thyroid lobes.   Increased 4 hour and 24 hour radio iodine uptakes consistent with hyperthyroidism.  Findings consistent with Graves disease.  I-131 thyroid ablation on December 12, 2018  Her response was only partial. Repeat thyroid uptake and scan on January 16, 2020 FINDINGS: Homogeneous tracer distribution in both thyroid lobes.  No focal areas of increased or decreased tracer localization.   4 hour I-123 uptake = 27.7% (normal 5-20%),  24 hour I-123 uptake = 43.7% (normal 10-30%)   IMPRESSION: Normal thyroid scan.  Mildly elevated 4 hour and 24 hour radio iodine uptakes.  Findings consistent with hyperthyroidism and Graves disease.   4 hour and 24 hour radio iodine uptakes have decreased since the previous exam.    Recent Results (from the past 2160 hour(s))  Urinalysis, Routine w reflex microscopic -Urine, Clean Catch     Status: Abnormal   Collection Time: 09/08/22  3:25 PM  Result Value Ref Range   Color, Urine YELLOW YELLOW   APPearance CLEAR CLEAR   Specific Gravity, Urine 1.012 1.005 - 1.030   pH 5.0 5.0 - 8.0   Glucose, UA 50 (A) NEGATIVE mg/dL   Hgb urine dipstick NEGATIVE NEGATIVE   Bilirubin Urine NEGATIVE NEGATIVE   Ketones, ur NEGATIVE NEGATIVE mg/dL   Protein, ur NEGATIVE NEGATIVE mg/dL   Nitrite NEGATIVE NEGATIVE   Leukocytes,Ua NEGATIVE NEGATIVE  Comment: Performed at [facility], [address], [place], [place] [address]  Pregnancy, urine     Status: None   Collection Time:  09/08/22  3:25 PM  Result Value Ref Range   Preg Test, Ur NEGATIVE NEGATIVE    Comment:        THE SENSITIVITY OF THIS METHODOLOGY IS >25 mIU/mL. Performed at [facility], [address], [place], [place] [address]   Lipase, blood     Status: None   Collection Time: 09/08/22  3:47 PM  Result Value Ref Range   Lipase 33 11 - 51 U/L    Comment: Performed at [facility], [address] [place], [place] [address]  Comprehensive metabolic panel     Status: Abnormal   Collection Time: 09/08/22  3:47 PM  Result Value Ref Range   Sodium 135 135 - 145 mmol/L   Potassium 3.7 3.5 - 5.1 mmol/L   Chloride 101 98 - 111 mmol/L   CO2 26 22 - 32 mmol/L   Glucose, Bld 231 (H) 70 - 99 mg/dL    Comment: Glucose reference range applies only to samples taken after fasting for at least 8 hours.   BUN 11 6 - 20 mg/dL   Creatinine, Ser 2.95 0.44 - 1.00 mg/dL   Calcium 8.9 8.9 - 18.8 mg/dL   Total Protein 6.8 6.5 - 8.1 g/dL   Albumin 3.4 (L) 3.5 - 5.0 g/dL   AST 18 15 - 41 U/L   ALT 19 0 - 44 U/L   Alkaline Phosphatase 66 38 - 126 U/L   Total Bilirubin 0.3 0.3 - 1.2 mg/dL   GFR, Estimated >41 >66 mL/min    Comment: (NOTE) Calculated using the CKD-EPI Creatinine Equation (2021)    Anion gap 8 5 - 15    Comment: Performed at [facility], [address], [place], [place] [address]  CBC     Status: Abnormal   Collection Time: 09/08/22  3:47 PM  Result Value Ref Range   WBC 12.8 (H) 4.0 - 10.5 K/uL   RBC 4.93 3.87 - 5.11 MIL/uL   Hemoglobin 12.5 12.0 - 15.0 g/dL   HCT 60.1 09.3 - 23.5 %   MCV 81.5 80.0 - 100.0 fL   MCH 25.4 (L) 26.0 - 34.0 pg   MCHC 31.1 30.0 - 36.0 g/dL   RDW 57.3 22.0 - 25.4 %   Platelets 295 150 - 400 K/uL   nRBC 0.0 0.0 - 0.2 %    Comment: Performed at [facility] [place], [address], [place], [place] [address]  Vitamin D (25 hydroxy)     Status: Abnormal   Collection Time: 10/28/22  4:35 PM  Result Value Ref Range   Vit D, 25-Hydroxy 25.4 (L) 30.0 - 100.0 ng/mL    Comment:  Vitamin D deficiency has been defined by the Institute of Medicine and an Endocrine Society practice guideline as a level of serum 25-OH vitamin D less than 20 ng/mL (1,2). The Endocrine Society went on to further define vitamin D insufficiency as a level between 21 and 29 ng/mL (2). 1. IOM (Institute of Medicine). 2010. Dietary reference    intakes for calcium and D. [place]: The    [employer]. 2. Holick MF, Binkley [place], Bischoff-Ferrari HA, et al.    Evaluation, treatment, and prevention of vitamin D    deficiency: an Endocrine Society clinical practice    guideline. JCEM. 2011 Jul; 96(7):1911-30.   Iron, TIBC and Ferritin Panel  Status: None   Collection Time: 10/28/22  4:35 PM  Result Value Ref Range   Total Iron Binding Capacity 256 250 - 450 ug/dL   UIBC 161 096 - 045 ug/dL   Iron 40 27 - 409 ug/dL   Iron Saturation 16 15 - 55 %   Ferritin 136 15 - 150 ng/mL  TSH     Status: Abnormal   Collection Time: 10/28/22  4:37 PM  Result Value Ref Range   TSH 7.220 (H) 0.450 - 4.500 uIU/mL  T4, free     Status: None   Collection Time: 10/28/22  4:37 PM  Result Value Ref Range   Free T4 1.53 0.82 - 1.77 ng/dL  Lipid panel     Status: Abnormal   Collection Time: 10/28/22  4:37 PM  Result Value Ref Range   Cholesterol, Total 188 100 - 199 mg/dL   Triglycerides 811 (H) 0 - 149 mg/dL   HDL 31 (L) >91 mg/dL   VLDL Cholesterol Cal 52 (H) 5 - 40 mg/dL   LDL Chol Calc (NIH) 478 (H) 0 - 99 mg/dL   Chol/HDL Ratio 6.1 (H) 0.0 - 4.4 ratio    Comment:                                   T. Chol/HDL Ratio                                             Men  Women                               1/2 Avg.Risk  3.4    3.3                                   Avg.Risk  5.0    4.4                                2X Avg.Risk  9.6    7.1                                3X Avg.Risk 23.4   11.0    On February 21, 2020 she received second dose of RAI thyroid ablative therapy: Patient then  received the radiopharmaceutical without immediate complication.   RADIOPHARMACEUTICALS:  25.8 mCi I-131 sodium iodide orally   IMPRESSION: Per oral administration of I-131 sodium iodide for the treatment of recurrent hyperthyroidism.  Assessment & Plan:   1.  RAI induced hypothyroidism  2. Graves' disease-resolved 3.  Type 2 diabetes 4.  Vitamin D deficiency 5.  Morbid obesity 6.  Hyperlipidemia  -She  has responded to the second treatment with RAI with clinical and biochemical hypothyroidism.    Her previsit labs are consistent with inadequate replacement, she would benefit from a higher dose of levothyroxine.  I discussed and added 25 mcg of levothyroxine to her existing prescription of 40 levothyroxine 200 mcg to make a total daily dose of 225 mcg p.o. daily before breakfast.    - We discussed about  the correct intake of her thyroid hormone, on empty stomach at fasting, with water, separated by at least 30 minutes from breakfast and other medications,  and separated by more than 4 hours from calcium, iron, multivitamins, acid reflux medications (PPIs). -Patient is made aware of the fact that thyroid hormone replacement is needed for life, dose to be adjusted by periodic monitoring of thyroid function tests.   Related to her metabolic syndrome involving morbid obesity, type 2 diabetes, hyperlipidemia, she was previously offered lifestyle medicine.    -Her engagement is suboptimal.  I discussed and increase her Trulicity to 1.5 mg subcutaneous weekly along with her metformin 1000 mg p.o. twice daily.   - she acknowledges that there is a room for improvement in her food and drink choices. - Suggestion is made for her to avoid simple carbohydrates  from her diet including Cakes, Sweet Desserts, Ice Cream, Soda (diet and regular), Sweet Tea, Candies, Chips, Cookies, Store Bought Juices, Alcohol , Artificial Sweeteners,  Coffee Creamer, and "Sugar-free" Products, Lemonade. This will help  patient to have more stable blood glucose profile and potentially avoid unintended weight gain.  The following Lifestyle Medicine recommendations according to American College of Lifestyle Medicine  [facility]) were discussed and and offered to patient and she  agrees to start the journey:  A. Whole Foods, Plant-Based Nutrition comprising of fruits and vegetables, plant-based proteins, whole-grain carbohydrates was discussed in detail with the patient.   A list for source of those nutrients were also provided to the patient.  Patient will use only water or unsweetened tea for hydration. B.  The need to stay away from risky substances including alcohol, smoking; obtaining 7 to 9 hours of restorative sleep, at least 150 minutes of moderate intensity exercise weekly, the importance of healthy social connections,  and stress management techniques were discussed. C.  A full color page of  Calorie density of various food groups per pound showing examples of each food groups was provided to the patient.  She is advised to continue Crestor 5 mg p.o. nightly.  Side effects and precautions discussed with her. She is advised to continue vitamin D3 5000 units daily.   - I advised her to maintain close follow up with Arlina Robes, MD for primary care needs.  I spent  26  minutes in the care of the patient today including review of labs from Thyroid Function, CMP, and other relevant labs ; imaging/biopsy records (current and previous including abstractions from other facilities); face-to-face time discussing  her lab results and symptoms, medications doses, her options of short and long term treatment based on the latest standards of care / guidelines;   and documenting the encounter.  Zada Hultin Romanek  participated in the discussions, expressed understanding, and voiced agreement with the above plans.  All questions were answered to her satisfaction. she is encouraged to contact clinic should she have any  questions or concerns prior to her return visit.   Follow up plan: Return in about 6 months (around 05/01/2023) for Fasting Labs  in AM B4 8, A1c -NV.   Thank you for involving me in the care of this pleasant patient, and I will continue to update you with her progress.  Marquis Lunch, MD [facility] Phone: [phone]  Fax: [phone]   11/01/2022, 6:15 PM  This note was partially dictated with voice recognition software. Similar sounding words can be transcribed inadequately or may not  be corrected upon review.

## 2022-11-02 ENCOUNTER — Other Ambulatory Visit: Payer: Self-pay | Admitting: "Endocrinology

## 2022-11-02 DIAGNOSIS — E890 Postprocedural hypothyroidism: Secondary | ICD-10-CM

## 2022-11-03 ENCOUNTER — Ambulatory Visit: Payer: BC Managed Care – PPO | Admitting: General Surgery

## 2022-11-03 ENCOUNTER — Encounter: Payer: Self-pay | Admitting: General Surgery

## 2022-11-03 VITALS — BP 152/103 | HR 98 | Temp 98.2°F | Resp 16 | Ht 63.0 in | Wt 315.0 lb

## 2022-11-03 DIAGNOSIS — R1011 Right upper quadrant pain: Secondary | ICD-10-CM

## 2022-11-03 DIAGNOSIS — K811 Chronic cholecystitis: Secondary | ICD-10-CM | POA: Diagnosis not present

## 2022-11-03 NOTE — Patient Instructions (Signed)
Hold your Trulicity on Wednesday.

## 2022-11-03 NOTE — Progress Notes (Signed)
[facility] History and Physical  Reason for Referral: RUQ pain  Referring Physician:  Lewie Loron, NP  GI   Chief Complaint   New Patient (Initial Visit)     Melissa Schmidt is a 47 y.o. female.  HPI: Melissa Schmidt is a 47 yo who has had RUQ pain for several years that occurs with food. She has had negative [place], negative HIDA scan and continues to have this RUQ pain. She is followed by GI for her celiac disease and has had prior scopes in the past. She has also had CT scans with some minor retroperitoneal adenopathy in the setting of her celiac disease.   She was referred by GI to discuss cholecystectomy due to her ongoing symptoms.   Past Medical History:  Diagnosis Date   Arthritis    Borderline diabetes    Celiac disease    Complication of anesthesia    shaking post anesthesia   Diabetes mellitus without complication (HCC)    Hemorrhoids    Hypertriglyceridemia    PCOS (polycystic ovarian syndrome)     Past Surgical History:  Procedure Laterality Date   APPENDECTOMY  2002   COLONOSCOPY WITH PROPOFOL N/A 05/25/2015   Dr.Rourk- non-bleeding hemorrhoids, the examination was o/w normal   ESOPHAGEAL DILATION N/A 05/25/2015   Procedure: ESOPHAGEAL DILATION;  Surgeon: Corbin Ade, MD;  Location: [facility];  Service: Endoscopy;  Laterality: N/A;   ESOPHAGOGASTRODUODENOSCOPY  04/2017   [place]: EGD with irregular Z-line s/p biopsy, benign-appearing esophageal stenosis s/p dilation, normal examined duodenum but no biopsies of duodenum. Pathology with junctional type GE mucosa with chronic inflammation and surface erosion, no metaplasia, negative dysplasia.    ESOPHAGOGASTRODUODENOSCOPY (EGD) WITH PROPOFOL N/A 05/25/2015   Dr.Rourk- normal esophagus, dilated, gastric erosions, intact fundoplication, normal third portion of the duodenum, non-bleeding erosive gastropathy. duodenum bx= benign small bowel mucosa with patchy increased intraepithelial lymphocytes, mild villous  blunting and mild crypt hyperplasia, no dysplasia or malignancy stomach bx= erosive gastritis with reactive changes   NISSEN FUNDOPLICATION  2005    Family History  Problem Relation Age of Onset   Irritable bowel syndrome Mother    Colon polyps Mother        ? 5 year surveillance   Inflammatory bowel disease Neg Hx    Colon cancer Neg Hx    Liver disease Neg Hx     Social History   Tobacco Use   Smoking status: Never   Smokeless tobacco: Never  Vaping Use   Vaping status: Never Used  Substance Use Topics   Alcohol use: No    Alcohol/week: 0.0 standard drinks of alcohol   Drug use: No    Medications: I have reviewed the patient's current medications. Allergies as of 11/03/2022       Reactions   Singulair [montelukast]         Medication List        Accurate as of November 03, 2022 10:31 AM. If you have any questions, ask your nurse or doctor.          albuterol 108 (90 Base) MCG/ACT inhaler Commonly known as: VENTOLIN HFA Inhale 2 puffs into the lungs every 6 (six) hours as needed for shortness of breath.   Breo Ellipta 200-25 MCG/INH Aepb Generic drug: fluticasone furoate-vilanterol Inhale 1 puff into the lungs daily.   diazepam 5 MG tablet Commonly known as: VALIUM Take one tablet by mouth with food one hour prior to procedure. May repeat 30 minutes prior  if needed.   ferrous sulfate 325 (65 FE) MG EC tablet Take 325 mg by mouth daily with breakfast.   fluticasone 50 MCG/ACT nasal spray Commonly known as: FLONASE Place 2 sprays into both nostrils daily as needed for allergies.   hydrocortisone 2.5 % rectal cream Commonly known as: ANUSOL-HC Place 1 application rectally 2 (two) times daily. What changed:  when to take this reasons to take this   hydrOXYzine 50 MG tablet Commonly known as: ATARAX Take 50 mg by mouth at bedtime.   levothyroxine 200 MCG tablet Commonly known as: SYNTHROID Take 1 tablet (200 mcg total) by mouth daily before  breakfast.   levothyroxine 25 MCG tablet Commonly known as: SYNTHROID This is in addition to the existing rx for 200 mcg to make a total daily dose of 225 mcg.   metFORMIN 1000 MG tablet Commonly known as: GLUCOPHAGE Take 1,000 mg by mouth 2 (two) times daily with a meal.   methocarbamol 500 MG tablet Commonly known as: ROBAXIN Take 1 tablet (500 mg total) by mouth 3 (three) times daily.   multivitamin capsule Take 1 capsule by mouth daily.   ondansetron 4 MG tablet Commonly known as: ZOFRAN Take 1 tablet (4 mg total) by mouth every 8 (eight) hours as needed for nausea or vomiting.   oxyCODONE 5 MG immediate release tablet Commonly known as: Oxy IR/ROXICODONE Take 1 tablet (5 mg total) by mouth every 6 (six) hours as needed for severe pain.   pantoprazole 40 MG tablet Commonly known as: PROTONIX TAKE 1 TABLET BY MOUTH ONCE DAILY BEFORE BREAKFAST   PARoxetine 12.5 MG 24 hr tablet Commonly known as: PAXIL-CR Take 12.5 mg by mouth daily.   propranolol 10 MG tablet Commonly known as: INDERAL Take 10 mg by mouth 2 (two) times daily.   rosuvastatin 5 MG tablet Commonly known as: Crestor Take 1 tablet (5 mg total) by mouth at bedtime.   Trulicity 1.5 MG/0.5ML Sopn Generic drug: Dulaglutide Inject 1.5 mg into the skin once a week.   VITAMIN C ADULT GUMMIES PO Take 2 tablets by mouth daily.   Vitamin D3 125 MCG (5000 UT) Caps Take 1 capsule (5,000 Units total) by mouth daily.         ROS:  A comprehensive review of systems was negative except for: Ears, nose, mouth, throat, and face: positive for sinus issues Respiratory: positive for SOB Gastrointestinal: positive for abdominal pain, nausea, and reflux symptoms Genitourinary: positive for frequency Musculoskeletal: positive for back pain, neck pain, and joint pain  Blood pressure (!) 152/103, pulse 98, temperature 98.2 F (36.8 C), temperature source Oral, resp. rate 16, height 5\' 3"  (1.6 m), weight (!) 315 lb  (142.9 kg), SpO2 95%. Physical Exam Vitals reviewed.  HENT:     Head: Normocephalic.     Nose: Nose normal.  Eyes:     Extraocular Movements: Extraocular movements intact.  Cardiovascular:     Rate and Rhythm: Normal rate and regular rhythm.  Pulmonary:     Effort: Pulmonary effort is normal.     Breath sounds: Normal breath sounds.  Abdominal:     General: There is no distension.     Palpations: Abdomen is soft.     Tenderness: There is abdominal tenderness in the right upper quadrant.  Musculoskeletal:        General: Normal range of motion.     Cervical back: Normal range of motion.  Skin:    General: Skin is warm.  Neurological:  General: No focal deficit present.     Mental Status: She is alert and oriented to person, place, and time.  Psychiatric:        Mood and Affect: Mood normal.        Behavior: Behavior normal.        Thought Content: Thought content normal.        Judgment: Judgment normal.     Results: Personally reviewed CT 09/2022- large fatty liver, no obvious stones in gallbladder  Previewed [place] and HIDA report- Negative    Assessment & Plan:  Melissa Schmidt is a 47 y.o. female with RUQ pain with food consistent with biliary colic symptoms and potentially some degree of chronic cholecystitis.   -PLAN: I counseled the patient about the indication, risks and benefits of robotic assisted laparoscopic cholecystectomy.  She understands there is a very small chance for bleeding, infection, injury to normal structures (including common bile duct), conversion to open surgery, persistent symptoms, evolution of postcholecystectomy diarrhea, need for secondary interventions, anesthesia reaction, cardiopulmonary issues and other risks not specifically detailed here. I described the expected recovery, the plan for follow-up and the restrictions during the recovery phase.  All questions were answered. Discussed this may not help her symptoms if it is not coming from her  gallbladder.   All questions were answered to the satisfaction of the patient.  Will need to hold Trulicity dose     Lucretia Roers 11/03/2022, 10:31 AM

## 2022-11-07 DIAGNOSIS — K811 Chronic cholecystitis: Secondary | ICD-10-CM | POA: Insufficient documentation

## 2022-11-07 NOTE — H&P (Signed)
[facility] History and Physical  Reason for Referral: RUQ pain  Referring Physician:  Lewie Loron, NP  GI   Chief Complaint   New Patient (Initial Visit)     Melissa Schmidt is a 47 y.o. female.  HPI: Melissa Schmidt is a 47 yo who has had RUQ pain for several years that occurs with food. She has had negative [place], negative HIDA scan and continues to have this RUQ pain. She is followed by GI for her celiac disease and has had prior scopes in the past. She has also had CT scans with some minor retroperitoneal adenopathy in the setting of her celiac disease.   She was referred by GI to discuss cholecystectomy due to her ongoing symptoms.   Past Medical History:  Diagnosis Date   Arthritis    Borderline diabetes    Celiac disease    Complication of anesthesia    shaking post anesthesia   Diabetes mellitus without complication (HCC)    Hemorrhoids    Hypertriglyceridemia    PCOS (polycystic ovarian syndrome)     Past Surgical History:  Procedure Laterality Date   APPENDECTOMY  2002   COLONOSCOPY WITH PROPOFOL N/A 05/25/2015   Dr.Rourk- non-bleeding hemorrhoids, the examination was o/w normal   ESOPHAGEAL DILATION N/A 05/25/2015   Procedure: ESOPHAGEAL DILATION;  Surgeon: Corbin Ade, MD;  Location: [facility];  Service: Endoscopy;  Laterality: N/A;   ESOPHAGOGASTRODUODENOSCOPY  04/2017   [place]: EGD with irregular Z-line s/p biopsy, benign-appearing esophageal stenosis s/p dilation, normal examined duodenum but no biopsies of duodenum. Pathology with junctional type GE mucosa with chronic inflammation and surface erosion, no metaplasia, negative dysplasia.    ESOPHAGOGASTRODUODENOSCOPY (EGD) WITH PROPOFOL N/A 05/25/2015   Dr.Rourk- normal esophagus, dilated, gastric erosions, intact fundoplication, normal third portion of the duodenum, non-bleeding erosive gastropathy. duodenum bx= benign small bowel mucosa with patchy increased intraepithelial lymphocytes, mild villous  blunting and mild crypt hyperplasia, no dysplasia or malignancy stomach bx= erosive gastritis with reactive changes   NISSEN FUNDOPLICATION  2005    Family History  Problem Relation Age of Onset   Irritable bowel syndrome Mother    Colon polyps Mother        ? 5 year surveillance   Inflammatory bowel disease Neg Hx    Colon cancer Neg Hx    Liver disease Neg Hx     Social History   Tobacco Use   Smoking status: Never   Smokeless tobacco: Never  Vaping Use   Vaping status: Never Used  Substance Use Topics   Alcohol use: No    Alcohol/week: 0.0 standard drinks of alcohol   Drug use: No    Medications: I have reviewed the patient's current medications. Allergies as of 11/03/2022       Reactions   Singulair [montelukast]         Medication List        Accurate as of November 03, 2022 10:31 AM. If you have any questions, ask your nurse or doctor.          albuterol 108 (90 Base) MCG/ACT inhaler Commonly known as: VENTOLIN HFA Inhale 2 puffs into the lungs every 6 (six) hours as needed for shortness of breath.   Breo Ellipta 200-25 MCG/INH Aepb Generic drug: fluticasone furoate-vilanterol Inhale 1 puff into the lungs daily.   diazepam 5 MG tablet Commonly known as: VALIUM Take one tablet by mouth with food one hour prior to procedure. May repeat 30 minutes prior  if needed.   ferrous sulfate 325 (65 FE) MG EC tablet Take 325 mg by mouth daily with breakfast.   fluticasone 50 MCG/ACT nasal spray Commonly known as: FLONASE Place 2 sprays into both nostrils daily as needed for allergies.   hydrocortisone 2.5 % rectal cream Commonly known as: ANUSOL-HC Place 1 application rectally 2 (two) times daily. What changed:  when to take this reasons to take this   hydrOXYzine 50 MG tablet Commonly known as: ATARAX Take 50 mg by mouth at bedtime.   levothyroxine 200 MCG tablet Commonly known as: SYNTHROID Take 1 tablet (200 mcg total) by mouth daily before  breakfast.   levothyroxine 25 MCG tablet Commonly known as: SYNTHROID This is in addition to the existing rx for 200 mcg to make a total daily dose of 225 mcg.   metFORMIN 1000 MG tablet Commonly known as: GLUCOPHAGE Take 1,000 mg by mouth 2 (two) times daily with a meal.   methocarbamol 500 MG tablet Commonly known as: ROBAXIN Take 1 tablet (500 mg total) by mouth 3 (three) times daily.   multivitamin capsule Take 1 capsule by mouth daily.   ondansetron 4 MG tablet Commonly known as: ZOFRAN Take 1 tablet (4 mg total) by mouth every 8 (eight) hours as needed for nausea or vomiting.   oxyCODONE 5 MG immediate release tablet Commonly known as: Oxy IR/ROXICODONE Take 1 tablet (5 mg total) by mouth every 6 (six) hours as needed for severe pain.   pantoprazole 40 MG tablet Commonly known as: PROTONIX TAKE 1 TABLET BY MOUTH ONCE DAILY BEFORE BREAKFAST   PARoxetine 12.5 MG 24 hr tablet Commonly known as: PAXIL-CR Take 12.5 mg by mouth daily.   propranolol 10 MG tablet Commonly known as: INDERAL Take 10 mg by mouth 2 (two) times daily.   rosuvastatin 5 MG tablet Commonly known as: Crestor Take 1 tablet (5 mg total) by mouth at bedtime.   Trulicity 1.5 MG/0.5ML Sopn Generic drug: Dulaglutide Inject 1.5 mg into the skin once a week.   VITAMIN C ADULT GUMMIES PO Take 2 tablets by mouth daily.   Vitamin D3 125 MCG (5000 UT) Caps Take 1 capsule (5,000 Units total) by mouth daily.         ROS:  A comprehensive review of systems was negative except for: Ears, nose, mouth, throat, and face: positive for sinus issues Respiratory: positive for SOB Gastrointestinal: positive for abdominal pain, nausea, and reflux symptoms Genitourinary: positive for frequency Musculoskeletal: positive for back pain, neck pain, and joint pain  Blood pressure (!) 152/103, pulse 98, temperature 98.2 F (36.8 C), temperature source Oral, resp. rate 16, height 5\' 3"  (1.6 m), weight (!) 315 lb  (142.9 kg), SpO2 95%. Physical Exam Vitals reviewed.  HENT:     Head: Normocephalic.     Nose: Nose normal.  Eyes:     Extraocular Movements: Extraocular movements intact.  Cardiovascular:     Rate and Rhythm: Normal rate and regular rhythm.  Pulmonary:     Effort: Pulmonary effort is normal.     Breath sounds: Normal breath sounds.  Abdominal:     General: There is no distension.     Palpations: Abdomen is soft.     Tenderness: There is abdominal tenderness in the right upper quadrant.  Musculoskeletal:        General: Normal range of motion.     Cervical back: Normal range of motion.  Skin:    General: Skin is warm.  Neurological:  General: No focal deficit present.     Mental Status: She is alert and oriented to person, place, and time.  Psychiatric:        Mood and Affect: Mood normal.        Behavior: Behavior normal.        Thought Content: Thought content normal.        Judgment: Judgment normal.     Results: Personally reviewed CT 09/2022- large fatty liver, no obvious stones in gallbladder  Previewed [place] and HIDA report- Negative    Assessment & Plan:  Melissa Schmidt is a 47 y.o. female with RUQ pain with food consistent with biliary colic symptoms and potentially some degree of chronic cholecystitis.   -PLAN: I counseled the patient about the indication, risks and benefits of robotic assisted laparoscopic cholecystectomy.  She understands there is a very small chance for bleeding, infection, injury to normal structures (including common bile duct), conversion to open surgery, persistent symptoms, evolution of postcholecystectomy diarrhea, need for secondary interventions, anesthesia reaction, cardiopulmonary issues and other risks not specifically detailed here. I described the expected recovery, the plan for follow-up and the restrictions during the recovery phase.  All questions were answered. Discussed this may not help her symptoms if it is not coming from her  gallbladder.   All questions were answered to the satisfaction of the patient.  Will need to hold Trulicity dose     Lucretia Roers 11/03/2022, 10:31 AM

## 2022-11-08 HISTORY — PX: CHOLECYSTECTOMY: SHX55

## 2022-11-08 NOTE — Patient Instructions (Addendum)
Your procedure is scheduled on: 11/11/2022  Report to [facility] Main Entrance at    6:00 AM.  Call this number if you have problems the morning of surgery: [phone]   Remember:   Do not Eat or Drink after midnight         No Smoking the morning of surgery  :  Take these medicines the morning of surgery with A SIP OF WATER: Levothyroxine, pantoprazole, propranolol, and risperidone  No Diabetic medication am of procedure  Hold Truliciy for 7 days prior to procedure   Do not wear jewelry, make-up or nail polish.  Do not wear lotions, powders, or perfumes. You may wear deodorant.  Do not shave 48 hours prior to surgery. Men may shave face and neck.  Do not bring valuables to the hospital.  Contacts, dentures or bridgework may not be worn into surgery.  Leave suitcase in the car. After surgery it may be brought to your room.  For patients admitted to the hospital, checkout time is 11:00 AM the day of discharge.   Patients discharged the day of surgery will not be allowed to drive home.    Special Instructions: Shower using CHG night before surgery and shower the day of surgery use CHG.  Use special wash - you have one bottle of CHG for all showers.  You should use approximately 1/2 of the bottle for each shower.  How to Use Chlorhexidine Before Surgery Chlorhexidine gluconate (CHG) is a germ-killing (antiseptic) solution that is used to clean the skin. It can get rid of the bacteria that normally live on the skin and can keep them away for about 24 hours. To clean your skin with CHG, you may be given: A CHG solution to use in the shower or as part of a sponge bath. A prepackaged cloth that contains CHG. Cleaning your skin with CHG may help lower the risk for infection: While you are staying in the intensive care unit of the hospital. If you have a vascular access, such as a central line, to provide short-term or long-term access to your veins. If you have a catheter to drain  urine from your bladder. If you are on a ventilator. A ventilator is a machine that helps you breathe by moving air in and out of your lungs. After surgery. What are the risks? Risks of using CHG include: A skin reaction. Hearing loss, if CHG gets in your ears and you have a perforated eardrum. Eye injury, if CHG gets in your eyes and is not rinsed out. The CHG product catching fire. Make sure that you avoid smoking and flames after applying CHG to your skin. Do not use CHG: If you have a chlorhexidine allergy or have previously reacted to chlorhexidine. On babies younger than 67 months of age. How to use CHG solution Use CHG only as told by your health care provider, and follow the instructions on the label. Use the full amount of CHG as directed. Usually, this is one bottle. During a shower Follow these steps when using CHG solution during a shower (unless your health care provider gives you different instructions): Start the shower. Use your normal soap and shampoo to wash your face and hair. Turn off the shower or move out of the shower stream. Pour the CHG onto a clean washcloth. Do not use any type of brush or rough-edged sponge. Starting at your neck, lather your body down to your toes. Make sure you follow these instructions: If you  will be having surgery, pay special attention to the part of your body where you will be having surgery. Scrub this area for at least 1 minute. Do not use CHG on your head or face. If the solution gets into your ears or eyes, rinse them well with water. Avoid your genital area. Avoid any areas of skin that have broken skin, cuts, or scrapes. Scrub your back and under your arms. Make sure to wash skin folds. Let the lather sit on your skin for 1-2 minutes or as long as told by your health care provider. Thoroughly rinse your entire body in the shower. Make sure that all body creases and crevices are rinsed well. Dry off with a clean towel. Do not put  any substances on your body afterward--such as powder, lotion, or perfume--unless you are told to do so by your health care provider. Only use lotions that are recommended by the manufacturer. Put on clean clothes or pajamas. If it is the night before your surgery, sleep in clean sheets.  During a sponge bath Follow these steps when using CHG solution during a sponge bath (unless your health care provider gives you different instructions): Use your normal soap and shampoo to wash your face and hair. Pour the CHG onto a clean washcloth. Starting at your neck, lather your body down to your toes. Make sure you follow these instructions: If you will be having surgery, pay special attention to the part of your body where you will be having surgery. Scrub this area for at least 1 minute. Do not use CHG on your head or face. If the solution gets into your ears or eyes, rinse them well with water. Avoid your genital area. Avoid any areas of skin that have broken skin, cuts, or scrapes. Scrub your back and under your arms. Make sure to wash skin folds. Let the lather sit on your skin for 1-2 minutes or as long as told by your health care provider. Using a different clean, wet washcloth, thoroughly rinse your entire body. Make sure that all body creases and crevices are rinsed well. Dry off with a clean towel. Do not put any substances on your body afterward--such as powder, lotion, or perfume--unless you are told to do so by your health care provider. Only use lotions that are recommended by the manufacturer. Put on clean clothes or pajamas. If it is the night before your surgery, sleep in clean sheets. How to use CHG prepackaged cloths Only use CHG cloths as told by your health care provider, and follow the instructions on the label. Use the CHG cloth on clean, dry skin. Do not use the CHG cloth on your head or face unless your health care provider tells you to. When washing with the CHG cloth: Avoid  your genital area. Avoid any areas of skin that have broken skin, cuts, or scrapes. Before surgery Follow these steps when using a CHG cloth to clean before surgery (unless your health care provider gives you different instructions): Using the CHG cloth, vigorously scrub the part of your body where you will be having surgery. Scrub using a back-and-forth motion for 3 minutes. The area on your body should be completely wet with CHG when you are done scrubbing. Do not rinse. Discard the cloth and let the area air-dry. Do not put any substances on the area afterward, such as powder, lotion, or perfume. Put on clean clothes or pajamas. If it is the night before your surgery, sleep in clean  sheets.  For general bathing Follow these steps when using CHG cloths for general bathing (unless your health care provider gives you different instructions). Use a separate CHG cloth for each area of your body. Make sure you wash between any folds of skin and between your fingers and toes. Wash your body in the following order, switching to a new cloth after each step: The front of your neck, shoulders, and chest. Both of your arms, under your arms, and your hands. Your stomach and groin area, avoiding the genitals. Your right leg and foot. Your left leg and foot. The back of your neck, your back, and your buttocks. Do not rinse. Discard the cloth and let the area air-dry. Do not put any substances on your body afterward--such as powder, lotion, or perfume--unless you are told to do so by your health care provider. Only use lotions that are recommended by the manufacturer. Put on clean clothes or pajamas. Contact a health care provider if: Your skin gets irritated after scrubbing. You have questions about using your solution or cloth. You swallow any chlorhexidine. Call your local poison control center [phone])671-4464 in the [place] Get help right away if: Your eyes itch badly, or they become very red or  swollen. Your skin itches badly and is red or swollen. Your hearing changes. You have trouble seeing. You have swelling or tingling in your mouth or throat. You have trouble breathing. These symptoms may represent a serious problem that is an emergency. Do not wait to see if the symptoms will go away. Get medical help right away. Call your local emergency services (911 in the [place] Do not drive yourself to the hospital. Summary Chlorhexidine gluconate (CHG) is a germ-killing (antiseptic) solution that is used to clean the skin. Cleaning your skin with CHG may help to lower your risk for infection. You may be given CHG to use for bathing. It may be in a bottle or in a prepackaged cloth to use on your skin. Carefully follow your health care provider's instructions and the instructions on the product label. Do not use CHG if you have a chlorhexidine allergy. Contact your health care provider if your skin gets irritated after scrubbing. This information is not intended to replace advice given to you by your health care provider. Make sure you discuss any questions you have with your health care provider. Document Revised: 05/24/2021 Document Reviewed: 04/06/2020 Elsevier Patient Education  2023 [employer]  Minimally Invasive Cholecystectomy, Care After The following information offers guidance on how to care for yourself after your procedure. Your health care provider may also give you more specific instructions. If you have problems or questions, contact your health care provider. What can I expect after the procedure? After the procedure, it is common to have: Pain at your incision sites. You will be given medicines to control this pain. Mild nausea or vomiting. Bloating and possible shoulder pain from the gas that was used during the procedure. Follow these instructions at home: Medicines Take over-the-counter and prescription medicines only as told by your health care provider. If you  were prescribed an antibiotic medicine, take it as told by your health care provider. Do not stop using the antibiotic even if you start to feel better. Ask your health care provider if the medicine prescribed to you: Requires you to avoid driving or using machinery. Can cause constipation. You may need to take these actions to prevent or treat constipation: Drink enough fluid to keep your urine pale yellow.  Take over-the-counter or prescription medicines. Eat foods that are high in fiber, such as beans, whole grains, and fresh fruits and vegetables. Limit foods that are high in fat and processed sugars, such as fried or sweet foods. Incision care  Follow instructions from your health care provider about how to take care of your incisions. Make sure you: Wash your hands with soap and water for at least 20 seconds before and after you change your bandage (dressing). If soap and water are not available, use hand sanitizer. Change your dressing as told by your health care provider. Leave stitches (sutures), skin glue, or adhesive strips in place. These skin closures may need to be in place for 2 weeks or longer. If adhesive strip edges start to loosen and curl up, you may trim the loose edges. Do not remove adhesive strips completely unless your health care provider tells you to do that. Do not take baths, swim, or use a hot tub until your health care provider approves. Ask your health care provider if you may take showers. You may only be allowed to take sponge baths. Check your incision area every day for signs of infection. Check for: More redness, swelling, or pain. Fluid or blood. Warmth. Pus or a bad smell. Activity Rest as told by your health care provider. Do not do activities that require a lot of effort. Avoid sitting for a long time without moving. Get up to take short walks every 1-2 hours. This is important to improve blood flow and breathing. Ask for help if you feel weak or  unsteady. Do not lift anything that is heavier than 10 lb (4.5 kg), or the limit that you are told, until your health care provider says that it is safe. Do not play contact sports until your health care provider approves. Do not return to work or school until your health care provider approves. Return to your normal activities as told by your health care provider. Ask your health care provider what activities are safe for you. General instructions If you were given a sedative during the procedure, it can affect you for several hours. Do not drive or operate machinery until your health care provider says that it is safe. Keep all follow-up visits. This is important. Contact a health care provider if: You develop a rash. You have more redness, swelling, or pain around your incisions. You have fluid or blood coming from your incisions. Your incisions feel warm to the touch. You have pus or a bad smell coming from your incisions. You have a fever. One or more of your incisions breaks open. Get help right away if: You have trouble breathing. You have chest pain. You have more pain in your shoulders. You faint or feel dizzy when you stand. You have severe pain in your abdomen. You have nausea or vomiting that lasts for more than one day. You have leg pain that is new or unusual, or if it is localized to one specific spot. These symptoms may represent a serious problem that is an emergency. Do not wait to see if the symptoms will go away. Get medical help right away. Call your local emergency services (911 in the [place] Do not drive yourself to the hospital. Summary After your procedure, it is common to have pain at the incision sites. You may also have nausea or bloating. Follow your health care provider's instructions about medicine, activity restrictions, and caring for your incision areas. Do not do activities that require a lot  of effort. Contact a health care provider if you have a fever  or other signs of infection, such as more redness, swelling, or pain around the incisions. Get help right away if you have chest pain, increasing pain in the shoulders, or trouble breathing. This information is not intended to replace advice given to you by your health care provider. Make sure you discuss any questions you have with your health care provider. Document Revised: 07/28/2020 Document Reviewed: 07/28/2020 Elsevier Patient Education  2024 Else[employer] Inc.  General Anesthesia, Adult, Care After The following information offers guidance on how to care for yourself after your procedure. Your health care provider may also give you more specific instructions. If you have problems or questions, contact your health care provider. What can I expect after the procedure? After the procedure, it is common for people to: Have pain or discomfort at the IV site. Have nausea or vomiting. Have a sore throat or hoarseness. Have trouble concentrating. Feel cold or chills. Feel weak, sleepy, or tired (fatigue). Have soreness and body aches. These can affect parts of the body that were not involved in surgery. Follow these instructions at home: For the time period you were told by your health care provider:  Rest. Do not participate in activities where you could fall or become injured. Do not drive or use machinery. Do not drink alcohol. Do not take sleeping pills or medicines that cause drowsiness. Do not make important decisions or sign legal documents. Do not take care of children on your own. General instructions Drink enough fluid to keep your urine pale yellow. If you have sleep apnea, surgery and certain medicines can increase your risk for breathing problems. Follow instructions from your health care provider about wearing your sleep device: Anytime you are sleeping, including during daytime naps. While taking prescription pain medicines, sleeping medicines, or medicines that make you  drowsy. Return to your normal activities as told by your health care provider. Ask your health care provider what activities are safe for you. Take over-the-counter and prescription medicines only as told by your health care provider. Do not use any products that contain nicotine or tobacco. These products include cigarettes, chewing tobacco, and vaping devices, such as e-cigarettes. These can delay incision healing after surgery. If you need help quitting, ask your health care provider. Contact a health care provider if: You have nausea or vomiting that does not get better with medicine. You vomit every time you eat or drink. You have pain that does not get better with medicine. You cannot urinate or have bloody urine. You develop a skin rash. You have a fever. Get help right away if: You have trouble breathing. You have chest pain. You vomit blood. These symptoms may be an emergency. Get help right away. Call 911. Do not wait to see if the symptoms will go away. Do not drive yourself to the hospital. Summary After the procedure, it is common to have a sore throat, hoarseness, nausea, vomiting, or to feel weak, sleepy, or fatigue. For the time period you were told by your health care provider, do not drive or use machinery. Get help right away if you have difficulty breathing, have chest pain, or vomit blood. These symptoms may be an emergency. This information is not intended to replace advice given to you by your health care provider. Make sure you discuss any questions you have with your health care provider. Document Revised: 04/23/2021 Document Reviewed: 04/23/2021 Elsevier Patient Education  2024 [employer].

## 2022-11-09 ENCOUNTER — Encounter (HOSPITAL_COMMUNITY): Payer: Self-pay

## 2022-11-09 ENCOUNTER — Encounter (HOSPITAL_COMMUNITY)
Admission: RE | Admit: 2022-11-09 | Discharge: 2022-11-09 | Disposition: A | Discharge status: Home / Self Care | Payer: BC Managed Care – PPO | Source: Ambulatory Visit | Attending: General Surgery | Admitting: General Surgery

## 2022-11-09 VITALS — BP 130/88 | HR 90 | Temp 98.2°F | Resp 18 | Ht 63.0 in | Wt 315.0 lb

## 2022-11-09 DIAGNOSIS — K900 Celiac disease: Secondary | ICD-10-CM | POA: Diagnosis not present

## 2022-11-09 DIAGNOSIS — K219 Gastro-esophageal reflux disease without esophagitis: Secondary | ICD-10-CM | POA: Diagnosis not present

## 2022-11-09 DIAGNOSIS — R109 Unspecified abdominal pain: Secondary | ICD-10-CM | POA: Diagnosis not present

## 2022-11-09 DIAGNOSIS — Z01818 Encounter for other preprocedural examination: Secondary | ICD-10-CM | POA: Insufficient documentation

## 2022-11-09 DIAGNOSIS — K828 Other specified diseases of gallbladder: Secondary | ICD-10-CM | POA: Diagnosis not present

## 2022-11-09 DIAGNOSIS — Z6841 Body Mass Index (BMI) 40.0 and over, adult: Secondary | ICD-10-CM | POA: Diagnosis not present

## 2022-11-09 DIAGNOSIS — I10 Essential (primary) hypertension: Secondary | ICD-10-CM | POA: Diagnosis not present

## 2022-11-09 DIAGNOSIS — E119 Type 2 diabetes mellitus without complications: Secondary | ICD-10-CM | POA: Insufficient documentation

## 2022-11-09 DIAGNOSIS — M1990 Unspecified osteoarthritis, unspecified site: Secondary | ICD-10-CM | POA: Diagnosis not present

## 2022-11-09 DIAGNOSIS — R9431 Abnormal electrocardiogram [ECG] [EKG]: Secondary | ICD-10-CM | POA: Insufficient documentation

## 2022-11-09 DIAGNOSIS — K7581 Nonalcoholic steatohepatitis (NASH): Secondary | ICD-10-CM | POA: Diagnosis not present

## 2022-11-09 DIAGNOSIS — K811 Chronic cholecystitis: Secondary | ICD-10-CM | POA: Insufficient documentation

## 2022-11-09 DIAGNOSIS — R0602 Shortness of breath: Secondary | ICD-10-CM | POA: Diagnosis not present

## 2022-11-09 DIAGNOSIS — E039 Hypothyroidism, unspecified: Secondary | ICD-10-CM | POA: Diagnosis not present

## 2022-11-09 HISTORY — DX: Thyrotoxicosis, unspecified without thyrotoxic crisis or storm: E05.90

## 2022-11-09 HISTORY — DX: Dyspnea, unspecified: R06.00

## 2022-11-09 LAB — COMPREHENSIVE METABOLIC PANEL
ALT: 34 U/L (ref 0–44)
AST: 29 U/L (ref 15–41)
Albumin: 3.6 g/dL (ref 3.5–5.0)
Alkaline Phosphatase: 71 U/L (ref 38–126)
Anion gap: 10 (ref 5–15)
BUN: 13 mg/dL (ref 6–20)
CO2: 28 mmol/L (ref 22–32)
Calcium: 8.9 mg/dL (ref 8.9–10.3)
Chloride: 100 mmol/L (ref 98–111)
Creatinine, Ser: 0.97 mg/dL (ref 0.44–1.00)
GFR, Estimated: >60 mL/min (ref >60)
Glucose, Bld: 195 mg/dL — ABNORMAL HIGH (ref 70–99)
Potassium: 4 mmol/L (ref 3.5–5.1)
Sodium: 138 mmol/L (ref 135–145)
Total Bilirubin: 0.4 mg/dL (ref 0.3–1.2)
Total Protein: 7.3 g/dL (ref 6.5–8.1)

## 2022-11-09 LAB — HEMOGLOBIN A1C
Hgb A1c MFr Bld: 7.6 % — ABNORMAL HIGH (ref 4.8–5.6)
Mean Plasma Glucose: 171.42 mg/dL

## 2022-11-09 LAB — POCT PREGNANCY, URINE: Preg Test, Ur: NEGATIVE

## 2022-11-10 ENCOUNTER — Other Ambulatory Visit: Payer: BC Managed Care – PPO

## 2022-11-10 NOTE — Anesthesia Preprocedure Evaluation (Addendum)
Anesthesia Evaluation  Patient identified by MRN, date of birth, ID band Patient awake    Reviewed: Allergy & Precautions, NPO status , Patient's Chart, lab work & pertinent test results  History of Anesthesia Complications (+) history of anesthetic complications  Airway Mallampati: III  TM Distance: >3 FB Neck ROM: Full    Dental no notable dental hx. (+) Partial Lower, Dental Advisory Given   Pulmonary shortness of breath   Pulmonary exam normal breath sounds clear to auscultation       Cardiovascular hypertension (borderline), negative cardio ROS Normal cardiovascular exam Rhythm:Regular Rate:Normal     Neuro/Psych negative neurological ROS  negative psych ROS   GI/Hepatic ,GERD  ,,abdominal pain, pos celiac screen   Endo/Other  diabetes, Type 2Hypothyroidism  Morbid obesity  Renal/GU      Musculoskeletal  (+) Arthritis , Osteoarthritis,    Abdominal  (+) + obese  Peds  Hematology   Anesthesia Other Findings   Reproductive/Obstetrics                             Anesthesia Physical Anesthesia Plan  ASA: 4  Anesthesia Plan: General   Post-op Pain Management: Dilaudid IV   Induction: Intravenous  PONV Risk Score and Plan: 2 and Ondansetron, Dexamethasone, Midazolam and Scopolamine patch - Pre-op  Airway Management Planned: Oral ETT and Video Laryngoscope Planned  Additional Equipment:   Intra-op Plan:   Post-operative Plan: Extubation in OR  Informed Consent: I have reviewed the patients History and Physical, chart, labs and discussed the procedure including the risks, benefits and alternatives for the proposed anesthesia with the patient or authorized representative who has indicated his/her understanding and acceptance.     Dental advisory given  Plan Discussed with: CRNA  Anesthesia Plan Comments:         Anesthesia Quick Evaluation

## 2022-11-11 ENCOUNTER — Ambulatory Visit (HOSPITAL_COMMUNITY): Payer: BC Managed Care – PPO | Admitting: Anesthesiology

## 2022-11-11 ENCOUNTER — Encounter (HOSPITAL_COMMUNITY)
Admission: RE | Disposition: A | Discharge status: Home / Self Care | Payer: Self-pay | Source: Home / Self Care | Attending: General Surgery

## 2022-11-11 ENCOUNTER — Ambulatory Visit (HOSPITAL_COMMUNITY)
Admission: RE | Admit: 2022-11-11 | Discharge: 2022-11-11 | Disposition: A | Discharge status: Home / Self Care | Payer: BC Managed Care – PPO | Attending: General Surgery | Admitting: General Surgery

## 2022-11-11 DIAGNOSIS — E039 Hypothyroidism, unspecified: Secondary | ICD-10-CM | POA: Insufficient documentation

## 2022-11-11 DIAGNOSIS — M1990 Unspecified osteoarthritis, unspecified site: Secondary | ICD-10-CM | POA: Insufficient documentation

## 2022-11-11 DIAGNOSIS — E119 Type 2 diabetes mellitus without complications: Secondary | ICD-10-CM | POA: Insufficient documentation

## 2022-11-11 DIAGNOSIS — R0602 Shortness of breath: Secondary | ICD-10-CM | POA: Insufficient documentation

## 2022-11-11 DIAGNOSIS — K760 Fatty (change of) liver, not elsewhere classified: Secondary | ICD-10-CM | POA: Diagnosis not present

## 2022-11-11 DIAGNOSIS — R109 Unspecified abdominal pain: Secondary | ICD-10-CM | POA: Insufficient documentation

## 2022-11-11 DIAGNOSIS — K811 Chronic cholecystitis: Secondary | ICD-10-CM | POA: Insufficient documentation

## 2022-11-11 DIAGNOSIS — K828 Other specified diseases of gallbladder: Secondary | ICD-10-CM | POA: Insufficient documentation

## 2022-11-11 DIAGNOSIS — R1011 Right upper quadrant pain: Secondary | ICD-10-CM | POA: Diagnosis present

## 2022-11-11 DIAGNOSIS — I10 Essential (primary) hypertension: Secondary | ICD-10-CM | POA: Insufficient documentation

## 2022-11-11 DIAGNOSIS — K900 Celiac disease: Secondary | ICD-10-CM | POA: Insufficient documentation

## 2022-11-11 DIAGNOSIS — K219 Gastro-esophageal reflux disease without esophagitis: Secondary | ICD-10-CM | POA: Insufficient documentation

## 2022-11-11 DIAGNOSIS — K7581 Nonalcoholic steatohepatitis (NASH): Secondary | ICD-10-CM | POA: Insufficient documentation

## 2022-11-11 DIAGNOSIS — Z6841 Body Mass Index (BMI) 40.0 and over, adult: Secondary | ICD-10-CM | POA: Insufficient documentation

## 2022-11-11 HISTORY — PX: LIVER BIOPSY: SHX301

## 2022-11-11 LAB — GLUCOSE, CAPILLARY
Glucose-Capillary: 146 mg/dL — ABNORMAL HIGH (ref 70–99)
Glucose-Capillary: 186 mg/dL — ABNORMAL HIGH (ref 70–99)

## 2022-11-11 SURGERY — CHOLECYSTECTOMY, ROBOT-ASSISTED, LAPAROSCOPIC
Anesthesia: General | Site: Abdomen

## 2022-11-11 MED ORDER — SEVOFLURANE IN SOLN
RESPIRATORY_TRACT | Status: AC
  Filled 2022-11-11: qty 250

## 2022-11-11 MED ORDER — BUPIVACAINE HCL (PF) 0.5 % IJ SOLN
INTRAMUSCULAR | Status: DC | PRN
  Administered 2022-11-11: 30 mL

## 2022-11-11 MED ORDER — INDOCYANINE GREEN 25 MG IV SOLR
INTRAVENOUS | Status: AC
  Filled 2022-11-11: qty 10

## 2022-11-11 MED ORDER — CHLORHEXIDINE GLUCONATE CLOTH 2 % EX PADS: 6.0000 | MEDICATED_PAD | Freq: Once | CUTANEOUS | Status: DC

## 2022-11-11 MED ORDER — LIDOCAINE HCL (PF) 2 % IJ SOLN
INTRAMUSCULAR | Status: AC
  Filled 2022-11-11: qty 5

## 2022-11-11 MED ORDER — BUPIVACAINE HCL (PF) 0.5 % IJ SOLN
INTRAMUSCULAR | Status: AC
  Filled 2022-11-11: qty 30

## 2022-11-11 MED ORDER — LIDOCAINE HCL (CARDIAC) PF 100 MG/5ML IV SOSY
PREFILLED_SYRINGE | INTRAVENOUS | Status: DC | PRN
  Administered 2022-11-11: 100 mg via INTRATRACHEAL

## 2022-11-11 MED ORDER — ONDANSETRON HCL 4 MG/2ML IJ SOLN
INTRAMUSCULAR | Status: AC
  Filled 2022-11-11: qty 2

## 2022-11-11 MED ORDER — PROPOFOL 10 MG/ML IV BOLUS
INTRAVENOUS | Status: AC
  Filled 2022-11-11: qty 20

## 2022-11-11 MED ORDER — DEXAMETHASONE SODIUM PHOSPHATE 10 MG/ML IJ SOLN
INTRAMUSCULAR | Status: AC
  Filled 2022-11-11: qty 1

## 2022-11-11 MED ORDER — HYDROMORPHONE HCL 1 MG/ML IJ SOLN
INTRAMUSCULAR | Status: AC
  Filled 2022-11-11: qty 1

## 2022-11-11 MED ORDER — LACTATED RINGERS IV SOLN: INTRAVENOUS | Status: DC

## 2022-11-11 MED ORDER — SCOPOLAMINE 1 MG/3DAYS TD PT72
MEDICATED_PATCH | TRANSDERMAL | Status: AC
  Filled 2022-11-11: qty 1

## 2022-11-11 MED ORDER — SODIUM CHLORIDE 0.9 % IV SOLN
2.0000 g | INTRAVENOUS | Status: AC
  Administered 2022-11-11: 2 g via INTRAVENOUS
  Filled 2022-11-11: qty 2

## 2022-11-11 MED ORDER — ONDANSETRON HCL 4 MG PO TABS: 4.0000 mg | ORAL_TABLET | Freq: Three times a day (TID) | ORAL | 1 refills | Status: AC | PRN

## 2022-11-11 MED ORDER — PROPOFOL 10 MG/ML IV BOLUS
INTRAVENOUS | Status: DC | PRN
  Administered 2022-11-11: 260 mg via INTRAVENOUS

## 2022-11-11 MED ORDER — FENTANYL CITRATE (PF) 100 MCG/2ML IJ SOLN
INTRAMUSCULAR | Status: AC
  Filled 2022-11-11: qty 2

## 2022-11-11 MED ORDER — ONDANSETRON HCL 4 MG/2ML IJ SOLN
INTRAMUSCULAR | Status: DC | PRN
  Administered 2022-11-11: 8 mg via INTRAVENOUS

## 2022-11-11 MED ORDER — INDOCYANINE GREEN 25 MG IV SOLR
2.5000 mg | Freq: Once | INTRAVENOUS | Status: AC
  Administered 2022-11-11: 2.5 mg via INTRAVENOUS

## 2022-11-11 MED ORDER — ALBUTEROL SULFATE HFA 108 (90 BASE) MCG/ACT IN AERS
INHALATION_SPRAY | RESPIRATORY_TRACT | Status: AC
  Filled 2022-11-11: qty 6.7

## 2022-11-11 MED ORDER — DEXAMETHASONE SODIUM PHOSPHATE 10 MG/ML IJ SOLN
INTRAMUSCULAR | Status: DC | PRN
  Administered 2022-11-11: 10 mg via INTRAVENOUS

## 2022-11-11 MED ORDER — ONDANSETRON HCL 4 MG/2ML IJ SOLN: 4.0000 mg | Freq: Once | INTRAMUSCULAR | Status: DC | PRN

## 2022-11-11 MED ORDER — KETOROLAC TROMETHAMINE 30 MG/ML IJ SOLN
30.0000 mg | Freq: Once | INTRAMUSCULAR | Status: AC
  Administered 2022-11-11: 30 mg via INTRAVENOUS

## 2022-11-11 MED ORDER — MIDAZOLAM HCL 2 MG/2ML IJ SOLN
INTRAMUSCULAR | Status: AC
  Filled 2022-11-11: qty 2

## 2022-11-11 MED ORDER — CHLORHEXIDINE GLUCONATE 0.12 % MT SOLN
15.0000 mL | Freq: Once | OROMUCOSAL | Status: AC
  Administered 2022-11-11: 15 mL via OROMUCOSAL
  Filled 2022-11-11: qty 15

## 2022-11-11 MED ORDER — DEXMEDETOMIDINE HCL IN NACL 80 MCG/20ML IV SOLN
INTRAVENOUS | Status: AC
  Filled 2022-11-11: qty 20

## 2022-11-11 MED ORDER — ROCURONIUM BROMIDE 10 MG/ML (PF) SYRINGE
PREFILLED_SYRINGE | INTRAVENOUS | Status: AC
  Filled 2022-11-11: qty 10

## 2022-11-11 MED ORDER — ORAL CARE MOUTH RINSE: 15.0000 mL | Freq: Once | OROMUCOSAL | Status: AC

## 2022-11-11 MED ORDER — DEXMEDETOMIDINE HCL IN NACL 80 MCG/20ML IV SOLN
INTRAVENOUS | Status: DC | PRN
Start: 2022-11-11 — End: 2022-11-11
  Administered 2022-11-11: 20 ug via INTRAVENOUS

## 2022-11-11 MED ORDER — OXYCODONE HCL 5 MG PO TABS
5.0000 mg | ORAL_TABLET | ORAL | 0 refills | Status: DC | PRN
Start: 2022-11-11 — End: 2023-05-11

## 2022-11-11 MED ORDER — STERILE WATER FOR IRRIGATION IR SOLN
Status: DC | PRN
  Administered 2022-11-11: 500 mL

## 2022-11-11 MED ORDER — HYDROMORPHONE HCL 1 MG/ML IJ SOLN
INTRAMUSCULAR | Status: DC | PRN
Start: 2022-11-11 — End: 2022-11-11
  Administered 2022-11-11 (×2): .5 mg via INTRAVENOUS

## 2022-11-11 MED ORDER — FENTANYL CITRATE (PF) 100 MCG/2ML IJ SOLN
INTRAMUSCULAR | Status: DC | PRN
  Administered 2022-11-11 (×4): 50 ug via INTRAVENOUS

## 2022-11-11 MED ORDER — KETOROLAC TROMETHAMINE 30 MG/ML IJ SOLN
INTRAMUSCULAR | Status: AC
  Filled 2022-11-11: qty 1

## 2022-11-11 MED ORDER — OXYCODONE HCL 5 MG PO TABS
5.0000 mg | ORAL_TABLET | Freq: Once | ORAL | Status: AC
  Administered 2022-11-11: 5 mg via ORAL
  Filled 2022-11-11: qty 1

## 2022-11-11 MED ORDER — HYDROMORPHONE HCL 1 MG/ML IJ SOLN
0.2500 mg | INTRAMUSCULAR | Status: DC | PRN
  Administered 2022-11-11: 0.5 mg via INTRAVENOUS
  Administered 2022-11-11 (×2): 0.25 mg via INTRAVENOUS
  Filled 2022-11-11 (×2): qty 0.5

## 2022-11-11 MED ORDER — SCOPOLAMINE 1 MG/3DAYS TD PT72
1.0000 | MEDICATED_PATCH | TRANSDERMAL | Status: DC
  Administered 2022-11-11: 1.5 mg via TRANSDERMAL

## 2022-11-11 MED ORDER — MIDAZOLAM HCL 2 MG/2ML IJ SOLN
INTRAMUSCULAR | Status: DC | PRN
  Administered 2022-11-11: 2 mg via INTRAVENOUS

## 2022-11-11 MED ORDER — ROCURONIUM BROMIDE 10 MG/ML (PF) SYRINGE
PREFILLED_SYRINGE | INTRAVENOUS | Status: DC | PRN
  Administered 2022-11-11 (×2): 50 mg via INTRAVENOUS

## 2022-11-11 MED ORDER — SUGAMMADEX SODIUM 200 MG/2ML IV SOLN
INTRAVENOUS | Status: DC | PRN
  Administered 2022-11-11: 300 mg via INTRAVENOUS

## 2022-11-11 MED ORDER — ALBUTEROL SULFATE HFA 108 (90 BASE) MCG/ACT IN AERS
INHALATION_SPRAY | RESPIRATORY_TRACT | Status: DC | PRN
Start: 2022-11-11 — End: 2022-11-11
  Administered 2022-11-11: 8 via RESPIRATORY_TRACT

## 2022-11-11 SURGICAL SUPPLY — 48 items
ADH SKN CLS APL DERMABOND .7 (GAUZE/BANDAGES/DRESSINGS) ×2
APL PRP STRL LF DISP 70% ISPRP (MISCELLANEOUS) ×4
BLADE SURG 15 STRL LF DISP TIS (BLADE) ×2 IMPLANT
BLADE SURG 15 STRL SS (BLADE) ×2
CAUTERY HOOK MNPLR 1.6 DVNC XI (INSTRUMENTS) ×2 IMPLANT
CHLORAPREP W/TINT 26 (MISCELLANEOUS) ×2 IMPLANT
CLIP LIGATING HEM O LOK PURPLE (MISCELLANEOUS) ×2 IMPLANT
COVER LIGHT HANDLE (MISCELLANEOUS) IMPLANT
COVER LIGHT HANDLE STERIS (MISCELLANEOUS) ×4 IMPLANT
COVER TIP SHEARS 8 DVNC (MISCELLANEOUS) IMPLANT
DEFOGGER SCOPE WARMER CLEARIFY (MISCELLANEOUS) IMPLANT
DERMABOND ADVANCED .7 DNX12 (GAUZE/BANDAGES/DRESSINGS) ×2 IMPLANT
DRAPE ARM DVNC X/XI (DISPOSABLE) ×8 IMPLANT
DRAPE COLUMN DVNC XI (DISPOSABLE) ×2 IMPLANT
ELECT REM PT RETURN 9FT ADLT (ELECTROSURGICAL) ×2
ELECTRODE REM PT RTRN 9FT ADLT (ELECTROSURGICAL) ×2 IMPLANT
FORCEPS BPLR R/ABLATION 8 DVNC (INSTRUMENTS) ×2 IMPLANT
FORCEPS PROGRASP DVNC XI (FORCEP) ×2 IMPLANT
GLOVE BIO SURGEON STRL SZ 6.5 (GLOVE) ×4 IMPLANT
GLOVE BIOGEL PI IND STRL 6.5 (GLOVE) ×4 IMPLANT
GLOVE BIOGEL PI IND STRL 7.0 (GLOVE) ×4 IMPLANT
GOWN STRL REUS W/TWL LRG LVL3 (GOWN DISPOSABLE) ×8 IMPLANT
GRASPER SUT TROCAR 14GX15 (MISCELLANEOUS) IMPLANT
IRRIGATOR SUCT 8 DISP DVNC XI (IRRIGATION / IRRIGATOR) IMPLANT
IV NS IRRIG 3000ML ARTHROMATIC (IV SOLUTION) ×2 IMPLANT
KIT TURNOVER KIT A (KITS) ×2 IMPLANT
MANIFOLD NEPTUNE II (INSTRUMENTS) IMPLANT
NDL HYPO 21X1.5 SAFETY (NEEDLE) ×2 IMPLANT
NDL INSUFFLATION 14GA 120MM (NEEDLE) ×2 IMPLANT
NEEDLE HYPO 21X1.5 SAFETY (NEEDLE) ×2
NEEDLE INSUFFLATION 14GA 120MM (NEEDLE) ×2
OBTURATOR OPTICAL STND 8 DVNC (TROCAR) ×2
OBTURATOR OPTICALSTD 8 DVNC (TROCAR) ×2 IMPLANT
PACK LAP CHOLE LZT030E (CUSTOM PROCEDURE TRAY) ×2 IMPLANT
PAD ARMBOARD 7.5X6 YLW CONV (MISCELLANEOUS) ×2 IMPLANT
PENCIL HANDSWITCHING (ELECTRODE) ×2 IMPLANT
POSITIONER HEAD 8X9X4 ADT (SOFTGOODS) ×2 IMPLANT
SCISSORS MNPLR CVD DVNC XI (INSTRUMENTS) IMPLANT
SEAL UNIV 5-12 XI (MISCELLANEOUS) ×8 IMPLANT
SET BASIN LINEN APH (SET/KITS/TRAYS/PACK) ×2 IMPLANT
SET TUBE SMOKE EVAC HIGH FLOW (TUBING) ×2 IMPLANT
SUT MNCRL AB 4-0 PS2 18 (SUTURE) ×4 IMPLANT
SUT VICRYL 0 AB UR-6 (SUTURE) IMPLANT
SYR 30ML LL (SYRINGE) ×2 IMPLANT
SYS RETRIEVAL 5MM INZII UNIV (BASKET) ×2
SYSTEM RETRIEVL 5MM INZII UNIV (BASKET) ×2 IMPLANT
WATER STERILE IRR 500ML POUR (IV SOLUTION) ×2 IMPLANT
YANKAUER SUCT BULB TIP 10FT TU (MISCELLANEOUS) IMPLANT

## 2022-11-11 NOTE — Op Note (Signed)
[facility] Operative Note  11/11/22  Preoperative Diagnosis: Chronic cholecystitis    Postoperative Diagnosis: Chronic cholecystitis, fatty liver   Procedure(s) Performed: Robotic Assisted Laparoscopic Cholecystectomy, wedge liver biopsy    Surgeon: Leatrice Jewels. Henreitta Leber, MD   Assistants: No qualified resident was available    Anesthesia: General endotracheal   Anesthesiologist: Ronelle Nigh, MD    Specimens: Gallbladder   Estimated Blood Loss: Minimal   Blood Replacement: None    Complications: None   Wound Class: Contaminated    Operative Indications: The patient was found to have symptoms for years but no obvious issues on imaging. We discussed that her symptoms could be from the gallbladder and chronic cholecystitis and that we could remove the gallbladder to see if that helped.  We discussed the risk of the procedure including but not limited to bleeding, infection, injury to the common bile duct, bile leak, need for further procedures, chance of subtotal cholecystectomy.   Findings:  Fatty liver, friable  Critical view of safety noted All clips intact at the end of the case Adequate hemostasis   Procedure: Firefly was given in the preoperative area. The patient was taken to the operating room and placed supine. General endotracheal anesthesia was induced. Intravenous antibiotics were  administered per protocol.  An orogastric tube positioned to decompress the stomach. The abdomen was prepared and draped in the usual sterile fashion.   Veress needle was placed at the supraumbilical area and insufflation was started after confirming a positive saline drop test and no immediate increase in abdominal pressure.  After reaching 15 mm, the Veress needle was removed and a 8 mm port was placed via optiview technique umbilical, measuring 20 mm away from the suspected position of the gallbladder.  The abdomen was inspected and no abnormalities or injuries were  found.  Under direct vision, ports were placed in the following locations in a semi curvilinear position around the target of the gallbladder: Two 8 mm ports on the patient's right each having 8cm clearance to the adjacent ports and one 8 mm port placed on the patient's left 8 cm from the umbilical port. Once ports were placed, the table was placed in the reverse Trendelenburg position with the right side up. The Xi platform was brought into the operative field and docked to the ports successfully.  An endoscope was placed through the umbilical port, prograsper through the most lateral right port, forced bipolar to the port just right of the umbilicus, and then a hook cautery in the left port.   The dome of the gallbladder was grasped with prograsp and retracted over the dome of the liver. Adhesions between the gallbladder and omentum, duodenum and transverse colon were lysed via hook cautery. The infundibulum was grasped with the forced bipolar and retracted toward the right lower quadrant. This maneuver exposed Calot's triangle. Firefly was used throughout the dissection to ensure safe visualization of the cystic duct.The peritoneum overlying the gallbladder infundibulum was then dissected and the cystic duct and cystic artery identified.  Critical view of safety with the liver bed clearly visible behind the duct and artery with no additional structures noted.  The cystic duct and cystic artery were doubly clipped and divided close to the gallbladder.    The gallbladder was then dissected from its peritoneal and liver bed attachments by electrocautery. Due to the significant fatty replacement seen on the liver and the friability of the liver (very soft) I opted to take a wedge biopsy to give the  gastroenterologist a better idea of her fatty liver diease. Using cautery a wedge of the liver edge was  resected. Hemostasis was checked prior to removing the hook cautery.  A 5mm Endo Catch bag was then placed  through the left side port and the gallbladder and liver wedge were placed inside.  The [employer] was undocked and moved out of the field. The gallbladder and wedge of the live were removed in the bag.  The specimens was passed off the table. There was no evidence of bleeding from the gallbladder fossa or cystic artery or leakage of the bile from the cystic duct stump. The left port site closed with a 0 vicryl and PMI due to dilation from removing the gallbladder.The abdomen was desufflated and secondary trocars were removed under direct vision.   No bleeding was noted. All skin incisions were closed with subcuticular sutures of 4-0 monocryl and dermabond.   Final inspection revealed acceptable hemostasis. All counts were correct at the end of the case. The patient was awakened from anesthesia and extubated without complication. The OG tube was removed.  The patient went to the PACU in stable condition.   Algis Greenhouse, MD [facility] [address] [place] [place], [place] [address] [phone] (office)

## 2022-11-11 NOTE — Discharge Instructions (Signed)
Discharge Robotic Assisted Laparoscopic Surgery Instructions:  Common Complaints: Right shoulder pain is common after laparoscopic surgery. This is secondary to the gas used in the surgery being trapped under the diaphragm.  Walk to help your body absorb the gas. This will improve in a few days. Pain at the port sites are common, especially the larger port sites. This will improve with time.  Some nausea is common and poor appetite. The main goal is to stay hydrated the first few days after surgery.   Diet/ Activity: Diet as tolerated. You may not have an appetite, but it is important to stay hydrated. Drink 64 ounces of water a day. Your appetite will return with time.  Shower per your regular routine daily.  Do not take hot showers. Take warm showers that are less than 10 minutes. Rest and listen to your body, but do not remain in bed all day.  Walk everyday for at least 15-20 minutes. Deep cough and move around every 1-2 hours in the first few days after surgery.  Do not lift > 10 lbs, perform excessive bending, pushing, pulling, squatting for 1-2 weeks after surgery.  Do not pick at the dermabond glue on your incision sites.  This glue film will remain in place for 1-2 weeks and will start to peel off.  Do not place lotions or balms on your incision unless instructed to specifically by Dr. Cobie Marcoux.   Pain Expectations and Narcotics: -After surgery you will have pain associated with your incisions and this is normal. The pain is muscular and nerve pain, and will get better with time. -You are encouraged and expected to take non narcotic medications like tylenol and ibuprofen (when able) to treat pain as multiple modalities can aid with pain treatment. -Narcotics are only used when pain is severe or there is breakthrough pain. -You are not expected to have a pain score of 0 after surgery, as we cannot prevent pain. A pain score of 3-4 that allows you to be functional, move, walk, and tolerate  some activity is the goal. The pain will continue to improve over the days after surgery and is dependent on your surgery. -Due to [place] law, we are only able to give a certain amount of pain medication to treat post operative pain, and we only give additional narcotics on a patient by patient basis.  -For most laparoscopic surgery, studies have shown that the majority of patients only need 10-15 narcotic pills, and for open surgeries most patients only need 15-20.   -Having appropriate expectations of pain and knowledge of pain management with non narcotics is important as we do not want anyone to become addicted to narcotic pain medication.  -Using ice packs in the first 48 hours and heating pads after 48 hours, wearing an abdominal binder (when recommended), and using over the counter medications are all ways to help with pain management.   -Simple acts like meditation and mindfulness practices after surgery can also help with pain control and research has proven the benefit of these practices.  Medication: Take tylenol and ibuprofen as needed for pain control, alternating every 4-6 hours.  Example:  Tylenol 1000mg @ 6am, 12noon, 6pm, 12midnight (Do not exceed 4000mg of tylenol a day). Ibuprofen 800mg @ 9am, 3pm, 9pm, 3am (Do not exceed 3600mg of ibuprofen a day).  Take Roxicodone for breakthrough pain every 4 hours.  Take Colace for constipation related to narcotic pain medication. If you do not have a bowel movement in 2 days,   take Miralax over the counter.  Drink plenty of water to also prevent constipation.   Contact Information: If you have questions or concerns, please call our office, [phone], Monday- Thursday 8AM-5PM and Friday 8AM-12Noon.  If it is after hours or on the weekend, please call [facility], [phone], [phone], and ask to speak to the surgeon on call for Dr. Ilianna Bown at [place].   

## 2022-11-11 NOTE — Progress Notes (Signed)
[facility]  Updated husband. Due to fatty liver took a liver biopsy.  Phone call in two weeks .  Algis Greenhouse MD

## 2022-11-11 NOTE — Interval H&P Note (Signed)
History and Physical Interval Note:  11/11/2022 7:21 AM  Melissa Schmidt  has presented today for surgery, with the diagnosis of CHRONIC CHOLECYSTITIS.  The various methods of treatment have been discussed with the patient and family. After consideration of risks, benefits and other options for treatment, the patient has consented to  Procedure(s): XI ROBOTIC ASSISTED LAPAROSCOPIC CHOLECYSTECTOMY (N/A) as a surgical intervention.  The patient's history has been reviewed, patient examined, no change in status, stable for surgery.  I have reviewed the patient's chart and labs.  Questions were answered to the patient's satisfaction.     Lucretia Roers

## 2022-11-11 NOTE — Anesthesia Postprocedure Evaluation (Signed)
Anesthesia Post Note  Patient: Melissa Schmidt  Procedure(s) Performed: XI ROBOTIC ASSISTED LAPAROSCOPIC CHOLECYSTECTOMY LIVER BIOPSY (Abdomen)  Patient location during evaluation: PACU Anesthesia Type: General Level of consciousness: awake and alert Pain management: pain level controlled Vital Signs Assessment: post-procedure vital signs reviewed and stable Respiratory status: spontaneous breathing, nonlabored ventilation, respiratory function stable and patient connected to nasal cannula oxygen Cardiovascular status: blood pressure returned to baseline and stable Postop Assessment: no apparent nausea or vomiting Anesthetic complications: no   There were no known notable events for this encounter.   Last Vitals:  Vitals:   11/11/22 1000 11/11/22 1015  BP: (!) 150/100 (!) 145/82  Pulse: 95 90  Resp: 16 11  Temp:  36.7 C  SpO2: 97% 93%    Last Pain:  Vitals:   11/11/22 1015  TempSrc:   PainSc: 6                  Tylan Kinn L Farah Benish

## 2022-11-11 NOTE — Anesthesia Procedure Notes (Addendum)
Procedure Name: Intubation Date/Time: 11/11/2022 7:46 AM  Performed by: Oletha Cruel, CRNAPre-anesthesia Checklist: Patient identified, Emergency Drugs available and Suction available Patient Re-evaluated:Patient Re-evaluated prior to induction Oxygen Delivery Method: Circle system utilized Preoxygenation: Pre-oxygenation with 100% oxygen Induction Type: IV induction Ventilation: Mask ventilation without difficulty Laryngoscope Size: Glidescope and 4 Grade View: Grade I Tube type: Oral Tube size: 7.5 mm Number of attempts: 1 Airway Equipment and Method: Video-laryngoscopy Placement Confirmation: ETT inserted through vocal cords under direct vision, positive ETCO2, CO2 detector and breath sounds checked- equal and bilateral Secured at: 2 cm Tube secured with: Tape Dental Injury: Teeth and Oropharynx as per pre-operative assessment  Comments: Patient claustrophobic. Patient stated that she was comfortable with the use of a nasal cannula prior to induction. Preoxygenation initiated in preoperative area. Nasal cannula 3 liters/minute applied. Nasal cannula remained connected until patient sedated from the start of induction. Face mask applied. Easy mask/ bag ventilation. Atraumatic intubation. Teeth and lips remain in preoperative condition.

## 2022-11-11 NOTE — Transfer of Care (Signed)
Immediate Anesthesia Transfer of Care Note  Patient: Melissa Schmidt  Procedure(s) Performed: XI ROBOTIC ASSISTED LAPAROSCOPIC CHOLECYSTECTOMY  Patient Location: PACU  Anesthesia Type:General  Level of Consciousness: awake, drowsy, and patient cooperative  Airway & Oxygen Therapy: Patient Spontanous Breathing and Patient connected to nasal cannula oxygen  Post-op Assessment: Report given to RN and Post -op Vital signs reviewed and stable  Post vital signs: Reviewed and stable  Last Vitals:  Vitals Value Taken Time  BP 166/102 11/11/22 0930  Temp 37.2 C 11/11/22 0921  Pulse 97 11/11/22 0945  Resp 15 11/11/22 0945  SpO2 96 % 11/11/22 0945  Vitals shown include unfiled device data.  Last Pain:  Vitals:   11/11/22 0930  TempSrc:   PainSc: 8       Patients Stated Pain Goal: 7 (11/11/22 0640)  Complications: No notable events documented.

## 2022-11-15 LAB — SURGICAL PATHOLOGY

## 2022-11-16 ENCOUNTER — Encounter (HOSPITAL_COMMUNITY): Payer: Self-pay | Admitting: General Surgery

## 2022-11-16 NOTE — Progress Notes (Signed)
Let patient know she had chronic cholecystitis and cholesterol, she did have Mildly active steatohepatitis and Melissa Schmidt is Cc'ed on this message.

## 2022-11-24 ENCOUNTER — Ambulatory Visit (INDEPENDENT_AMBULATORY_CARE_PROVIDER_SITE_OTHER): Payer: BC Managed Care – PPO | Admitting: General Surgery

## 2022-11-24 DIAGNOSIS — K811 Chronic cholecystitis: Secondary | ICD-10-CM

## 2022-11-24 NOTE — Progress Notes (Signed)
[facility]  I am calling the patient for post operative evaluation. This is not a billable encounter as it is under the global charges for the surgery.  The patient had a robotic assisted laparoscopic cholecystectomy and liver biopsy on 11/11/22. The patient reports that she is doing fair. The are tolerating a diet, having good pain control, and having regular Bms.  The incisions are healing and the glue is still in place. The patient has no concerns.   Pathology: FINAL MICROSCOPIC DIAGNOSIS:   A. GALLBLADDER, CHOLECYSTECTOMY:  - Chronic cholecystitis with cholesterolosis   B. LIVER, BIOPSY:  - Mildly active steatohepatitis (grade 1 of 3), see comment  - No pathologic fibrosis   Will see the patient PRN.   Algis Greenhouse, MD [facility] [address] [place], [place] [address] [phone] (office)

## 2022-11-30 ENCOUNTER — Ambulatory Visit: Payer: BC Managed Care – PPO | Attending: Internal Medicine | Admitting: Internal Medicine

## 2022-11-30 ENCOUNTER — Encounter: Payer: Self-pay | Admitting: Internal Medicine

## 2022-11-30 VITALS — BP 122/80 | HR 110 | Ht 63.0 in | Wt 314.0 lb

## 2022-11-30 DIAGNOSIS — E039 Hypothyroidism, unspecified: Secondary | ICD-10-CM

## 2022-11-30 DIAGNOSIS — R000 Tachycardia, unspecified: Secondary | ICD-10-CM

## 2022-11-30 DIAGNOSIS — Z136 Encounter for screening for cardiovascular disorders: Secondary | ICD-10-CM

## 2022-11-30 NOTE — Patient Instructions (Addendum)
Medication Instructions:  Your physician recommends that you continue on your current medications as directed. Please refer to the Current Medication list given to you today.   Labwork: T3 and T4 to be at [facility] or [facility]   Testing/Procedures: None  Follow-Up: Your physician recommends that you schedule a follow-up appointment in: 1 year. You will receive a reminder call in about 8 months reminding you to schedule your appointment. If you don't receive this call, please contact our office.   Any Other Special Instructions Will Be Listed Below (If Applicable).  Thank you for choosing [facility]!      If you need a refill on your cardiac medications before your next appointment, please call your pharmacy.

## 2022-12-01 DIAGNOSIS — R000 Tachycardia, unspecified: Secondary | ICD-10-CM | POA: Insufficient documentation

## 2022-12-01 NOTE — Progress Notes (Signed)
Cardiology Office Note  Date: 12/01/2022   ID: Melissa Schmidt, DOB 07/21/1975, MRN [id number]  PCP:  Arlina Robes, MD  Cardiologist:  Marjo Bicker, MD Electrophysiologist:  None   History of Present Illness: Melissa Schmidt is a 47 y.o. female known to have Graves' disease s/p RAI ablation c/w hypothyroidism, type II DM 2 (HbA1c 6.5 around 6 months ago), hypertriglyceridemia was referred to cardiology clinic to establish care.  She was previously seeing a cardiologist at [facility] but her cardiologist left on she had to find a new cardiologist establish care with.  I do not have any records to review.  She noticed her heart rates elevated in the last 1 month after her levothyroxine dose was increased.  Asymptomatic, no palpitations.  No dizziness, presyncope and syncope.  No angina, DOE (has stable SOB), orthopnea or PND.  No leg swelling.   Past Medical History:  Diagnosis Date   Arthritis    Borderline diabetes    Celiac disease    Complication of anesthesia    shaking post anesthesia   Diabetes mellitus without complication (HCC)    Dyspnea    Hemorrhoids    Hyperthyroidism    Hypertriglyceridemia    PCOS (polycystic ovarian syndrome)     Past Surgical History:  Procedure Laterality Date   APPENDECTOMY  2002   COLONOSCOPY WITH PROPOFOL N/A 05/25/2015   Dr.Rourk- non-bleeding hemorrhoids, the examination was o/w normal   ESOPHAGEAL DILATION N/A 05/25/2015   Procedure: ESOPHAGEAL DILATION;  Surgeon: Corbin Ade, MD;  Location: [facility]ITE;  Service: Endoscopy;  Laterality: N/A;   ESOPHAGOGASTRODUODENOSCOPY  04/2017   [place]: EGD with irregular Z-line s/p biopsy, benign-appearing esophageal stenosis s/p dilation, normal examined duodenum but no biopsies of duodenum. Pathology with junctional type GE mucosa with chronic inflammation and surface erosion, no metaplasia, negative dysplasia.    ESOPHAGOGASTRODUODENOSCOPY (EGD) WITH PROPOFOL N/A 05/25/2015    Dr.Rourk- normal esophagus, dilated, gastric erosions, intact fundoplication, normal third portion of the duodenum, non-bleeding erosive gastropathy. duodenum bx= benign small bowel mucosa with patchy increased intraepithelial lymphocytes, mild villous blunting and mild crypt hyperplasia, no dysplasia or malignancy stomach bx= erosive gastritis with reactive changes   LIVER BIOPSY N/A 11/11/2022   Procedure: LIVER BIOPSY;  Surgeon: Lucretia Roers, MD;  Location: [facility] ORS;  Service: General;  Laterality: N/A;   NISSEN FUNDOPLICATION  2005    Current Outpatient Medications  Medication Sig Dispense Refill   albuterol (VENTOLIN HFA) 108 (90 Base) MCG/ACT inhaler Inhale 2 puffs into the lungs every 6 (six) hours as needed for shortness of breath.     Ascorbic Acid (VITAMIN C ADULT GUMMIES PO) Take 2 tablets by mouth daily.     Calcium Carb-Cholecalciferol (CALCIUM 1000 + D PO) Take 1 tablet by mouth daily.     Cholecalciferol (VITAMIN D3) 125 MCG (5000 UT) CAPS Take 1 capsule (5,000 Units total) by mouth daily. 90 capsule 0   Dulaglutide (TRULICITY) 1.5 MG/0.5ML SOPN Inject 1.5 mg into the skin once a week. 6 mL 1   ferrous sulfate 325 (65 FE) MG EC tablet Take 325 mg by mouth daily with breakfast.     fluticasone (FLONASE) 50 MCG/ACT nasal spray Place 2 sprays into both nostrils daily as needed for allergies.     hydrocortisone (ANUSOL-HC) 2.5 % rectal cream Place 1 application rectally 2 (two) times daily. 30 g 1   levothyroxine (SYNTHROID) 200 MCG tablet TAKE 1 TABLET BY MOUTH ONCE DAILY  BEFORE BREAKFAST 90 tablet 0   levothyroxine (SYNTHROID) 25 MCG tablet This is in addition to the existing rx for 200 mcg to make a total daily dose of 225 mcg. 90 tablet 1   metFORMIN (GLUCOPHAGE) 1000 MG tablet Take 1,000 mg by mouth 2 (two) times daily with a meal.      Multiple Vitamin (MULTIVITAMIN) capsule Take 1 capsule by mouth daily.     ondansetron (ZOFRAN) 4 MG tablet Take 1 tablet (4 mg total) by mouth  every 8 (eight) hours as needed. 30 tablet 1   oxyCODONE (ROXICODONE) 5 MG immediate release tablet Take 1 tablet (5 mg total) by mouth every 4 (four) hours as needed for severe pain or breakthrough pain. 10 tablet 0   pantoprazole (PROTONIX) 40 MG tablet TAKE 1 TABLET BY MOUTH ONCE DAILY BEFORE BREAKFAST 90 tablet 3   PARoxetine (PAXIL-CR) 25 MG 24 hr tablet Take 25 mg by mouth daily.     propranolol (INDERAL) 20 MG tablet Take 10 mg by mouth every 6 (six) hours as needed (anxiety).     risperiDONE (RISPERDAL) 0.5 MG tablet Take 0.5 mg by mouth daily.     rosuvastatin (CRESTOR) 5 MG tablet Take 1 tablet (5 mg total) by mouth at bedtime. 90 tablet 1   diazepam (VALIUM) 5 MG tablet Take one tablet by mouth with food one hour prior to procedure. May repeat 30 minutes prior if needed. (Patient not taking: Reported on 11/30/2022) 2 tablet 0   hydrOXYzine (ATARAX) 50 MG tablet Take 50 mg by mouth at bedtime as needed for anxiety. (Patient not taking: Reported on 11/30/2022)     methocarbamol (ROBAXIN) 500 MG tablet Take 1 tablet (500 mg total) by mouth 3 (three) times daily. (Patient not taking: Reported on 11/30/2022) 90 tablet 0   No current facility-administered medications for this visit.   Allergies:  Singulair [montelukast]   Social History: The patient  reports that she has never smoked. She has never used smokeless tobacco. She reports that she does not drink alcohol and does not use drugs.   Family History: The patient's family history includes Colon polyps in her mother; Irritable bowel syndrome in her mother.   ROS:  Please see the history of present illness. Otherwise, complete review of systems is positive for none  All other systems are reviewed and negative.   Physical Exam: VS:  BP 122/80 (BP Location: Left Arm, Patient Position: Sitting, Cuff Size: Normal)   Pulse (!) 110   Ht 5\' 3"  (1.6 m)   Wt (!) 314 lb (142.4 kg)   SpO2 98%   BMI 55.62 kg/m , BMI Body mass index is 55.62  kg/m.  Wt Readings from Last 3 Encounters:  11/30/22 (!) 314 lb (142.4 kg)  11/09/22 (!) 315 lb (142.9 kg)  11/03/22 (!) 315 lb (142.9 kg)    General: Patient appears comfortable at rest. HEENT: Conjunctiva and lids normal, oropharynx clear with moist mucosa. Neck: Supple, no elevated JVP or carotid bruits, no thyromegaly. Lungs: Clear to auscultation, nonlabored breathing at rest. Cardiac: Regular rate and rhythm, no S3 or significant systolic murmur, no pericardial rub. Abdomen: Soft, nontender, no hepatomegaly, bowel sounds present, no guarding or rebound. Extremities: No pitting edema, distal pulses 2+. Skin: Warm and dry. Musculoskeletal: No kyphosis. Neuropsychiatric: Alert and oriented x3, affect grossly appropriate.  Recent Labwork: 09/08/2022: Hemoglobin 12.5; Platelets 295 10/28/2022: TSH 7.220 11/09/2022: ALT 34; AST 29; BUN 13; Creatinine, Ser 0.97; Potassium 4.0; Sodium 138  Component Value Date/Time   CHOL 188 10/28/2022 1637   TRIG 301 (H) 10/28/2022 1637   HDL 31 (L) 10/28/2022 1637   CHOLHDL 6.1 (H) 10/28/2022 1637   LDLCALC 105 (H) 10/28/2022 1637     Assessment and Plan:  Sinus tachycardia: Asymptomatic, this is new.  Levothyroxine dose was increased from 200 to 225 mcg around 1 month ago (due to mildly elevated TSH levels) after which she noticed her HR going up.  Will obtain free T3 and free T4.  She was also followed by [facility] for other cardiac issues, will obtain records.  She is currently on propranolol 10 mg every 6 hours as needed, instructed her to increase the dose if she has palpitations.  Hypertriglyceridemia: I reviewed her lipid panel from 10/2022 that showed TG 301, this could be from hypothyroidism as well.  Will need to repeat the lipid panel.  Aggressive control of DM 2.  Will obtain records from her prior cardiologist.  Continue rosuvastatin 5 mg nightly for now.  Graves' disease s/p RAI ablation c/w hypothyroidism: On levothyroxine  supplements as stated above.  Follows with endocrine.  Type 2 diabetes mellitus: On metformin, follows with endocrine/PCP.   I have spent a total duration of 30 minutes during the prior notes, EKG, labs, face-to-face discussion/counseling of her medical condition, pathophysiology, evaluation, management, ordering labs and documenting the findings in the note.       Medication Adjustments/Labs and Tests Ordered: Current medicines are reviewed at length with the patient today.  Concerns regarding medicines are outlined above.    Disposition:  Follow up  1 year  Signed Dmarius Reeder Verne Spurr, MD, 12/01/2022 9:14 AM    [facility] at [facility] [address] [place], [place], [place] [address]

## 2022-12-13 ENCOUNTER — Ambulatory Visit
Admission: RE | Admit: 2022-12-13 | Discharge: 2022-12-13 | Disposition: A | Discharge status: Home / Self Care | Payer: BC Managed Care – PPO | Source: Ambulatory Visit | Attending: Physical Medicine and Rehabilitation | Admitting: Physical Medicine and Rehabilitation

## 2022-12-13 DIAGNOSIS — M7918 Myalgia, other site: Secondary | ICD-10-CM

## 2022-12-13 DIAGNOSIS — G8929 Other chronic pain: Secondary | ICD-10-CM

## 2022-12-13 DIAGNOSIS — M5416 Radiculopathy, lumbar region: Secondary | ICD-10-CM

## 2023-01-10 ENCOUNTER — Ambulatory Visit: Payer: BC Managed Care – PPO | Admitting: Physical Medicine and Rehabilitation

## 2023-01-11 ENCOUNTER — Telehealth: Payer: Self-pay | Admitting: Physical Medicine and Rehabilitation

## 2023-01-11 NOTE — Telephone Encounter (Signed)
Patient called needing to cancel her appointment due to transportation issues. Patient said she will call back to reschedule when she get her vehicle back.     [phone]

## 2023-01-12 ENCOUNTER — Ambulatory Visit: Payer: BC Managed Care – PPO | Admitting: Physical Medicine and Rehabilitation

## 2023-02-06 ENCOUNTER — Other Ambulatory Visit: Payer: Self-pay | Admitting: "Endocrinology

## 2023-02-06 DIAGNOSIS — E890 Postprocedural hypothyroidism: Secondary | ICD-10-CM

## 2023-02-14 LAB — T4+FREE T4
Free T4 by Dialysis: 0.96 ng/dL
Thyroxine (T4): 9.7 ug/dL

## 2023-02-14 LAB — T3, FREE: T3, Free: 1.8 pg/mL — ABNORMAL LOW (ref 2.0–4.4)

## 2023-03-30 ENCOUNTER — Encounter (INDEPENDENT_AMBULATORY_CARE_PROVIDER_SITE_OTHER): Payer: Self-pay

## 2023-03-30 ENCOUNTER — Ambulatory Visit: Payer: BC Managed Care – PPO | Admitting: Gastroenterology

## 2023-03-31 ENCOUNTER — Telehealth: Payer: Self-pay | Admitting: Gastroenterology

## 2023-03-31 NOTE — Telephone Encounter (Signed)
Left patient a message to reschedule appt from 03/30/23

## 2023-04-12 ENCOUNTER — Encounter (HOSPITAL_COMMUNITY): Payer: Self-pay

## 2023-04-12 ENCOUNTER — Emergency Department (HOSPITAL_COMMUNITY)

## 2023-04-12 ENCOUNTER — Other Ambulatory Visit: Payer: Self-pay

## 2023-04-12 ENCOUNTER — Emergency Department (HOSPITAL_COMMUNITY): Admission: EM | Admit: 2023-04-12 | Discharge: 2023-04-12 | Disposition: A | Discharge status: Home / Self Care

## 2023-04-12 DIAGNOSIS — E039 Hypothyroidism, unspecified: Secondary | ICD-10-CM | POA: Diagnosis not present

## 2023-04-12 DIAGNOSIS — E1165 Type 2 diabetes mellitus with hyperglycemia: Secondary | ICD-10-CM | POA: Diagnosis not present

## 2023-04-12 DIAGNOSIS — Z7984 Long term (current) use of oral hypoglycemic drugs: Secondary | ICD-10-CM | POA: Insufficient documentation

## 2023-04-12 DIAGNOSIS — R739 Hyperglycemia, unspecified: Secondary | ICD-10-CM

## 2023-04-12 DIAGNOSIS — Z79899 Other long term (current) drug therapy: Secondary | ICD-10-CM | POA: Diagnosis not present

## 2023-04-12 LAB — URINALYSIS, ROUTINE W REFLEX MICROSCOPIC
Bilirubin Urine: NEGATIVE
Glucose, UA: >=500 mg/dL — AB
Hgb urine dipstick: NEGATIVE
Ketones, ur: NEGATIVE mg/dL
Leukocytes,Ua: NEGATIVE
Nitrite: NEGATIVE
Protein, ur: NEGATIVE mg/dL
Specific Gravity, Urine: 1.028 (ref 1.005–1.030)
pH: 5 (ref 5.0–8.0)

## 2023-04-12 LAB — COMPREHENSIVE METABOLIC PANEL
ALT: 55 U/L — ABNORMAL HIGH (ref 0–44)
AST: 67 U/L — ABNORMAL HIGH (ref 15–41)
Albumin: 3.6 g/dL (ref 3.5–5.0)
Alkaline Phosphatase: 86 U/L (ref 38–126)
Anion gap: 13 (ref 5–15)
BUN: 15 mg/dL (ref 6–20)
CO2: 21 mmol/L — ABNORMAL LOW (ref 22–32)
Calcium: 9.5 mg/dL (ref 8.9–10.3)
Chloride: 97 mmol/L — ABNORMAL LOW (ref 98–111)
Creatinine, Ser: 0.89 mg/dL (ref 0.44–1.00)
GFR, Estimated: >60 mL/min (ref >60)
Glucose, Bld: 419 mg/dL — ABNORMAL HIGH (ref 70–99)
Potassium: 3.8 mmol/L (ref 3.5–5.1)
Sodium: 131 mmol/L — ABNORMAL LOW (ref 135–145)
Total Bilirubin: 0.4 mg/dL (ref 0.0–1.2)
Total Protein: 8 g/dL (ref 6.5–8.1)

## 2023-04-12 LAB — CBG MONITORING, ED
Glucose-Capillary: 513 mg/dL (ref 70–99)
Glucose-Capillary: 384 mg/dL — ABNORMAL HIGH (ref 70–99)
Glucose-Capillary: 361 mg/dL — ABNORMAL HIGH (ref 70–99)
Glucose-Capillary: 303 mg/dL — ABNORMAL HIGH (ref 70–99)
Glucose-Capillary: 253 mg/dL — ABNORMAL HIGH (ref 70–99)

## 2023-04-12 LAB — CBC
HCT: 45.5 % (ref 36.0–46.0)
Hemoglobin: 14.3 g/dL (ref 12.0–15.0)
MCH: 25.2 pg — ABNORMAL LOW (ref 26.0–34.0)
MCHC: 31.4 g/dL (ref 30.0–36.0)
MCV: 80.1 fL (ref 80.0–100.0)
Platelets: 331 10*3/uL (ref 150–400)
RBC: 5.68 MIL/uL — ABNORMAL HIGH (ref 3.87–5.11)
RDW: 14.2 % (ref 11.5–15.5)
WBC: 10.2 10*3/uL (ref 4.0–10.5)
nRBC: 0 % (ref 0.0–0.2)

## 2023-04-12 LAB — BLOOD GAS, VENOUS
Acid-base deficit: 2.5 mmol/L — ABNORMAL HIGH (ref 0.0–2.0)
Bicarbonate: 22.5 mmol/L (ref 20.0–28.0)
Drawn by: 28144
O2 Saturation: 84.1 %
Patient temperature: 36.7
pCO2, Ven: 38 mmHg — ABNORMAL LOW (ref 44–60)
pH, Ven: 7.37 (ref 7.25–7.43)
pO2, Ven: 52 mmHg — ABNORMAL HIGH (ref 32–45)

## 2023-04-12 LAB — TROPONIN I (HIGH SENSITIVITY)
Troponin I (High Sensitivity): 3 ng/L (ref <18)
Troponin I (High Sensitivity): 3 ng/L (ref <18)

## 2023-04-12 LAB — HCG, QUANTITATIVE, PREGNANCY: hCG, Beta Chain, Quant, S: <1 m[IU]/mL (ref <5)

## 2023-04-12 LAB — BETA-HYDROXYBUTYRIC ACID: Beta-Hydroxybutyric Acid: 0.14 mmol/L (ref 0.05–0.27)

## 2023-04-12 MED ORDER — LACTATED RINGERS IV BOLUS
1000.0000 mL | Freq: Once | INTRAVENOUS | Status: AC
  Administered 2023-04-12: 1000 mL via INTRAVENOUS

## 2023-04-12 MED ORDER — INSULIN ASPART 100 UNIT/ML IJ SOLN
5.0000 [IU] | Freq: Once | INTRAMUSCULAR | Status: AC
  Administered 2023-04-12: 5 [IU] via SUBCUTANEOUS
  Filled 2023-04-12: qty 1

## 2023-04-12 MED ORDER — ACETAMINOPHEN 325 MG PO TABS
650.0000 mg | ORAL_TABLET | Freq: Once | ORAL | Status: AC
  Administered 2023-04-12: 650 mg via ORAL
  Filled 2023-04-12: qty 2

## 2023-04-12 MED ORDER — LABETALOL HCL 5 MG/ML IV SOLN
10.0000 mg | Freq: Once | INTRAVENOUS | Status: DC
  Filled 2023-04-12: qty 4

## 2023-04-12 NOTE — ED Provider Notes (Signed)
 [place] EMERGENCY DEPARTMENT AT [facility] Provider Note   CSN: [id number] Arrival date & time: 04/12/23  1641     History  Chief Complaint  Patient presents with   Hyperglycemia    Melissa Schmidt is a 48 y.o. female.  48 year old female with past medical history of hypothyroidism and diabetes presenting to the emergency department today with polyuria and polydipsia.  The patient states has been going now for the past few weeks.  She states that she has been taking her metformin as well as weekly Trulicity but her blood sugars are very elevated at home.  She reports they have been mostly in the 4 and 500s.  States that she had an A1c drawn yesterday that was greater than 12.  She called her doctor today and states she was having the polyuria and polydipsia as well as some fatigue so she was sent in for further evaluation.  The patient states she has been having some mild, vague chest discomfort now for the past few weeks as well.  She denies a history of DVT or pulmonary embolism, recent surgery, recent travel.  Denies any leg pain or swelling.   Hyperglycemia Associated symptoms: fatigue, increased thirst and polyuria        Home Medications Prior to Admission medications   Medication Sig Start Date End Date Taking? Authorizing Provider  albuterol (VENTOLIN HFA) 108 (90 Base) MCG/ACT inhaler Inhale 2 puffs into the lungs every 6 (six) hours as needed for shortness of breath. 07/27/20   [provider]  Ascorbic Acid (VITAMIN C ADULT GUMMIES PO) Take 2 tablets by mouth daily.    [provider]  Calcium Carb-Cholecalciferol (CALCIUM 1000 + D PO) Take 1 tablet by mouth daily.    [provider]  CAPLYTA 21 MG CAPS Take 1 capsule by mouth daily.    [provider]  Cholecalciferol (VITAMIN D3) 125 MCG (5000 UT) CAPS Take 1 capsule (5,000 Units total) by mouth daily. 12/03/20   Roma Kayser, MD  diazepam (VALIUM) 5 MG tablet Take  one tablet by mouth with food one hour prior to procedure. May repeat 30 minutes prior if needed. Patient not taking: Reported on 11/30/2022 10/13/22   Juanda Chance, NP  DIFLUCAN 150 MG tablet Take 150 mg by mouth as needed (yeast infections). 04/11/23   [provider]  Dulaglutide (TRULICITY) 1.5 MG/0.5ML SOPN Inject 1.5 mg into the skin once a week. Patient taking differently: Inject 1.5 mg into the skin once a week. Wednesday 11/01/22   Roma Kayser, MD  ferrous sulfate 325 (65 FE) MG EC tablet Take 325 mg by mouth daily with breakfast.    [provider]  fluticasone (FLONASE) 50 MCG/ACT nasal spray Place 2 sprays into both nostrils daily as needed for allergies.    [provider]  hydrocortisone (ANUSOL-HC) 2.5 % rectal cream Place 1 application rectally 2 (two) times daily. 12/11/18   Gelene Mink, NP  hydrOXYzine (ATARAX) 50 MG tablet Take 50 mg by mouth at bedtime as needed for anxiety. Patient not taking: Reported on 11/30/2022 10/12/22   [provider]  levothyroxine (SYNTHROID) 200 MCG tablet TAKE 1 TABLET BY MOUTH ONCE DAILY BEFORE BREAKFAST 02/09/23   Roma Kayser, MD  levothyroxine (SYNTHROID) 25 MCG tablet This is in addition to the existing rx for 200 mcg to make a total daily dose of 225 mcg. 11/01/22   Roma Kayser, MD  lurasidone (LATUDA) 80 MG  TABS tablet Take 80 mg by mouth daily with breakfast.    [provider]  metFORMIN (GLUCOPHAGE) 1000 MG tablet Take 1,000 mg by mouth 2 (two) times daily with a meal.     [provider]  methocarbamol (ROBAXIN) 500 MG tablet Take 1 tablet (500 mg total) by mouth 3 (three) times daily. Patient not taking: Reported on 11/30/2022 10/13/22   Juanda Chance, NP  Multiple Vitamin (MULTIVITAMIN) capsule Take 1 capsule by mouth daily.    [provider]  ondansetron (ZOFRAN) 4 MG tablet Take 1 tablet (4 mg total) by mouth every 8 (eight) hours as needed.  11/11/22 11/11/23  Lucretia Roers, MD  oxyCODONE (ROXICODONE) 5 MG immediate release tablet Take 1 tablet (5 mg total) by mouth every 4 (four) hours as needed for severe pain or breakthrough pain. 11/11/22 11/11/23  Lucretia Roers, MD  pantoprazole (PROTONIX) 40 MG tablet TAKE 1 TABLET BY MOUTH ONCE DAILY BEFORE BREAKFAST 12/16/21   Gelene Mink, NP  PARoxetine (PAXIL-CR) 25 MG 24 hr tablet Take 25 mg by mouth daily. 09/28/22   [provider]  propranolol (INDERAL) 20 MG tablet Take 10 mg by mouth every 6 (six) hours as needed (anxiety). 09/28/22   [provider]  risperiDONE (RISPERDAL) 1 MG tablet Take 2 mg by mouth at bedtime.    [provider]  rosuvastatin (CRESTOR) 5 MG tablet Take 1 tablet (5 mg total) by mouth at bedtime. 11/01/22   Roma Kayser, MD  topiramate (TOPAMAX) 25 MG tablet Take 100 mg by mouth daily.    [provider]      Allergies    Singulair [montelukast]    Review of Systems   Review of Systems  Constitutional:  Positive for fatigue.  Endocrine: Positive for polydipsia and polyuria.  All other systems reviewed and are negative.   Physical Exam Updated Vital Signs BP 129/85 (BP Location: Left Arm)   Pulse 85   Temp 98.4 F (36.9 C) (Oral)   Resp 17   Ht 5\' 3"  (1.6 m)   Wt (!) 143 kg   SpO2 98%   BMI 55.85 kg/m  Physical Exam Vitals and nursing note reviewed.   Gen: NAD Eyes: PERRL, EOMI HEENT: no oropharyngeal swelling Neck: trachea midline Resp: clear to auscultation bilaterally Card: RRR, no murmurs, rubs, or gallops Abd: nontender, nondistended Extremities: no calf tenderness, no edema Vascular: 2+ radial pulses bilaterally, 2+ DP pulses bilaterally Neuro: No focal deficits Skin: no rashes Psyc: acting appropriately   ED Results / Procedures / Treatments   Labs (all labs ordered are listed, but only abnormal results are displayed) Labs Reviewed  URINALYSIS, ROUTINE W REFLEX MICROSCOPIC -  Abnormal; Notable for the following components:      Result Value   Glucose, UA >=500 (*)    Bacteria, UA RARE (*)    All other components within normal limits  CBC - Abnormal; Notable for the following components:   RBC 5.68 (*)    MCH 25.2 (*)    All other components within normal limits  COMPREHENSIVE METABOLIC PANEL - Abnormal; Notable for the following components:   Sodium 131 (*)    Chloride 97 (*)    CO2 21 (*)    Glucose, Bld 419 (*)    AST 67 (*)    ALT 55 (*)    All other components within normal limits  BLOOD GAS, VENOUS - Abnormal; Notable for the following components:   pCO2,  Ven 38 (*)    pO2, Ven 52 (*)    Acid-base deficit 2.5 (*)    All other components within normal limits  CBG MONITORING, ED - Abnormal; Notable for the following components:   Glucose-Capillary 513 (*)    All other components within normal limits  CBG MONITORING, ED - Abnormal; Notable for the following components:   Glucose-Capillary 384 (*)    All other components within normal limits  CBG MONITORING, ED - Abnormal; Notable for the following components:   Glucose-Capillary 361 (*)    All other components within normal limits  CBG MONITORING, ED - Abnormal; Notable for the following components:   Glucose-Capillary 303 (*)    All other components within normal limits  CBG MONITORING, ED - Abnormal; Notable for the following components:   Glucose-Capillary 253 (*)    All other components within normal limits  BETA-HYDROXYBUTYRIC ACID  HCG, QUANTITATIVE, PREGNANCY  POC URINE PREG, ED  TROPONIN I (HIGH SENSITIVITY)  TROPONIN I (HIGH SENSITIVITY)    EKG None  Radiology DG Chest Portable 1 View Result Date: 04/12/2023 CLINICAL DATA:  Hyperglycemia with excessive thirst and urinary frequency. EXAM: PORTABLE CHEST 1 VIEW COMPARISON:  None Available. FINDINGS: The heart size and mediastinal contours are within normal limits. Both lungs are clear. The visualized skeletal structures are  unremarkable. IMPRESSION: No active disease. Electronically Signed   By: Aram Candela M.D.   On: 04/12/2023 20:35    Procedures Procedures    Medications Ordered in ED Medications  labetalol (NORMODYNE) injection 10 mg (0 mg Intravenous Hold 04/12/23 1853)  lactated ringers bolus 1,000 mL (0 mLs Intravenous Stopped 04/12/23 1942)  lactated ringers bolus 1,000 mL (0 mLs Intravenous Stopped 04/12/23 2111)  acetaminophen (TYLENOL) tablet 650 mg (650 mg Oral Given 04/12/23 1851)  insulin aspart (novoLOG) injection 5 Units (5 Units Subcutaneous Given 04/12/23 2127)    ED Course/ Medical Decision Making/ A&P                                 Medical Decision Making 48 year old female with past medical history of hypothyroidism and diabetes presenting to the emergency department today with polyuria and polydipsia as well as some intermittent chest discomfort over the past few weeks.  I will further evaluate her here with basic labs as well as an EKG and troponin to further evaluate for ACS, pulmonary edema, pulmonary infiltrates, pneumothorax.  Also obtain basic labs and urinalysis in addition to a venous blood gas to eval for diabetic ketoacidosis.  I will give the patient IV fluids here.  Based on description of her symptoms and duration suspicion for aortic dissection or pulmonary embolism is low at this time.  The patient's labs here today are reassuring.  She does not have findings consistent with DKA at this time.  The patient given fluids with improvement in her glucose and she was feeling better on reassessment.  Troponin is negative.  She is given insulin with further improvement in her glucose and is discharged with return precautions.  Amount and/or Complexity of Data Reviewed Labs: ordered. Radiology: ordered.  Risk OTC drugs. Prescription drug management.           Final Clinical Impression(s) / ED Diagnoses Final diagnoses:  Hyperglycemia    Rx / DC Orders ED Discharge  Orders     None         Durwin Glaze, MD 04/12/23 2227

## 2023-04-12 NOTE — Discharge Instructions (Signed)
 Your workup today was reassuring.  We were able to get your blood sugar down.  Please call your doctor tomorrow to see what kind of changes they would like to make to your diabetes regimen.  You did not appear to be in diabetic ketoacidosis today.  Please drink plenty of sugar-free fluids over the next 24 to 48 hours and follow-up with your doctor.  Return to the ER for worsening symptoms.

## 2023-04-12 NOTE — ED Notes (Signed)
 Pt reports being started on new medication this past August and reports having difficulty controlling her blood sugar since that time.

## 2023-04-12 NOTE — ED Triage Notes (Signed)
 Pt arrived via POV c/o hyperglycemic, excessive thirst and urinary frequency. Pt reports calling her PCP and being advised to seek treatment in the ER.

## 2023-04-12 NOTE — ED Notes (Signed)
 Pts CBG is 513 in Triage

## 2023-04-29 ENCOUNTER — Other Ambulatory Visit: Payer: Self-pay | Admitting: "Endocrinology

## 2023-04-29 DIAGNOSIS — E890 Postprocedural hypothyroidism: Secondary | ICD-10-CM

## 2023-04-29 NOTE — Progress Notes (Unsigned)
 Melissa Schmidt, female    DOB: 1975/05/27    MRN: [id number]   Brief patient profile:  22  yowf  never smoker with seasonal rhinitis seen teenager self referred to pulmonary clinic in [facility]  05/02/2023 for ? AB related to or triggered by covid  2020    History of Present Illness  05/02/2023  Pulmonary/ 1st office eval/ [facility] / [facility] Office  Chief Complaint  Patient presents with   Consult    Having trouble breathing SOB   Dyspnea:  limited by R knee/ steps a problem  Cough: harsh dry cough  Sleep: flat bed / props up with 2 pillows  SABA use: couple times a week  02: none      No obvious day to day or daytime pattern/variability or assoc excess/ purulent sputum or mucus plugs or hemoptysis or cp or chest tightness, subjective wheeze or overt sinus or hb symptoms.    Also denies any obvious fluctuation of symptoms with weather or environmental changes or other aggravating or alleviating factors except as outlined above   No unusual exposure hx or h/o childhood pna/ asthma or knowledge of premature birth.  Current Allergies, Complete Past Medical History, Past Surgical History, Family History, and Social History were reviewed in [employer] record.  ROS  The following are not active complaints unless bolded Hoarseness, sore throat, dysphagia, dental problems, itching, sneezing,  nasal congestion or discharge of excess mucus or purulent secretions, ear ache,   fever, chills, sweats, unintended wt loss or wt gain, classically pleuritic or exertional cp,  orthopnea pnd or arm/hand swelling  or leg swelling, presyncope, palpitations, abdominal pain, anorexia, nausea, vomiting, diarrhea  or change in bowel habits or change in bladder habits, change in stools or change in urine, dysuria, hematuria,  rash, arthralgias, visual complaints, headache, numbness, weakness or ataxia or problems with walking or coordination,  change in mood or  memory.             Outpatient Medications Prior to Visit  Medication Sig Dispense Refill   albuterol (VENTOLIN HFA) 108 (90 Base) MCG/ACT inhaler Inhale 2 puffs into the lungs every 6 (six) hours as needed for shortness of breath.     Ascorbic Acid (VITAMIN C ADULT GUMMIES PO) Take 2 tablets by mouth daily.     Calcium Carb-Cholecalciferol (CALCIUM 1000 + D PO) Take 1 tablet by mouth daily.     Cholecalciferol (VITAMIN D3) 125 MCG (5000 UT) CAPS Take 1 capsule (5,000 Units total) by mouth daily. 90 capsule 0   diazepam (VALIUM) 5 MG tablet Take one tablet by mouth with food one hour prior to procedure. May repeat 30 minutes prior if needed. 2 tablet 0   DIFLUCAN 150 MG tablet Take 150 mg by mouth as needed (yeast infections).     Dulaglutide (TRULICITY) 1.5 MG/0.5ML SOPN Inject 1.5 mg into the skin once a week. (Patient taking differently: Inject 1.5 mg into the skin once a week. Wednesday) 6 mL 1   ferrous sulfate 325 (65 FE) MG EC tablet Take 325 mg by mouth daily with breakfast.     fluticasone (FLONASE) 50 MCG/ACT nasal spray Place 2 sprays into both nostrils daily as needed for allergies.     hydrocortisone (ANUSOL-HC) 2.5 % rectal cream Place 1 application rectally 2 (two) times daily. 30 g 1   hydrOXYzine (ATARAX) 50 MG tablet Take 50 mg by mouth at bedtime as needed for anxiety.     levothyroxine (SYNTHROID)  200 MCG tablet TAKE 1 TABLET BY MOUTH ONCE DAILY BEFORE BREAKFAST 90 tablet 0   levothyroxine (SYNTHROID) 25 MCG tablet This is in addition to the existing rx for 200 mcg to make a total daily dose of 225 mcg. 90 tablet 1   lurasidone (LATUDA) 80 MG TABS tablet Take 80 mg by mouth daily with breakfast.     metFORMIN (GLUCOPHAGE) 1000 MG tablet Take 1,000 mg by mouth 2 (two) times daily with a meal.      methocarbamol (ROBAXIN) 500 MG tablet Take 1 tablet (500 mg total) by mouth 3 (three) times daily. 90 tablet 0   Multiple Vitamin (MULTIVITAMIN) capsule Take 1 capsule by mouth daily.      ondansetron (ZOFRAN) 4 MG tablet Take 1 tablet (4 mg total) by mouth every 8 (eight) hours as needed. 30 tablet 1   oxyCODONE (ROXICODONE) 5 MG immediate release tablet Take 1 tablet (5 mg total) by mouth every 4 (four) hours as needed for severe pain or breakthrough pain. 10 tablet 0   pantoprazole (PROTONIX) 40 MG tablet TAKE 1 TABLET BY MOUTH ONCE DAILY BEFORE BREAKFAST 90 tablet 3   PARoxetine (PAXIL-CR) 25 MG 24 hr tablet Take 25 mg by mouth daily.     propranolol (INDERAL) 20 MG tablet Take 10 mg by mouth every 6 (six) hours as needed (anxiety).     risperiDONE (RISPERDAL) 1 MG tablet Take 2 mg by mouth at bedtime.     rosuvastatin (CRESTOR) 5 MG tablet Take 1 tablet (5 mg total) by mouth at bedtime. 90 tablet 1   topiramate (TOPAMAX) 25 MG tablet Take 100 mg by mouth daily.     CAPLYTA 21 MG CAPS Take 1 capsule by mouth daily.     No facility-administered medications prior to visit.    Past Medical History:  Diagnosis Date   Arthritis    Borderline diabetes    Celiac disease    Complication of anesthesia    shaking post anesthesia   Diabetes mellitus without complication (HCC)    Dyspnea    Hemorrhoids    Hyperthyroidism    Hypertriglyceridemia    PCOS (polycystic ovarian syndrome)       Objective:     BP 128/84   Pulse (!) 105   Ht 5\' 3"  (1.6 m)   Wt (!) 306 lb 6.4 oz (139 kg)   SpO2 94% Comment: room air  BMI 54.28 kg/m   SpO2: 94 % (room air)  Massively obe  hfa  50% (short ti)     Assessment   No problem-specific Assessment & Plan notes found for this encounter.     Sandrea Hughs, MD 05/02/2023

## 2023-05-01 ENCOUNTER — Ambulatory Visit: Payer: BC Managed Care – PPO | Admitting: "Endocrinology

## 2023-05-02 ENCOUNTER — Ambulatory Visit (INDEPENDENT_AMBULATORY_CARE_PROVIDER_SITE_OTHER): Payer: BC Managed Care – PPO | Admitting: Internal Medicine

## 2023-05-02 ENCOUNTER — Encounter: Payer: Self-pay | Admitting: Internal Medicine

## 2023-05-02 VITALS — BP 128/84 | HR 105 | Ht 63.0 in | Wt 306.4 lb

## 2023-05-02 DIAGNOSIS — R0609 Other forms of dyspnea: Secondary | ICD-10-CM | POA: Diagnosis not present

## 2023-05-02 MED ORDER — ALBUTEROL SULFATE HFA 108 (90 BASE) MCG/ACT IN AERS: INHALATION_SPRAY | RESPIRATORY_TRACT | Status: DC

## 2023-05-02 MED ORDER — BUDESONIDE-FORMOTEROL FUMARATE 80-4.5 MCG/ACT IN AERO: INHALATION_SPRAY | RESPIRATORY_TRACT | 12 refills | Status: AC

## 2023-05-02 MED ORDER — BREZTRI AEROSPHERE 160-9-4.8 MCG/ACT IN AERO: 2.0000 | INHALATION_SPRAY | Freq: Two times a day (BID) | RESPIRATORY_TRACT | Status: DC

## 2023-05-02 MED ORDER — ALBUTEROL SULFATE HFA 108 (90 BASE) MCG/ACT IN AERS: INHALATION_SPRAY | RESPIRATORY_TRACT | 11 refills | Status: AC

## 2023-05-02 MED ORDER — FAMOTIDINE 20 MG PO TABS: ORAL_TABLET | ORAL | 11 refills | Status: DC

## 2023-05-02 NOTE — Patient Instructions (Addendum)
 Plan A = Automatic = Always=    Symbicort 80 (Breyna 80 ) Take 2 puffs first thing in am and then another 2 puffs about 12 hours later.    Work on inhaler technique:  relax and gently blow all the way out then take a nice smooth full deep breath back in, triggering the inhaler at same time you start breathing in.  Hold breath in for at least  5 seconds if you can. Blow out symbicort  thru nose. Rinse and gargle with water when done.  If mouth or throat bother you at all,  try brushing teeth/gums/tongue with arm and hammer toothpaste/ make a slurry and gargle and spit out.      Plan B = Backup (to supplement plan A, not to replace it) Only use your Proaire  (albuterol inhaler) as a rescue medication to be used if you can't catch your breath by resting or doing a relaxed purse lip breathing pattern.  - The less you use it, the better it will work when you need it. - Ok to use the inhaler up to 2 puffs  every 4 hours if you must but call for appointment if use goes up over your usual need - Don't leave home without it !!  (think of it like the spare tire for your car)     Pantoprazole (protonix) 40 mg   Take  30-60 min before first meal of the day and Pepcid (famotidine)  20 mg after supper until return to office - this is the best way to tell whether stomach acid is contributing to your problem.     GERD (REFLUX)  is an extremely common cause of respiratory symptoms just like yours , many times with no obvious heartburn at all.    It can be treated with medication, but also with lifestyle changes including elevation of the head of your bed (ideally with 6 -8inch blocks under the headboard of your bed),  Smoking cessation, avoidance of late meals, excessive alcohol, and avoid fatty foods, chocolate, peppermint, colas, red wine, and acidic juices such as orange juice.  NO MINT OR MENTHOL PRODUCTS SO NO COUGH DROPS - LUDENS ok  USE SUGARLESS CANDY INSTEAD (Jolley ranchers or Stover's or Life Savers) or  even ice chips will also do - the key is to swallow to prevent all throat clearing. NO OIL BASED VITAMINS - use powdered substitutes.  Avoid fish oil when coughing.   Please schedule a follow up office visit in 6 weeks, call sooner if needed with all medications /inhalers/ solutions in hand so we can verify exactly what you are taking. This includes all medications from all doctors and over the counters

## 2023-05-03 ENCOUNTER — Encounter: Payer: Self-pay | Admitting: Internal Medicine

## 2023-05-03 NOTE — Assessment & Plan Note (Signed)
 Onset with COVID 2020 with baseline wt 2981 - initial pulmonary eval 05/02/2023 wt 306 limited by knees > sob  - 05/02/2023  After extensive coaching inhaler device,  effectiveness =    50% vs baseline < 25%  :  rec trial of symbicort 80 2bid and max gerd rx  DDX of  difficult airways management almost all start with A and  include Adherence, Ace Inhibitors, Acid Reflux, Active Sinus Disease, Alpha 1 Antitripsin deficiency, Anxiety masquerading as Airways dz,  ABPA,  Allergy(esp in young), Aspiration (esp in elderly), Adverse effects of meds,  Active smoking or vaping, A bunch of PE's (a small clot burden can't cause this syndrome unless there is already severe underlying pulm or vascular dz with poor reserve) plus two Bs  = Bronchiectasis and Beta blocker use..and one C= CHF   Adherence is always the initial "prime suspect" and is a multilayered concern that requires a "trust but verify" approach in every patient - starting with knowing how to use medications, especially inhalers, correctly, keeping up with refills and understanding the fundamental difference between maintenance and prns vs those medications only taken for a very short course and then stopped and not refilled.  - see hfa teaching - return with all meds in hand using a trust but verify approach to confirm accurate Medication  Reconciliation The principal here is that until we are certain that the  patients are doing what we've asked, it makes no sense to ask them to do more.   ? Acid (or non-acid) GERD > always difficult to exclude as up to 75% of pts in some series report no assoc GI/ Heartburn symptoms and she already carries dx of HB > rec max (24h)  acid suppression and diet restrictions/ reviewed and instructions given in writing.   ? Adverse drug effects:  none of the usual suspects listed   ? A bunch of PE's  > she is at risk but nothing to suggest at this point  ? Anxiety/depression/ deconditioning p covid with progressive wt  gain > usually at the bottom of this list of usual suspects but  note already on psychotropics and may interfere with adherence and also interpretation of response or lack thereof to symptom management which can be quite subjective.   ? Chf > nothing to suggest yet    F/u  6 weeks to regroup, will need pfts to sort out obesity vs airway components

## 2023-05-03 NOTE — Assessment & Plan Note (Signed)
 Body mass index is 54.28 kg/m.  -  trending up  Lab Results  Component Value Date   TSH 7.220 (H) 10/28/2022      Contributing to doe and risk of GERD/dvt/PE  >>>   reviewed the need and the process to achieve and maintain neg calorie balance > defer f/u primary care including intermittently monitoring thyroid status             Each maintenance medication was reviewed in detail including emphasizing most importantly the difference between maintenance and prns and under what circumstances the prns are to be triggered using an action plan format where appropriate.  Total time for H and P, chart review, counseling, reviewing hfa device(s) and generating customized AVS unique to this office visit / same day charting = 45 min pt new to me

## 2023-05-10 ENCOUNTER — Telehealth: Payer: Self-pay

## 2023-05-10 NOTE — Telephone Encounter (Signed)
 Copied from CRM [id number]. Topic: Clinical - Prescription Issue >> May 10, 2023  3:50 PM Adele Barthel wrote: Reason for CRM:   Patient is calling in regards to the prescribed budesonide-formoterol (SYMBICORT) 80-4.5 MCG/ACT inhaler. Her insurance will not cover the medication and she had contacted the office a week ago to have alternative medication sent in. She has contacted the pharmacy, and they have still not received the new medication.  Requests call back  CB#   [phone]

## 2023-05-10 NOTE — Telephone Encounter (Signed)
 No documentation in chart of previous request to change medication. Unsure what may have happened.  Multiple attempts to reach [facility] made, after using automated system to request to speak with pharmacy, line just rings until it times out and hangs up.  WCB tomorrow.

## 2023-05-11 ENCOUNTER — Ambulatory Visit (INDEPENDENT_AMBULATORY_CARE_PROVIDER_SITE_OTHER): Admitting: Gastroenterology

## 2023-05-11 ENCOUNTER — Encounter: Payer: Self-pay | Admitting: Gastroenterology

## 2023-05-11 VITALS — BP 139/80 | HR 103 | Temp 97.7°F | Ht 63.0 in | Wt 309.8 lb

## 2023-05-11 DIAGNOSIS — K900 Celiac disease: Secondary | ICD-10-CM

## 2023-05-11 DIAGNOSIS — Z83719 Family history of colon polyps, unspecified: Secondary | ICD-10-CM | POA: Diagnosis not present

## 2023-05-11 DIAGNOSIS — K625 Hemorrhage of anus and rectum: Secondary | ICD-10-CM | POA: Diagnosis not present

## 2023-05-11 DIAGNOSIS — K7581 Nonalcoholic steatohepatitis (NASH): Secondary | ICD-10-CM | POA: Insufficient documentation

## 2023-05-11 MED ORDER — HYDROCORTISONE (PERIANAL) 2.5 % EX CREA: 1.0000 | TOPICAL_CREAM | Freq: Two times a day (BID) | CUTANEOUS | 1 refills | Status: DC

## 2023-05-11 NOTE — Progress Notes (Signed)
 Gastroenterology Office Note     Primary Care Physician:  Serita Danes, MD  Primary Gastroenterologist: Dr. Riley Cheadle    Chief Complaint   Chief Complaint  Patient presents with   Follow-up    Follow up. Having problems with hemorrhoids      History of Present Illness   Melissa Schmidt is a 48 y.o. female presenting today with a history of  celiac disease diagnosed in 2017, chronic GERD, fatty liver with biopsy at time of cholecystectomy on Oct 2024 with mildly active steatohepatitis (Grade 1 of 3), no fibrosis, chronic IDA with worsening iron deficiency 2022 requiring IV iron. Last colonoscopy 2017; she was to have updated TCS/EGD but admits that she was not gluten-free at that time. By the time she was seen in April 2023, IDA had resolved. Last visit Aug 2024.   Celiac: DEXA Sept 2024 normal. Gluten-free. Taking calcium  and Vit D.   Bump in transaminases April 12, 2023 with AST 67, ALT 55. Last Vit D low in Sept 2024. This occurred during episode of uncontrolled blood sugars. Glucose 419 at that time.   Having issues with hemorrhoids. Rectal bleeding with BMs. 5 minutes toilet time. Prolapsing tissue. Grade 2.     Colonoscopy 2017: non-bleeding hemorrhoids, otherwise normal.   FH colon cancer: paternal grandmother FH colon polyps: mother, doesn't know if adenomas.    Past Medical History:  Diagnosis Date   Arthritis    Borderline diabetes    Celiac disease    Complication of anesthesia    shaking post anesthesia   Diabetes mellitus without complication (HCC)    Dyspnea    Hemorrhoids    Hyperthyroidism    Hypertriglyceridemia    PCOS (polycystic ovarian syndrome)     Past Surgical History:  Procedure Laterality Date   APPENDECTOMY  2002   COLONOSCOPY WITH PROPOFOL  N/A 05/25/2015   Dr.Rourk- non-bleeding hemorrhoids, the examination was o/w normal   ESOPHAGEAL DILATION N/A 05/25/2015   Procedure: ESOPHAGEAL DILATION;  Surgeon: Suzette Espy, MD;  Location:  [facility];  Service: Endoscopy;  Laterality: N/A;   ESOPHAGOGASTRODUODENOSCOPY  04/2017   [place]: EGD with irregular Z-line s/p biopsy, benign-appearing esophageal stenosis s/p dilation, normal examined duodenum but no biopsies of duodenum. Pathology with junctional type GE mucosa with chronic inflammation and surface erosion, no metaplasia, negative dysplasia.    ESOPHAGOGASTRODUODENOSCOPY (EGD) WITH PROPOFOL  N/A 05/25/2015   Dr.Rourk- normal esophagus, dilated, gastric erosions, intact fundoplication, normal third portion of the duodenum, non-bleeding erosive gastropathy. duodenum bx= benign small bowel mucosa with patchy increased intraepithelial lymphocytes, mild villous blunting and mild crypt hyperplasia, no dysplasia or malignancy stomach bx= erosive gastritis with reactive changes   LIVER BIOPSY N/A 11/11/2022   Procedure: LIVER BIOPSY;  Surgeon: Awilda Bogus, MD;  Location: [facility];  Service: General;  Laterality: N/A;   NISSEN FUNDOPLICATION  2005    Current Outpatient Medications  Medication Sig Dispense Refill   albuterol  (PROAIR  HFA) 108 (90 Base) MCG/ACT inhaler 2 puffs every 4 hours as needed only  if your can't catch your breath 18 g 11   Ascorbic Acid (VITAMIN C ADULT GUMMIES PO) Take 2 tablets by mouth daily.     budeson-glycopyrrolate -formoterol  (BREZTRI  AEROSPHERE) 160-9-4.8 MCG/ACT AERO Inhale 2 puffs into the lungs in the morning and at bedtime. 1 each    budesonide -formoterol  (SYMBICORT ) 80-4.5 MCG/ACT inhaler Take 2 puffs first thing in am and then another 2 puffs about 12 hours later. 1 each 12  Calcium  Carb-Cholecalciferol (CALCIUM  1000 + D PO) Take 1 tablet by mouth daily.     Cholecalciferol (VITAMIN D3) 125 MCG (5000 UT) CAPS Take 1 capsule (5,000 Units total) by mouth daily. 90 capsule 0   Dulaglutide  (TRULICITY ) 1.5 MG/0.5ML SOPN Inject 1.5 mg into the skin once a week. (Patient taking differently: Inject 1.5 mg into the skin once a week. Wednesday) 6 mL 1    famotidine  (PEPCID ) 20 MG tablet One after supper 30 tablet 11   ferrous sulfate 325 (65 FE) MG EC tablet Take 325 mg by mouth daily with breakfast.     fluticasone (FLONASE) 50 MCG/ACT nasal spray Place 2 sprays into both nostrils daily as needed for allergies.     hydrocortisone  (ANUSOL -HC) 2.5 % rectal cream Place 1 application rectally 2 (two) times daily. 30 g 1   hydrOXYzine (ATARAX) 50 MG tablet Take 50 mg by mouth at bedtime as needed for anxiety.     levothyroxine  (SYNTHROID ) 200 MCG tablet TAKE 1 TABLET BY MOUTH ONCE DAILY BEFORE BREAKFAST 60 tablet 0   levothyroxine  (SYNTHROID ) 25 MCG tablet TAKE 1 TABLET BY MOUTH ONCE DAILY IN ADDITION TO YOUR 200MCG TO MAKE A TOTAL DAILY DOSE OF 225MCG 60 tablet 0   lurasidone (LATUDA) 80 MG TABS tablet Take 80 mg by mouth daily with breakfast.     metFORMIN (GLUCOPHAGE) 1000 MG tablet Take 1,000 mg by mouth 2 (two) times daily with a meal.      methocarbamol  (ROBAXIN ) 500 MG tablet Take 1 tablet (500 mg total) by mouth 3 (three) times daily. 90 tablet 0   Multiple Vitamin (MULTIVITAMIN) capsule Take 1 capsule by mouth daily.     ondansetron  (ZOFRAN ) 4 MG tablet Take 1 tablet (4 mg total) by mouth every 8 (eight) hours as needed. 30 tablet 1   pantoprazole  (PROTONIX ) 40 MG tablet TAKE 1 TABLET BY MOUTH ONCE DAILY BEFORE BREAKFAST 90 tablet 3   PARoxetine (PAXIL-CR) 25 MG 24 hr tablet Take 25 mg by mouth daily.     propranolol  (INDERAL ) 20 MG tablet Take 10 mg by mouth every 6 (six) hours as needed (anxiety).     rosuvastatin  (CRESTOR ) 5 MG tablet Take 1 tablet (5 mg total) by mouth at bedtime. 90 tablet 1   topiramate (TOPAMAX) 25 MG tablet Take 100 mg by mouth daily.     diazepam  (VALIUM ) 5 MG tablet Take one tablet by mouth with food one hour prior to procedure. May repeat 30 minutes prior if needed. (Patient not taking: Reported on 05/11/2023) 2 tablet 0   DIFLUCAN 150 MG tablet Take 150 mg by mouth as needed (yeast infections). (Patient not  taking: Reported on 05/11/2023)     oxyCODONE  (ROXICODONE ) 5 MG immediate release tablet Take 1 tablet (5 mg total) by mouth every 4 (four) hours as needed for severe pain or breakthrough pain. (Patient not taking: Reported on 05/11/2023) 10 tablet 0   risperiDONE (RISPERDAL) 1 MG tablet Take 2 mg by mouth at bedtime. (Patient not taking: Reported on 05/11/2023)     No current facility-administered medications for this visit.    Allergies as of 05/11/2023 - Review Complete 05/11/2023  Allergen Reaction Noted   Singulair [montelukast] Rash 05/31/2019    Family History  Problem Relation Age of Onset   Irritable bowel syndrome Mother    Colon polyps Mother        ? 5 year surveillance   Inflammatory bowel disease Neg Hx    Colon cancer Neg  Hx    Liver disease Neg Hx     Social History   Socioeconomic History   Marital status: Married    Spouse name: Not on file   Number of children: 1   Years of education: Not on file   Highest education level: Not on file  Occupational History   Occupation: in school  Tobacco Use   Smoking status: Never   Smokeless tobacco: Never  Vaping Use   Vaping status: Never Used  Substance and Sexual Activity   Alcohol use: No    Alcohol/week: 0.0 standard drinks of alcohol   Drug use: No   Sexual activity: Yes    Birth control/protection: None  Other Topics Concern   Not on file  Social History Narrative   Not on file   Social Drivers of Health   Financial Resource Strain: Not on file  Food Insecurity: Not on file  Transportation Needs: Not on file  Physical Activity: Not on file  Stress: Not on file  Social Connections: Not on file  Intimate Partner Violence: Not on file     Review of Systems   Gen: Denies any fever, chills, fatigue, weight loss, lack of appetite.  CV: Denies chest pain, heart palpitations, peripheral edema, syncope.  Resp: Denies shortness of breath at rest or with exertion. Denies wheezing or cough.  GI: Denies  dysphagia or odynophagia. Denies jaundice, hematemesis, fecal incontinence. GU : Denies urinary burning, urinary frequency, urinary hesitancy MS: Denies joint pain, muscle weakness, cramps, or limitation of movement.  Derm: Denies rash, itching, dry skin Psych: Denies depression, anxiety, memory loss, and confusion Heme: Denies bruising, bleeding, and enlarged lymph nodes.   Physical Exam   BP 139/80 (BP Location: Right Arm, Patient Position: Sitting, Cuff Size: Large)   Pulse (!) 103   Temp 97.7 F (36.5 C) (Temporal)   Ht 5\' 3"  (1.6 m)   Wt (!) 309 lb 12.8 oz (140.5 kg)   BMI 54.88 kg/m  General:   Alert and oriented. Pleasant and cooperative. Well-nourished and well-developed.  Head:  Normocephalic and atraumatic. Eyes:  Without icterus Abdomen:  +BS, soft, non-tender and non-distended. No HSM noted. No guarding or rebound. No masses appreciated.  Rectal:  DRE without mass, prominent internal hemorrhoids, left lateral, small external hemorrhoids and hemorrhoid tags Msk:  Symmetrical without gross deformities. Normal posture. Extremities:  Without edema. Neurologic:  Alert and  oriented x4;  grossly normal neurologically. Skin:  Intact without significant lesions or rashes. Psych:  Alert and cooperative. Normal mood and affect.   Assessment   Melissa Schmidt is a 48 y.o. female presenting today with a history of celiac disease diagnosed in 2017, chronic GERD, hepatic steatosis, IDA now resolved, presenting with hemorrhoid flare and rectal bleeding.   Celiac disease: following strict gluten-free diet now, on calcium  and Vit D, DEXA uptodate as of Sept 2024 and normal, due for routine labs including iron and Vit D.   Hepatic steatosis: with liver biopsy at time of cholecystectomy on Oct 2024 with mildly active steatohepatitis (Grade 1 of 3), no fibrosis. Bump in transaminases with uncontrolled blood sugars in 400s noted in March, and we will update HFP now and check ELF score. Not a  candidate for Rezdiffra but can follow serially in case of fibrosis in future.   Rectal bleeding: likely due to exacerbated internal hemorrhoids. DRE today without mass and palpable hemorrhoid columns. She is an excellent candidate for banding, which we can go ahead and start. However,  as last colonoscopy in 2017 and mother with polyps (unknown if adenomas), will be proactive and pursue diagnostic colonoscopy in light of rectal bleeding. No first degree relatives with colon cancer but does state paternal grandmother.      PLAN    Proceed with colonoscopy by Dr. Riley Cheadle in near future: the risks, benefits, and alternatives have been discussed with the patient in detail. The patient states understanding and desires to proceed. Will arrange banding in near future Anusol  cream BID Labs to include CBC, iron studies, CMP, Vit D, ELF Continue strict gluten-free diet   Delman Ferns, PhD, [facility] Gastroenterology

## 2023-05-11 NOTE — Patient Instructions (Addendum)
 We are arranging a colonoscopy due to family history of polyps in the near future. I can go ahead and start banding in the meantime! We will make that appointment as well.  You will hold Trulicity one week before colonoscopy, and no metformin day of procedure.  I have sent in Anusol cream to use for hemorrhoids twice a day.   Please have blood work done when you are able.   I will see you back in a few weeks for hemorrhoid banding!  I enjoyed seeing you again today! I value our relationship and want to provide genuine, compassionate, and quality care. You may receive a survey regarding your visit with me, and I welcome your feedback! Thanks so much for taking the time to complete this. I look forward to seeing you again.      Gelene Mink, PhD, ANP-BC [facility]enter[facility]

## 2023-05-12 NOTE — Telephone Encounter (Signed)
 Finally able to reach [facility] pharmacy. They were running rx as Melissa Schmidt, which was requiring PA. When switched to Symbicort it ran with $10 copay. They are preparing rx and will notify patient via text when ready for pick up.

## 2023-05-16 ENCOUNTER — Encounter: Payer: Self-pay | Admitting: *Deleted

## 2023-05-16 MED ORDER — PEG 3350-KCL-NA BICARB-NACL 420 G PO SOLR: 4000.0000 mL | Freq: Once | ORAL | 0 refills | Status: AC

## 2023-06-08 ENCOUNTER — Encounter: Admitting: Gastroenterology

## 2023-06-12 ENCOUNTER — Encounter (HOSPITAL_COMMUNITY)
Admission: RE | Admit: 2023-06-12 | Discharge: 2023-06-12 | Disposition: A | Discharge status: Home / Self Care | Source: Ambulatory Visit | Attending: Internal Medicine | Admitting: Internal Medicine

## 2023-06-12 NOTE — Progress Notes (Deleted)
 Melissa Schmidt, female    DOB: 1975/05/28    MRN: [id number]   Brief patient profile:  55  yowf  never smoker with seasonal rhinitis seen teenager self referred to pulmonary clinic in [facility]  05/02/2023 for ? AB ?  Ever since covid  2020   Wt 281 pre covid 2020   History of Present Illness  05/02/2023  Pulmonary/ 1st office eval/ Aristidis Talerico / [place] Office wt 306  Chief Complaint  Patient presents with   Consult    Having trouble breathing SOB   Dyspnea:  limited by R knee/ steps a big problem  Cough: harsh dry cough  Sleep: flat bed / props up with 2 pillows helps breathing and cough  SABA use: couple times a week seems to help cough but baseline hfa technique < 25 % effective 02: none  Rec   06/13/2023  f/u ov/[place] office/Morine Kohlman re: *** maint on ***  No chief complaint on file.   Dyspnea:  *** Cough: *** Sleeping: ***   resp cc  SABA use: *** 02: ***  Lung cancer screening: ***   No obvious day to day or daytime variability or assoc excess/ purulent sputum or mucus plugs or hemoptysis or cp or chest tightness, subjective wheeze or overt sinus or hb symptoms.    Also denies any obvious fluctuation of symptoms with weather or environmental changes or other aggravating or alleviating factors except as outlined above   No unusual exposure hx or h/o childhood pna/ asthma or knowledge of premature birth.  Current Allergies, Complete Past Medical History, Past Surgical History, Family History, and Social History were reviewed in [employer] record.  ROS  The following are not active complaints unless bolded Hoarseness, sore throat, dysphagia, dental problems, itching, sneezing,  nasal congestion or discharge of excess mucus or purulent secretions, ear ache,   fever, chills, sweats, unintended wt loss or wt gain, classically pleuritic or exertional cp,  orthopnea pnd or arm/hand swelling  or leg swelling, presyncope, palpitations, abdominal pain,  anorexia, nausea, vomiting, diarrhea  or change in bowel habits or change in bladder habits, change in stools or change in urine, dysuria, hematuria,  rash, arthralgias, visual complaints, headache, numbness, weakness or ataxia or problems with walking or coordination,  change in mood or  memory.        No outpatient medications have been marked as taking for the 06/13/23 encounter (Appointment) with Diamond Formica, MD.            Past Medical History:  Diagnosis Date   Arthritis    Borderline diabetes    Celiac disease    Complication of anesthesia    shaking post anesthesia   Diabetes mellitus without complication (HCC)    Dyspnea    Hemorrhoids    Hyperthyroidism    Hypertriglyceridemia    PCOS (polycystic ovarian syndrome)       Objective:    Wts  06/13/2023              ***   05/11/23 (!) 309 lb 12.8 oz (140.5 kg)  05/02/23 (!) 306 lb 6.4 oz (139 kg)  04/12/23 (!) 315 lb 4.1 oz (143 kg)      Vital signs reviewed  06/13/2023  - Note at rest 02 sats  ***% on ***   General appearance:    ***           Labs   reviewed:      Chemistry  Component Value Date/Time   NA 131 (L) 04/12/2023 1720   NA 140 11/05/2018 1551   K 3.8 04/12/2023 1720   CL 97 (L) 04/12/2023 1720   CO2 21 (L) 04/12/2023 1720   BUN 15 04/12/2023 1720   BUN 12 04/15/2020 0000   CREATININE 0.89 04/12/2023 1720      Component Value Date/Time   CALCIUM  9.5 04/12/2023 1720   ALKPHOS 86 04/12/2023 1720   AST 67 (H) 04/12/2023 1720   ALT 55 (H) 04/12/2023 1720   BILITOT 0.4 04/12/2023 1720   BILITOT <0.2 11/05/2018 1551        Lab Results  Component Value Date   TSH 7.220 (H) 10/28/2022         I personally reviewed images and agree with radiology impression as follows:  CXR:   portable  04/12/23  No active dz    Assessment

## 2023-06-13 ENCOUNTER — Ambulatory Visit: Admitting: Internal Medicine

## 2023-06-14 ENCOUNTER — Encounter (HOSPITAL_COMMUNITY): Payer: Self-pay | Admitting: Internal Medicine

## 2023-06-14 ENCOUNTER — Ambulatory Visit (HOSPITAL_COMMUNITY)
Admission: RE | Admit: 2023-06-14 | Discharge: 2023-06-14 | Disposition: A | Discharge status: Home / Self Care | Attending: Internal Medicine | Admitting: Internal Medicine

## 2023-06-14 ENCOUNTER — Other Ambulatory Visit: Payer: Self-pay

## 2023-06-14 ENCOUNTER — Ambulatory Visit (HOSPITAL_COMMUNITY): Admitting: Anesthesiology

## 2023-06-14 ENCOUNTER — Encounter (HOSPITAL_COMMUNITY)
Admission: RE | Disposition: A | Discharge status: Home / Self Care | Payer: Self-pay | Source: Home / Self Care | Attending: Internal Medicine

## 2023-06-14 DIAGNOSIS — Z79899 Other long term (current) drug therapy: Secondary | ICD-10-CM | POA: Diagnosis not present

## 2023-06-14 DIAGNOSIS — K644 Residual hemorrhoidal skin tags: Secondary | ICD-10-CM

## 2023-06-14 DIAGNOSIS — I10 Essential (primary) hypertension: Secondary | ICD-10-CM | POA: Diagnosis not present

## 2023-06-14 DIAGNOSIS — E6689 Other obesity not elsewhere classified: Secondary | ICD-10-CM | POA: Insufficient documentation

## 2023-06-14 DIAGNOSIS — K219 Gastro-esophageal reflux disease without esophagitis: Secondary | ICD-10-CM | POA: Diagnosis not present

## 2023-06-14 DIAGNOSIS — Z7951 Long term (current) use of inhaled steroids: Secondary | ICD-10-CM | POA: Diagnosis not present

## 2023-06-14 DIAGNOSIS — Z83719 Family history of colon polyps, unspecified: Secondary | ICD-10-CM | POA: Diagnosis not present

## 2023-06-14 DIAGNOSIS — E119 Type 2 diabetes mellitus without complications: Secondary | ICD-10-CM | POA: Insufficient documentation

## 2023-06-14 DIAGNOSIS — K921 Melena: Secondary | ICD-10-CM | POA: Insufficient documentation

## 2023-06-14 DIAGNOSIS — Z79890 Hormone replacement therapy (postmenopausal): Secondary | ICD-10-CM | POA: Diagnosis not present

## 2023-06-14 DIAGNOSIS — Z7984 Long term (current) use of oral hypoglycemic drugs: Secondary | ICD-10-CM | POA: Insufficient documentation

## 2023-06-14 DIAGNOSIS — E0590 Thyrotoxicosis, unspecified without thyrotoxic crisis or storm: Secondary | ICD-10-CM | POA: Diagnosis not present

## 2023-06-14 DIAGNOSIS — Z6841 Body Mass Index (BMI) 40.0 and over, adult: Secondary | ICD-10-CM | POA: Diagnosis not present

## 2023-06-14 DIAGNOSIS — R0609 Other forms of dyspnea: Secondary | ICD-10-CM | POA: Diagnosis not present

## 2023-06-14 DIAGNOSIS — Z7985 Long-term (current) use of injectable non-insulin antidiabetic drugs: Secondary | ICD-10-CM | POA: Diagnosis not present

## 2023-06-14 DIAGNOSIS — K641 Second degree hemorrhoids: Secondary | ICD-10-CM | POA: Diagnosis not present

## 2023-06-14 DIAGNOSIS — R0602 Shortness of breath: Secondary | ICD-10-CM | POA: Insufficient documentation

## 2023-06-14 HISTORY — PX: COLONOSCOPY: SHX5424

## 2023-06-14 LAB — POCT PREGNANCY, URINE: Preg Test, Ur: NEGATIVE

## 2023-06-14 LAB — GLUCOSE, CAPILLARY: Glucose-Capillary: 245 mg/dL — ABNORMAL HIGH (ref 70–99)

## 2023-06-14 SURGERY — COLONOSCOPY
Anesthesia: General

## 2023-06-14 MED ORDER — LACTATED RINGERS IV SOLN: INTRAVENOUS | Status: DC | PRN

## 2023-06-14 MED ORDER — DEXMEDETOMIDINE HCL IN NACL 80 MCG/20ML IV SOLN
INTRAVENOUS | Status: AC
  Filled 2023-06-14: qty 20

## 2023-06-14 MED ORDER — PROPOFOL 500 MG/50ML IV EMUL
INTRAVENOUS | Status: DC | PRN
Start: 2023-06-14 — End: 2023-06-14
  Administered 2023-06-14: 150 ug/kg/min via INTRAVENOUS

## 2023-06-14 MED ORDER — DEXMEDETOMIDINE HCL IN NACL 80 MCG/20ML IV SOLN
INTRAVENOUS | Status: DC | PRN
  Administered 2023-06-14: 12 ug via INTRAVENOUS
  Administered 2023-06-14: 8 ug via INTRAVENOUS

## 2023-06-14 MED ORDER — LACTATED RINGERS IV SOLN: INTRAVENOUS | Status: DC

## 2023-06-14 MED ORDER — PROPOFOL 10 MG/ML IV BOLUS
INTRAVENOUS | Status: DC | PRN
  Administered 2023-06-14: 100 mg via INTRAVENOUS

## 2023-06-14 MED ORDER — LIDOCAINE 2% (20 MG/ML) 5 ML SYRINGE
INTRAMUSCULAR | Status: DC | PRN
  Administered 2023-06-14: 100 mg via INTRAVENOUS

## 2023-06-14 NOTE — Anesthesia Preprocedure Evaluation (Signed)
 Anesthesia Evaluation  Patient identified by MRN, date of birth, ID band Patient awake    Reviewed: Allergy & Precautions, H&P , NPO status , Patient's Chart, lab work & pertinent test results, reviewed documented beta blocker date and time   History of Anesthesia Complications (+) history of anesthetic complications  Airway Mallampati: II  TM Distance: >3 FB Neck ROM: full    Dental no notable dental hx.    Pulmonary shortness of breath   Pulmonary exam normal breath sounds clear to auscultation       Cardiovascular Exercise Tolerance: Good hypertension, + DOE   Rhythm:regular Rate:Normal     Neuro/Psych negative neurological ROS  negative psych ROS   GI/Hepatic ,GERD  ,,(+) Hepatitis -  Endo/Other  diabetesHypothyroidism Hyperthyroidism Class 4 obesity  Renal/GU negative Renal ROS  negative genitourinary   Musculoskeletal   Abdominal   Peds  Hematology  (+) Blood dyscrasia, anemia   Anesthesia Other Findings   Reproductive/Obstetrics negative OB ROS                             Anesthesia Physical Anesthesia Plan  ASA: 3  Anesthesia Plan: General   Post-op Pain Management:    Induction:   PONV Risk Score and Plan: Propofol  infusion  Airway Management Planned:   Additional Equipment:   Intra-op Plan:   Post-operative Plan:   Informed Consent: I have reviewed the patients History and Physical, chart, labs and discussed the procedure including the risks, benefits and alternatives for the proposed anesthesia with the patient or authorized representative who has indicated his/her understanding and acceptance.     Dental Advisory Given  Plan Discussed with: CRNA  Anesthesia Plan Comments:        Anesthesia Quick Evaluation

## 2023-06-14 NOTE — Anesthesia Procedure Notes (Signed)
 Date/Time: 06/14/2023 8:28 AM  Performed by: Sherwin Donate, CRNAPre-anesthesia Checklist: Patient identified, Emergency Drugs available, Suction available and Patient being monitored Patient Re-evaluated:Patient Re-evaluated prior to induction Oxygen Delivery Method: Nasal cannula Induction Type: IV induction Placement Confirmation: positive ETCO2 Comments: Optiflow High Flow [place] O2 used.

## 2023-06-14 NOTE — H&P (Signed)
 @LOGO @   Primary Care Physician:  Serita Danes, MD Primary Gastroenterologist:  Dr. Riley Cheadle  Pre-Procedure History & Physical: HPI:  Melissa Schmidt is a 48 y.o. female here for  further evaluation of paper hematochezia   Via colonoscopy.  Past Medical History:  Diagnosis Date   Arthritis    Borderline diabetes    Celiac disease    Complication of anesthesia    shaking post anesthesia   Diabetes mellitus without complication (HCC)    Dyspnea    Hemorrhoids    Hyperthyroidism    Hypertriglyceridemia    PCOS (polycystic ovarian syndrome)     Past Surgical History:  Procedure Laterality Date   APPENDECTOMY  2002   COLONOSCOPY WITH PROPOFOL  N/A 05/25/2015   Dr.Donavon Kimrey- non-bleeding hemorrhoids, the examination was o/w normal   ESOPHAGEAL DILATION N/A 05/25/2015   Procedure: ESOPHAGEAL DILATION;  Surgeon: Suzette Espy, MD;  Location: [facility];  Service: Endoscopy;  Laterality: N/A;   ESOPHAGOGASTRODUODENOSCOPY  04/2017   [place]: EGD with irregular Z-line s/p biopsy, benign-appearing esophageal stenosis s/p dilation, normal examined duodenum but no biopsies of duodenum. Pathology with junctional type GE mucosa with chronic inflammation and surface erosion, no metaplasia, negative dysplasia.    ESOPHAGOGASTRODUODENOSCOPY (EGD) WITH PROPOFOL  N/A 05/25/2015   Dr.Nobuko Gsell- normal esophagus, dilated, gastric erosions, intact fundoplication, normal third portion of the duodenum, non-bleeding erosive gastropathy. duodenum bx= benign small bowel mucosa with patchy increased intraepithelial lymphocytes, mild villous blunting and mild crypt hyperplasia, no dysplasia or malignancy stomach bx= erosive gastritis with reactive changes   LIVER BIOPSY N/A 11/11/2022   Procedure: LIVER BIOPSY;  Surgeon: Awilda Bogus, MD;  Location: [facility];  Service: General;  Laterality: N/A;   NISSEN FUNDOPLICATION  2005    Prior to Admission medications   Medication Sig Start Date End Date Taking?  Authorizing Provider  albuterol  (PROAIR  HFA) 108 (90 Base) MCG/ACT inhaler 2 puffs every 4 hours as needed only  if your can't catch your breath 05/02/23  Yes Diamond Formica, MD  Ascorbic Acid (VITAMIN C ADULT GUMMIES PO) Take 2 tablets by mouth daily.   Yes [provider]  budeson-glycopyrrolate -formoterol  (BREZTRI  AEROSPHERE) 160-9-4.8 MCG/ACT AERO Inhale 2 puffs into the lungs in the morning and at bedtime. 05/02/23  Yes Diamond Formica, MD  budesonide -formoterol  (SYMBICORT ) 80-4.5 MCG/ACT inhaler Take 2 puffs first thing in am and then another 2 puffs about 12 hours later. 05/02/23  Yes Diamond Formica, MD  Calcium  Carb-Cholecalciferol (CALCIUM  1000 + D PO) Take 1 tablet by mouth daily.   Yes [provider]  Cholecalciferol (VITAMIN D3) 125 MCG (5000 UT) CAPS Take 1 capsule (5,000 Units total) by mouth daily. 12/03/20  Yes Nida, Gebreselassie W, MD  famotidine  (PEPCID ) 20 MG tablet One after supper 05/02/23  Yes Wert, Michael B, MD  ferrous sulfate 325 (65 FE) MG EC tablet Take 325 mg by mouth daily with breakfast.   Yes [provider]  fluticasone (FLONASE) 50 MCG/ACT nasal spray Place 2 sprays into both nostrils daily as needed for allergies.   Yes [provider]  hydrocortisone  (ANUSOL -HC) 2.5 % rectal cream Place 1 application rectally 2 (two) times daily. 12/11/18  Yes Delman Ferns, NP  hydrocortisone  (ANUSOL -HC) 2.5 % rectal cream Place 1 Application rectally 2 (two) times daily. 05/11/23  Yes Delman Ferns, NP  levothyroxine  (SYNTHROID ) 200 MCG tablet TAKE 1 TABLET BY MOUTH ONCE DAILY BEFORE BREAKFAST 05/02/23  Yes Nida, Gebreselassie W, MD  levothyroxine  (  SYNTHROID ) 25 MCG tablet TAKE 1 TABLET BY MOUTH ONCE DAILY IN ADDITION TO YOUR 200MCG TO MAKE A TOTAL DAILY DOSE OF 05/02/23  Yes Nida, Gebreselassie W, MD  lurasidone (LATUDA) 80 MG TABS tablet Take 80 mg by mouth daily with breakfast.   Yes [provider]  metFORMIN (GLUCOPHAGE) 1000 MG  tablet Take 1,000 mg by mouth 2 (two) times daily with a meal.    Yes [provider]  methocarbamol  (ROBAXIN ) 500 MG tablet Take 1 tablet (500 mg total) by mouth 3 (three) times daily. 10/13/22  Yes Williams, Megan E, NP  Multiple Vitamin (MULTIVITAMIN) capsule Take 1 capsule by mouth daily.   Yes [provider]  pantoprazole  (PROTONIX ) 40 MG tablet TAKE 1 TABLET BY MOUTH ONCE DAILY BEFORE BREAKFAST 12/16/21  Yes Delman Ferns, NP  PARoxetine (PAXIL-CR) 25 MG 24 hr tablet Take 25 mg by mouth daily. 09/28/22  Yes [provider]  propranolol  (INDERAL ) 20 MG tablet Take 10 mg by mouth every 6 (six) hours as needed (anxiety). 09/28/22  Yes [provider]  rosuvastatin  (CRESTOR ) 5 MG tablet Take 1 tablet (5 mg total) by mouth at bedtime. 11/01/22  Yes Nida, Gebreselassie W, MD  topiramate (TOPAMAX) 25 MG tablet Take 100 mg by mouth daily.   Yes [provider]  Dulaglutide  (TRULICITY ) 1.5 MG/0.5ML SOPN Inject 1.5 mg into the skin once a week. Patient taking differently: Inject 1.5 mg into the skin once a week. Wednesday 11/01/22   Nida, Gebreselassie W, MD  hydrOXYzine (ATARAX) 50 MG tablet Take 50 mg by mouth at bedtime as needed for anxiety. 10/12/22   [provider]  ondansetron  (ZOFRAN ) 4 MG tablet Take 1 tablet (4 mg total) by mouth every 8 (eight) hours as needed. 11/11/22 11/11/23  Awilda Bogus, MD    Allergies as of 05/16/2023 - Review Complete 05/11/2023  Allergen Reaction Noted   Singulair [montelukast] Rash 05/31/2019    Family History  Problem Relation Age of Onset   Irritable bowel syndrome Mother    Colon polyps Mother        ? 5 year surveillance   Inflammatory bowel disease Neg Hx    Colon cancer Neg Hx    Liver disease Neg Hx     Social History   Socioeconomic History   Marital status: Married    Spouse name: Not on file   Number of children: 1   Years of education: Not on file   Highest education level: Not on file   Occupational History   Occupation: in school  Tobacco Use   Smoking status: Never   Smokeless tobacco: Never  Vaping Use   Vaping status: Never Used  Substance and Sexual Activity   Alcohol use: No    Alcohol/week: 0.0 standard drinks of alcohol   Drug use: No   Sexual activity: Yes    Birth control/protection: None  Other Topics Concern   Not on file  Social History Narrative   Not on file   Social Drivers of Health   Financial Resource Strain: Not on file  Food Insecurity: Not on file  Transportation Needs: Not on file  Physical Activity: Not on file  Stress: Not on file  Social Connections: Not on file  Intimate Partner Violence: Not on file    Review of Systems: See HPI, otherwise negative ROS  Physical Exam: BP (!) 265/108   Pulse 84   Temp 98.4 F (36.9 C) (Oral)   Resp 15  Ht 5\' 3"  (1.6 m)   Wt (!) 140.2 kg   SpO2 100%   BMI 54.74 kg/m  General:   Alert,  Well-developed, well-nourished, pleasant and cooperative in NAD Heart:  Regular rate and rhythm; no murmurs, clicks, rubs,  or gallops. Abdomen: Non-distended, normal bowel sounds.  Soft and nontender without appreciable mass or hepatosplenomegaly.  Impression/Plan:    48 year old lady with paper hematochezia she is here for diagnostic colonoscopy. The risks, benefits, limitations, alternatives and imponderables have been reviewed with the patient. Questions have been answered. All parties are agreeable.       Notice: This dictation was prepared with Dragon dictation along with smaller phrase technology. Any transcriptional errors that result from this process are unintentional and may not be corrected upon review.

## 2023-06-14 NOTE — Transfer of Care (Signed)
 Immediate Anesthesia Transfer of Care Note  Patient: Melissa Schmidt  Procedure(s) Performed: COLONOSCOPY  Patient Location: Short Stay  Anesthesia Type:General  Level of Consciousness: awake, alert , and oriented  Airway & Oxygen Therapy: Patient Spontanous Breathing  Post-op Assessment: Report given to RN and Post -op Vital signs reviewed and stable  Post vital signs: Reviewed and stable  Last Vitals:  Vitals Value Taken Time  BP    Temp    Pulse    Resp    SpO2      Last Pain:  Vitals:   06/14/23 0829  TempSrc:   PainSc: 0-No pain         Complications: No notable events documented.

## 2023-06-14 NOTE — Discharge Instructions (Addendum)
  Colonoscopy Discharge Instructions  Read the instructions outlined below and refer to this sheet in the next few weeks. These discharge instructions provide you with general information on caring for yourself after you leave the hospital. Your doctor may also give you specific instructions. While your treatment has been planned according to the most current medical practices available, unavoidable complications occasionally occur. If you have any problems or questions after discharge, call Dr. Riley Cheadle at [phone]. ACTIVITY You may resume your regular activity, but move at a slower pace for the next 24 hours.  Take frequent rest periods for the next 24 hours.  Walking will help get rid of the air and reduce the bloated feeling in your belly (abdomen).  No driving for 24 hours (because of the medicine (anesthesia) used during the test).   Do not sign any important legal documents or operate any machinery for 24 hours (because of the anesthesia used during the test).  NUTRITION Drink plenty of fluids.  You may resume your normal diet as instructed by your doctor.  Begin with a light meal and progress to your normal diet. Heavy or fried foods are harder to digest and may make you feel sick to your stomach (nauseated).  Avoid alcoholic beverages for 24 hours or as instructed.  MEDICATIONS You may resume your normal medications unless your doctor tells you otherwise.  WHAT YOU CAN EXPECT TODAY Some feelings of bloating in the abdomen.  Passage of more gas than usual.  Spotting of blood in your stool or on the toilet paper.  IF YOU HAD POLYPS REMOVED DURING THE COLONOSCOPY: No aspirin products for 7 days or as instructed.  No alcohol for 7 days or as instructed.  Eat a soft diet for the next 24 hours.  FINDING OUT THE RESULTS OF YOUR TEST Not all test results are available during your visit. If your test results are not back during the visit, make an appointment with your caregiver to find out the  results. Do not assume everything is normal if you have not heard from your caregiver or the medical facility. It is important for you to follow up on all of your test results.  SEEK IMMEDIATE MEDICAL ATTENTION IF: You have more than a spotting of blood in your stool.  Your belly is swollen (abdominal distention).  You are nauseated or vomiting.  You have a temperature over 101.  You have abdominal pain or discomfort that is severe or gets worse throughout the day.     your colonoscopy was normal except for hemorrhoids  It is recommended you return  in 10 years for repeat colonoscopy  office visit with Karna Pacas in 1 month for hemorrhoid banding.  Pamphlet on hemorrhoid banding provided.  At patient request I called Ronnie at [phone] findings and recommendations

## 2023-06-14 NOTE — Op Note (Signed)
 [facility] Patient Name: Melissa Schmidt Procedure Date: 06/14/2023 8:01 AM MRN: [id number] Date of Birth: 18-Jun-1975 Attending MD: Gemma Kelp , MD, [id number] CSN: [id number] Age: 48 Admit Type: Outpatient Procedure:                Colonoscopy Indications:              Hematochezia Providers:                Gemma Kelp, MD, Karyle Pagoda, RN, [place]                            Wilson, Technician, Theola Fitch Referring MD:              Medicines:                Propofol  per Anesthesia Complications:            No immediate complications. Estimated Blood Loss:     Estimated blood loss: none. Procedure:                Pre-Anesthesia Assessment:                           - Prior to the procedure, a History and Physical                            was performed, and patient medications and                            allergies were reviewed. The patient's tolerance of                            previous anesthesia was also reviewed. The risks                            and benefits of the procedure and the sedation                            options and risks were discussed with the patient.                            All questions were answered, and informed consent                            was obtained. Prior Anticoagulants: The patient has                            taken no anticoagulant or antiplatelet agents. ASA                            Grade Assessment: II - A patient with mild systemic                            disease. After reviewing the risks and benefits,  the patient was deemed in satisfactory condition to                            undergo the procedure.                           After obtaining informed consent, the colonoscope                            was passed under direct vision. Throughout the                            procedure, the patient's blood pressure, pulse, and                            oxygen saturations were  monitored continuously. The                            [id number]) scope was introduced through the                            anus and advanced to the the cecum, identified by                            appendiceal orifice and ileocecal valve. The                            colonoscopy was performed without difficulty. The                            patient tolerated the procedure well. The quality                            of the bowel preparation was adequate. The entire                            colon was well visualized. Scope In: 8:35:36 AM Scope Out: 8:46:02 AM Scope Withdrawal Time: 0 hours 6 minutes 44 seconds  Total Procedure Duration: 0 hours 10 minutes 26 seconds  Findings:      Hemorrhoids were found on perianal exam.      Non-bleeding external and internal hemorrhoids were found during       retroflexion. The hemorrhoids were moderate, medium-sized and Grade II       (internal hemorrhoids that prolapse but reduce spontaneously).      The colon (entire examined portion) appeared normal.      The exam was otherwise without abnormality on direct and retroflexion       views. Impression:               - Hemorrhoids found on perianal exam.                           - Non-bleeding external and internal hemorrhoids.                           -  The entire examined colon is normal.                           - The examination was otherwise normal on direct                            and retroflexion views.                           - No specimens collected. Patient would likely                            benefit from hemorrhoid banding. Moderate Sedation:      Moderate (conscious) sedation was personally administered by an       anesthesia professional. The following parameters were monitored: oxygen       saturation, heart rate, blood pressure, respiratory rate, EKG, adequacy       of pulmonary ventilation, and response to care. Recommendation:           - Patient has a  contact number available for                            emergencies. The signs and symptoms of potential                            delayed complications were discussed with the                            patient. Return to normal activities tomorrow.                            Written discharge instructions were provided to the                            patient.                           - Advance diet as tolerated.                           - Continue present medications.                           - Repeat colonoscopy in 10 years for screening                            purposes.                           - Return to GI office in 1 month. Pamphlet on                            hemorrhoid banding provided. Procedure Code(s):        --- Professional ---  40981, Colonoscopy, flexible; diagnostic, including                            collection of specimen(s) by brushing or washing,                            when performed (separate procedure) Diagnosis Code(s):        --- Professional ---                           K64.1, Second degree hemorrhoids                           K92.1, Melena (includes Hematochezia) CPT copyright 2022 American Medical Association. All rights reserved. The codes documented in this report are preliminary and upon coder review may  be revised to meet current compliance requirements. Windsor Hatcher. Leeasia Secrist, MD Gemma Kelp, MD 06/14/2023 8:58:12 AM This report has been signed electronically. Number of Addenda: 0

## 2023-06-15 ENCOUNTER — Encounter (HOSPITAL_COMMUNITY): Payer: Self-pay | Admitting: Internal Medicine

## 2023-06-16 NOTE — Anesthesia Postprocedure Evaluation (Signed)
 Anesthesia Post Note  Patient: Melissa Schmidt  Procedure(s) Performed: COLONOSCOPY  Patient location during evaluation: Phase II Anesthesia Type: General Level of consciousness: awake Pain management: pain level controlled Vital Signs Assessment: post-procedure vital signs reviewed and stable Respiratory status: spontaneous breathing and respiratory function stable Cardiovascular status: blood pressure returned to baseline and stable Postop Assessment: no headache and no apparent nausea or vomiting Anesthetic complications: no Comments: Late entry   No notable events documented.   Last Vitals:  Vitals:   06/14/23 0722 06/14/23 0850  BP: (!) 265/108 107/80  Pulse: 84 85  Resp: 15 18  Temp: 36.9 C 36.7 C  SpO2: 100% 99%    Last Pain:  Vitals:   06/15/23 1603  TempSrc:   PainSc: 0-No pain                 Melissa Schmidt

## 2023-06-19 ENCOUNTER — Ambulatory Visit: Admitting: "Endocrinology

## 2023-06-24 ENCOUNTER — Other Ambulatory Visit: Payer: Self-pay | Admitting: "Endocrinology

## 2023-07-06 LAB — CBC WITH DIFFERENTIAL/PLATELET
Basophils Absolute: 0.1 10*3/uL (ref 0.0–0.2)
Basos: 1 %
EOS (ABSOLUTE): 0.2 10*3/uL (ref 0.0–0.4)
Eos: 2 %
Hematocrit: 44.3 % (ref 34.0–46.6)
Hemoglobin: 13.8 g/dL (ref 11.1–15.9)
Immature Grans (Abs): 0.1 10*3/uL (ref 0.0–0.1)
Immature Granulocytes: 1 %
Lymphocytes Absolute: 2.3 10*3/uL (ref 0.7–3.1)
Lymphs: 23 %
MCH: 25.7 pg — ABNORMAL LOW (ref 26.6–33.0)
MCHC: 31.2 g/dL — ABNORMAL LOW (ref 31.5–35.7)
MCV: 82 fL (ref 79–97)
Monocytes Absolute: 0.5 10*3/uL (ref 0.1–0.9)
Monocytes: 5 %
Neutrophils Absolute: 6.8 10*3/uL (ref 1.4–7.0)
Neutrophils: 68 %
Platelets: 297 10*3/uL (ref 150–450)
RBC: 5.38 x10E6/uL — ABNORMAL HIGH (ref 3.77–5.28)
RDW: 14.4 % (ref 11.7–15.4)
WBC: 10 10*3/uL (ref 3.4–10.8)

## 2023-07-06 LAB — COMPREHENSIVE METABOLIC PANEL WITH GFR
ALT: 53 IU/L — ABNORMAL HIGH (ref 0–32)
AST: 77 IU/L — ABNORMAL HIGH (ref 0–40)
Albumin: 4.1 g/dL (ref 3.9–4.9)
Alkaline Phosphatase: 102 IU/L (ref 44–121)
BUN/Creatinine Ratio: 12 (ref 9–23)
BUN: 10 mg/dL (ref 6–24)
Bilirubin Total: 0.3 mg/dL (ref 0.0–1.2)
CO2: 19 mmol/L — ABNORMAL LOW (ref 20–29)
Calcium: 9.7 mg/dL (ref 8.7–10.2)
Chloride: 95 mmol/L — ABNORMAL LOW (ref 96–106)
Creatinine, Ser: 0.83 mg/dL (ref 0.57–1.00)
Globulin, Total: 2.8 g/dL (ref 1.5–4.5)
Glucose: 368 mg/dL — ABNORMAL HIGH (ref 70–99)
Potassium: 4.5 mmol/L (ref 3.5–5.2)
Sodium: 131 mmol/L — ABNORMAL LOW (ref 134–144)
Total Protein: 6.9 g/dL (ref 6.0–8.5)
eGFR: 88 mL/min/{1.73_m2} (ref >59)

## 2023-07-06 LAB — IRON,TIBC AND FERRITIN PANEL
Ferritin: 237 ng/mL — ABNORMAL HIGH (ref 15–150)
Iron Saturation: 21 % (ref 15–55)
Iron: 55 ug/dL (ref 27–159)
Total Iron Binding Capacity: 268 ug/dL (ref 250–450)
UIBC: 213 ug/dL (ref 131–425)

## 2023-07-06 LAB — VITAMIN D 25 HYDROXY (VIT D DEFICIENCY, FRACTURES): Vit D, 25-Hydroxy: 19 ng/mL — ABNORMAL LOW (ref 30.0–100.0)

## 2023-07-08 LAB — COMPREHENSIVE METABOLIC PANEL WITH GFR
ALT: 52 IU/L — ABNORMAL HIGH (ref 0–32)
AST: 77 IU/L — ABNORMAL HIGH (ref 0–40)
Albumin: 4 g/dL (ref 3.9–4.9)
Alkaline Phosphatase: 101 IU/L (ref 44–121)
BUN/Creatinine Ratio: 10 (ref 9–23)
BUN: 10 mg/dL (ref 6–24)
Bilirubin Total: 0.4 mg/dL (ref 0.0–1.2)
CO2: 20 mmol/L (ref 20–29)
Calcium: 9.8 mg/dL (ref 8.7–10.2)
Chloride: 97 mmol/L (ref 96–106)
Creatinine, Ser: 1.01 mg/dL — ABNORMAL HIGH (ref 0.57–1.00)
Globulin, Total: 2.8 g/dL (ref 1.5–4.5)
Glucose: 391 mg/dL — ABNORMAL HIGH (ref 70–99)
Potassium: 4.5 mmol/L (ref 3.5–5.2)
Sodium: 134 mmol/L (ref 134–144)
Total Protein: 6.8 g/dL (ref 6.0–8.5)
eGFR: 70 mL/min/{1.73_m2} (ref >59)

## 2023-07-08 LAB — LIPID PANEL
Chol/HDL Ratio: 7 ratio — ABNORMAL HIGH (ref 0.0–4.4)
Cholesterol, Total: 209 mg/dL — ABNORMAL HIGH (ref 100–199)
HDL: 30 mg/dL — ABNORMAL LOW (ref >39)
LDL Chol Calc (NIH): 125 mg/dL — ABNORMAL HIGH (ref 0–99)
Triglycerides: 302 mg/dL — ABNORMAL HIGH (ref 0–149)
VLDL Cholesterol Cal: 54 mg/dL — ABNORMAL HIGH (ref 5–40)

## 2023-07-08 LAB — T4, FREE: Free T4: 1.42 ng/dL (ref 0.82–1.77)

## 2023-07-08 LAB — VITAMIN D 25 HYDROXY (VIT D DEFICIENCY, FRACTURES): Vit D, 25-Hydroxy: 23.9 ng/mL — ABNORMAL LOW (ref 30.0–100.0)

## 2023-07-08 LAB — TSH: TSH: 7.03 u[IU]/mL — ABNORMAL HIGH (ref 0.450–4.500)

## 2023-07-10 ENCOUNTER — Ambulatory Visit: Admitting: "Endocrinology

## 2023-07-13 ENCOUNTER — Telehealth: Payer: Self-pay

## 2023-07-13 MED ORDER — HYDROCORTISONE (PERIANAL) 2.5 % EX CREA: 1.0000 | TOPICAL_CREAM | Freq: Two times a day (BID) | CUTANEOUS | 1 refills | Status: DC

## 2023-07-13 NOTE — Addendum Note (Signed)
 Addended by: Delman Ferns on: 07/13/2023 04:31 PM   Modules accepted: Orders

## 2023-07-13 NOTE — Telephone Encounter (Signed)
 We will need to keep that banding appointment, as she needs it! She should use the anusol  cream sent in to take twice a day per rectum. Avoid straining, limit toilet time to 2-3 minutes.

## 2023-07-13 NOTE — Telephone Encounter (Signed)
 Phoned the pt and advised of the note / instructions /medication sent in / keep banding appt. Pt expressed understanding

## 2023-07-13 NOTE — Telephone Encounter (Signed)
 Spoke with pt and she advised me that she was bleeding some last night after a BM and it stopped. It has started back up today everytime she uses the bathroom. Blood is now in the toilet as well as tissue. Pt wants to know what to do. She has a banding 6/12. Please advise

## 2023-07-19 LAB — HEMOGLOBIN A1C: Hemoglobin A1C: 11.4

## 2023-07-20 ENCOUNTER — Encounter: Payer: Self-pay | Admitting: Gastroenterology

## 2023-07-20 ENCOUNTER — Ambulatory Visit (INDEPENDENT_AMBULATORY_CARE_PROVIDER_SITE_OTHER): Admitting: Gastroenterology

## 2023-07-20 VITALS — BP 129/85 | HR 93 | Temp 98.2°F | Ht 62.0 in | Wt 304.4 lb

## 2023-07-20 DIAGNOSIS — K641 Second degree hemorrhoids: Secondary | ICD-10-CM | POA: Diagnosis not present

## 2023-07-20 DIAGNOSIS — R748 Abnormal levels of other serum enzymes: Secondary | ICD-10-CM

## 2023-07-20 MED ORDER — VITAMIN D (ERGOCALCIFEROL) 1.25 MG (50000 UNIT) PO CAPS: 50000.0000 [IU] | ORAL_CAPSULE | ORAL | 1 refills | Status: DC

## 2023-07-20 NOTE — Patient Instructions (Signed)
  Please avoid straining.  You should limit your toilet time to 2-3 minutes at the most.   I have sent in Vit D to take 1 gelcap weekly for 8 weeks, then we will recheck labs. I have also ordered more extensive labs for your liver.   Please call me with any concerns or issues!  I will see you in follow-up for additional banding in several weeks.     I enjoyed seeing you again today! I value our relationship and want to provide genuine, compassionate, and quality care. You may receive a survey regarding your visit with me, and I welcome your feedback! Thanks so much for taking the time to complete this. I look forward to seeing you again.      Delman Ferns, PhD, ANP-BC [facility]

## 2023-07-20 NOTE — Progress Notes (Signed)
       [facility] BANDING PROCEDURE NOTE  Melissa Schmidt is a 49 y.o. female presenting today for consideration of hemorrhoid banding. Last colonoscopy May 2025: non-bleeding external and internal hemorrhoids, otherwise normal. She has had rectal bleeding primarily, with large episode few weeks ago in setting of acute diarrheal illness.    The patient presents with symptomatic grade 2 hemorrhoids, unresponsive to maximal medical therapy, requesting rubber band ligation of her hemorrhoidal disease. All risks, benefits, and alternative forms of therapy were described and informed consent was obtained.   The decision was made to band the left lateral internal hemorrhoid, and the [facility] O'Regan System was used to perform band ligation without complication. Digital anorectal examination was then performed to assure proper positioning of the band, and to adjust the banded tissue as required. The patient was discharged home without pain or other issues. Dietary and behavioral recommendations were given, along with follow-up instructions. The patient will return in several weeks for followup and possible additional banding as required. We can consider banding 2 columns at next visit. I have sent in Vit D 50,000 units weekly for 8 weeks, with repeat labs thereafter. 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   No complications were encountered and the patient tolerated the procedure well.   Delman Ferns, PhD, ANP-BC [facility]

## 2023-07-23 NOTE — Progress Notes (Unsigned)
 Melissa Schmidt, female    DOB: 1975-03-22    MRN: [id number]   Brief patient profile:  61  yowf  never smoker with seasonal rhinitis teenager self referred to pulmonary clinic in [facility]  05/02/2023 for ? AB ?  Ever since covid  2020   Wt 281 pre covid 2020   History of Present Illness  05/02/2023  Pulmonary/ 1st office eval/ [facility] / [place] [facility] wt 306  Chief Complaint  Patient presents with   Consult    Having trouble breathing SOB   Dyspnea:  limited by R knee/ steps a big problem  Cough: harsh dry cough  Sleep: flat bed / props up with 2 pillows helps breathing and cough  SABA use: couple times a week seems to help cough but baseline hfa technique < 25 % effective 02: none  Rec Plan A = Automatic = Always=    Symbicort  80 (Breyna  80 ) Take 2 puffs first thing in am and then another 2 puffs about 12 hours later.   Work on inhaler technique:   Plan B = Backup (to supplement plan A, not to replace it) Only use your Proaire  (albuterol  inhaler) as a rescue medication  Pantoprazole  (protonix ) 40 mg   Take  30-60 min before first meal of the day and Pepcid  (famotidine )  20 mg after supper until return to office -  GERD diet reviewed, bed blocks rec  Please schedule a follow up office visit in 6 weeks, call sooner if needed with all medications /inhalers/ solutions in hand     07/24/2023  f/u ov/[place] [facility]/Leann Mayweather re: cough  maint on symbicort  80 did not bring meds  Chief Complaint  Patient presents with   Shortness of Breath    Green mucus when coughing which started today   Follow-up  Dyspnea:  improving  Cough: afternoons / trace green mucus  Sleeping: bed is flat 1-2 pillows s resp cc  SABA use: less  02: none      No obvious day to day or daytime variability or assoc   mucus plugs or hemoptysis or cp or chest tightness, subjective wheeze or overt sinus or hb symptoms.    Also denies any obvious fluctuation of symptoms with weather or environmental changes or  other aggravating or alleviating factors except as outlined above   No unusual exposure hx or h/o childhood pna/ asthma or knowledge of premature birth.  Current Allergies, Complete Past Medical History, Past Surgical History, Family History, and Social History were reviewed in [employer] record.  ROS  The following are not active complaints unless bolded Hoarseness, sore throat, dysphagia, dental problems, itching, sneezing,  nasal congestion or discharge of excess mucus or purulent secretions, ear ache,   fever, chills, sweats, unintended wt loss or wt gain, classically pleuritic or exertional cp,  orthopnea pnd or arm/hand swelling  or leg swelling, presyncope, palpitations, abdominal pain, anorexia, nausea, vomiting, diarrhea  or change in bowel habits or change in bladder habits, change in stools or change in urine, dysuria, hematuria,  rash, arthralgias, visual complaints, headache, numbness, weakness or ataxia or problems with walking or coordination,  change in mood or  memory.        Current Meds  Medication Sig   albuterol  (PROAIR  HFA) 108 (90 Base) MCG/ACT inhaler 2 puffs every 4 hours as needed only  if your can't catch your breath   Ascorbic Acid (VITAMIN C ADULT GUMMIES PO) Take 2 tablets by mouth daily.  budeson-glycopyrrolate -formoterol  (BREZTRI  AEROSPHERE) 160-9-4.8 MCG/ACT AERO Inhale 2 puffs into the lungs in the morning and at bedtime.   budesonide -formoterol  (SYMBICORT ) 80-4.5 MCG/ACT inhaler Take 2 puffs first thing in am and then another 2 puffs about 12 hours later.   Calcium  Carb-Cholecalciferol (CALCIUM  1000 + D PO) Take 1 tablet by mouth daily.   Cholecalciferol (VITAMIN D3) 125 MCG (5000 UT) CAPS Take 1 capsule (5,000 Units total) by mouth daily.   Dulaglutide  (TRULICITY ) 1.5 MG/0.5ML SOPN Inject 1.5 mg into the skin once a week.   famotidine  (PEPCID ) 20 MG tablet One after supper   ferrous sulfate 325 (65 FE) MG EC tablet Take 325 mg by mouth  daily with breakfast.   fluticasone (FLONASE) 50 MCG/ACT nasal spray Place 2 sprays into both nostrils daily as needed for allergies.   hydrocortisone  (ANUSOL -HC) 2.5 % rectal cream Place 1 application rectally 2 (two) times daily.   hydrocortisone  (ANUSOL -HC) 2.5 % rectal cream Place 1 Application rectally 2 (two) times daily.   hydrocortisone  (ANUSOL -HC) 2.5 % rectal cream Place 1 Application rectally 2 (two) times daily.   hydrOXYzine (ATARAX) 50 MG tablet Take 50 mg by mouth at bedtime as needed for anxiety.   levothyroxine  (SYNTHROID ) 200 MCG tablet TAKE 1 TABLET BY MOUTH ONCE DAILY BEFORE BREAKFAST   levothyroxine  (SYNTHROID ) 25 MCG tablet TAKE 1 TABLET BY MOUTH ONCE DAILY IN ADDITION TO  YOUR 200MCG TO MAKE A TOTAL DAILY DOSE OF 225MCG   losartan (COZAAR) 50 MG tablet Take 1 tablet by mouth daily.   lurasidone (LATUDA) 80 MG TABS tablet Take 80 mg by mouth daily with breakfast.   metFORMIN (GLUCOPHAGE) 1000 MG tablet Take 1,000 mg by mouth 2 (two) times daily with a meal.    methocarbamol  (ROBAXIN ) 500 MG tablet Take 1 tablet (500 mg total) by mouth 3 (three) times daily.   Multiple Vitamin (MULTIVITAMIN) capsule Take 1 capsule by mouth daily.   ondansetron  (ZOFRAN ) 4 MG tablet Take 1 tablet (4 mg total) by mouth every 8 (eight) hours as needed.   pantoprazole  (PROTONIX ) 40 MG tablet TAKE 1 TABLET BY MOUTH ONCE DAILY BEFORE BREAKFAST   PARoxetine (PAXIL-CR) 25 MG 24 hr tablet Take 25 mg by mouth daily.   propranolol  (INDERAL ) 20 MG tablet Take 10 mg by mouth every 6 (six) hours as needed (anxiety).   rosuvastatin  (CRESTOR ) 5 MG tablet Take 1 tablet (5 mg total) by mouth at bedtime.   topiramate (TOPAMAX) 25 MG tablet Take 100 mg by mouth daily.   Vitamin D , Ergocalciferol , (DRISDOL ) 1.25 MG (50000 UNIT) CAPS capsule Take 1 capsule (50,000 Units total) by mouth every 7 (seven) days.             Past Medical History:  Diagnosis Date   Arthritis    Borderline diabetes    Celiac  disease    Complication of anesthesia    shaking post anesthesia   Diabetes mellitus without complication (HCC)    Dyspnea    Hemorrhoids    Hyperthyroidism    Hypertriglyceridemia    PCOS (polycystic ovarian syndrome)       Objective:    Wt Readings from Last 3 Encounters:  07/24/23 (!) 307 lb (139.3 kg)  07/20/23 (!) 304 lb 6.4 oz (138.1 kg)  06/14/23 (!) 309 lb (140.2 kg)      Vital signs reviewed  07/24/2023  - Note at rest 02 sats  94% on RA   General appearance:    MO (by bmi ) amb  wf nad    HEENT : Oropharynx  clear      Nasal turbinates nl    NECK :  without  apparent JVD/ palpable Nodes/TM    LUNGS: no acc muscle use,  Nl contour chest which is clear to A and P bilaterally without cough on insp or exp maneuvers   CV:  RRR  no s3 or murmur or increase in P2, and no edema   ABD:  massively obese but soft and nontender   MS:  Gait nl   ext warm without deformities Or obvious joint restrictions  calf tenderness, cyanosis or clubbing    SKIN: warm and dry without lesions    NEURO:  alert, approp, nl sensorium with  no motor or cerebellar deficits apparent.          Assessment

## 2023-07-24 ENCOUNTER — Ambulatory Visit (INDEPENDENT_AMBULATORY_CARE_PROVIDER_SITE_OTHER): Admitting: Internal Medicine

## 2023-07-24 ENCOUNTER — Encounter: Payer: Self-pay | Admitting: Internal Medicine

## 2023-07-24 VITALS — BP 126/82 | HR 99 | Ht 62.0 in | Wt 307.0 lb

## 2023-07-24 DIAGNOSIS — R0609 Other forms of dyspnea: Secondary | ICD-10-CM | POA: Diagnosis not present

## 2023-07-24 DIAGNOSIS — R058 Other specified cough: Secondary | ICD-10-CM | POA: Diagnosis not present

## 2023-07-24 MED ORDER — PREDNISONE 10 MG PO TABS: ORAL_TABLET | ORAL | 2 refills | Status: DC

## 2023-07-24 NOTE — Patient Instructions (Addendum)
 No change in medications  Work on inhaler technique:  relax and gently blow all the way out then take a nice smooth full deep breath back in, triggering the inhaler at same time you start breathing in.  Hold breath in for at least  5 seconds if you can. Blow out symbicort  80 thru nose. Rinse and gargle with water  when done.  If mouth or throat bother you at all,  try brushing teeth/gums/tongue with arm and hammer toothpaste/ make a slurry and gargle and spit out.   >>>  Remember how golfers warm up by taking practice swings - do this with an empty inhaler   If not doing well can use prednisone  x 6 days as a backup as we talked about today, but only x 6 days with limited refills s ov   Please remember to go to the lab department   for your tests - we will call you with the results when they are available.      Please schedule a follow up visit in 6 months but call sooner if needed

## 2023-07-25 ENCOUNTER — Encounter: Payer: Self-pay | Admitting: "Endocrinology

## 2023-07-25 ENCOUNTER — Ambulatory Visit (INDEPENDENT_AMBULATORY_CARE_PROVIDER_SITE_OTHER): Admitting: "Endocrinology

## 2023-07-25 VITALS — BP 116/82 | HR 88 | Ht 62.0 in | Wt 306.8 lb

## 2023-07-25 DIAGNOSIS — E782 Mixed hyperlipidemia: Secondary | ICD-10-CM | POA: Diagnosis not present

## 2023-07-25 DIAGNOSIS — E119 Type 2 diabetes mellitus without complications: Secondary | ICD-10-CM | POA: Diagnosis not present

## 2023-07-25 DIAGNOSIS — E559 Vitamin D deficiency, unspecified: Secondary | ICD-10-CM | POA: Diagnosis not present

## 2023-07-25 DIAGNOSIS — Z7985 Long-term (current) use of injectable non-insulin antidiabetic drugs: Secondary | ICD-10-CM

## 2023-07-25 DIAGNOSIS — E890 Postprocedural hypothyroidism: Secondary | ICD-10-CM | POA: Diagnosis not present

## 2023-07-25 DIAGNOSIS — Z7984 Long term (current) use of oral hypoglycemic drugs: Secondary | ICD-10-CM

## 2023-07-25 DIAGNOSIS — Z6841 Body Mass Index (BMI) 40.0 and over, adult: Secondary | ICD-10-CM

## 2023-07-25 MED ORDER — FREESTYLE LIBRE 3 PLUS SENSOR MISC: 2 refills | Status: DC

## 2023-07-25 MED ORDER — TIRZEPATIDE 2.5 MG/0.5ML ~~LOC~~ SOAJ: 2.5000 mg | SUBCUTANEOUS | 0 refills | Status: DC

## 2023-07-25 MED ORDER — ACCU-CHEK GUIDE ME W/DEVICE KIT
1.0000 | PACK | 0 refills | Status: DC
Start: 2023-07-25 — End: 2023-07-26

## 2023-07-25 MED ORDER — LEVOTHYROXINE SODIUM 50 MCG PO TABS: ORAL_TABLET | ORAL | 1 refills | Status: DC

## 2023-07-25 MED ORDER — ACCU-CHEK GUIDE TEST VI STRP: ORAL_STRIP | 3 refills | Status: DC

## 2023-07-25 MED ORDER — LANTUS SOLOSTAR 100 UNIT/ML ~~LOC~~ SOPN: 20.0000 [IU] | PEN_INJECTOR | Freq: Every day | SUBCUTANEOUS | 1 refills | Status: DC

## 2023-07-25 MED ORDER — FREESTYLE LIBRE 3 READER DEVI: 1.0000 | Freq: Once | 0 refills | Status: AC | PRN

## 2023-07-25 MED ORDER — ROSUVASTATIN CALCIUM 10 MG PO TABS: 10.0000 mg | ORAL_TABLET | Freq: Every evening | ORAL | 1 refills | Status: DC

## 2023-07-25 NOTE — Assessment & Plan Note (Signed)
 Body mass index is 56.15 kg/m.  -  trending down slightly/ encouraed  Lab Results  Component Value Date   TSH 7.030 (H) 07/07/2023      Contributing to doe and risk of GERD >>>   reviewed the need and the process to achieve and maintain neg calorie balance > defer f/u primary care including intermittently monitoring thyroid  status            Each maintenance medication was reviewed in detail including emphasizing most importantly the difference between maintenance and prns and under what circumstances the prns are to be triggered using an action plan format where appropriate.  Total time for H and P, chart review, counseling, reviewing hfa  device(s) and generating customized AVS unique to this office visit / same day charting = 23 min

## 2023-07-25 NOTE — Progress Notes (Signed)
 07/25/2023           Endocrinology follow-up note  Subjective:    Patient ID: Melissa Schmidt, female    DOB: [birth year], PCP Serita Danes, MD.   Past Medical History:  Diagnosis Date   Arthritis    Borderline diabetes    Celiac disease    Complication of anesthesia    shaking post anesthesia   Diabetes mellitus without complication (HCC)    Dyspnea    Hemorrhoids    Hyperthyroidism    Hypertriglyceridemia    PCOS (polycystic ovarian syndrome)    Past Surgical History:  Procedure Laterality Date   APPENDECTOMY  2002   CHOLECYSTECTOMY  11/2022   COLONOSCOPY N/A 06/14/2023   Procedure: COLONOSCOPY;  Surgeon: Suzette Espy, MD;  Location: [facility];  Service: Endoscopy;  Laterality: N/A;  815am, asa 3   COLONOSCOPY WITH PROPOFOL  N/A 05/25/2015   Dr.Rourk- non-bleeding hemorrhoids, the examination was o/w normal   ESOPHAGEAL DILATION N/A 05/25/2015   Procedure: ESOPHAGEAL DILATION;  Surgeon: Suzette Espy, MD;  Location: [facility];  Service: Endoscopy;  Laterality: N/A;   ESOPHAGOGASTRODUODENOSCOPY  04/2017   [place]: EGD with irregular Z-line s/p biopsy, benign-appearing esophageal stenosis s/p dilation, normal examined duodenum but no biopsies of duodenum. Pathology with junctional type GE mucosa with chronic inflammation and surface erosion, no metaplasia, negative dysplasia.    ESOPHAGOGASTRODUODENOSCOPY (EGD) WITH PROPOFOL  N/A 05/25/2015   Dr.Rourk- normal esophagus, dilated, gastric erosions, intact fundoplication, normal third portion of the duodenum, non-bleeding erosive gastropathy. duodenum bx= benign small bowel mucosa with patchy increased intraepithelial lymphocytes, mild villous blunting and mild crypt hyperplasia, no dysplasia or malignancy stomach bx= erosive gastritis with reactive changes   LIVER BIOPSY N/A 11/11/2022   Procedure: LIVER BIOPSY;  Surgeon: Awilda Bogus, MD;  Location: [facility];  Service: General;  Laterality: N/A;   NISSEN  FUNDOPLICATION  2005   Social History   Socioeconomic History   Marital status: Married    Spouse name: Not on file   Number of children: 1   Years of education: Not on file   Highest education level: Not on file  Occupational History   Occupation: in school  Tobacco Use   Smoking status: Never   Smokeless tobacco: Never  Vaping Use   Vaping status: Never Used  Substance and Sexual Activity   Alcohol use: No    Alcohol/week: 0.0 standard drinks of alcohol   Drug use: No   Sexual activity: Yes    Birth control/protection: None  Other Topics Concern   Not on file  Social History Narrative   Not on file   Social Drivers of Health   Financial Resource Strain: Not on file  Food Insecurity: Not on file  Transportation Needs: Not on file  Physical Activity: Not on file  Stress: Not on file  Social Connections: Not on file   Outpatient Encounter Medications as of 07/25/2023  Medication Sig   Blood Glucose Monitoring Suppl (ACCU-CHEK GUIDE ME) w/Device KIT 1 Piece by Does not apply route as directed.   Continuous Glucose Receiver (FREESTYLE LIBRE 3 READER) DEVI 1 Piece by Does not apply route once as needed for up to 1 dose.   Continuous Glucose Sensor (FREESTYLE LIBRE 3 PLUS SENSOR) MISC Change sensor every 15 days.   glucose blood (ACCU-CHEK GUIDE TEST) test strip Use to monitor glucose 4 times daily as instructed   tirzepatide (MOUNJARO) 2.5 MG/0.5ML Pen Inject 2.5 mg into the skin once  a week.   [DISCONTINUED] insulin  glargine (LANTUS SOLOSTAR) 100 UNIT/ML Solostar Pen Inject 10 Units into the skin at bedtime.   albuterol  (PROAIR  HFA) 108 (90 Base) MCG/ACT inhaler 2 puffs every 4 hours as needed only  if your can't catch your breath   Ascorbic Acid (VITAMIN C ADULT GUMMIES PO) Take 2 tablets by mouth daily.   budeson-glycopyrrolate -formoterol  (BREZTRI  AEROSPHERE) 160-9-4.8 MCG/ACT AERO Inhale 2 puffs into the lungs in the morning and at bedtime.   budesonide -formoterol   (SYMBICORT ) 80-4.5 MCG/ACT inhaler Take 2 puffs first thing in am and then another 2 puffs about 12 hours later.   Calcium  Carb-Cholecalciferol (CALCIUM  1000 + D PO) Take 1 tablet by mouth daily.   Cholecalciferol (VITAMIN D3) 125 MCG (5000 UT) CAPS Take 1 capsule (5,000 Units total) by mouth daily.   famotidine  (PEPCID ) 20 MG tablet One after supper   ferrous sulfate 325 (65 FE) MG EC tablet Take 325 mg by mouth daily with breakfast.   fluticasone (FLONASE) 50 MCG/ACT nasal spray Place 2 sprays into both nostrils daily as needed for allergies.   hydrocortisone  (ANUSOL -HC) 2.5 % rectal cream Place 1 application rectally 2 (two) times daily.   hydrOXYzine (ATARAX) 50 MG tablet Take 50 mg by mouth at bedtime as needed for anxiety.   insulin  glargine (LANTUS SOLOSTAR) 100 UNIT/ML Solostar Pen Inject 20 Units into the skin at bedtime.   levothyroxine  (SYNTHROID ) 200 MCG tablet TAKE 1 TABLET BY MOUTH ONCE DAILY BEFORE BREAKFAST   levothyroxine  (SYNTHROID ) 50 MCG tablet TAKE 1 TABLET BY MOUTH ONCE DAILY IN ADDITION TO YOUR TO MAKE A TOTAL DAILY DOSE OF 250 MCG   losartan (COZAAR) 50 MG tablet Take 1 tablet by mouth daily.   lurasidone (LATUDA) 80 MG TABS tablet Take 80 mg by mouth daily with breakfast.   metFORMIN (GLUCOPHAGE) 1000 MG tablet Take 1,000 mg by mouth 2 (two) times daily with a meal.    methocarbamol  (ROBAXIN ) 500 MG tablet Take 1 tablet (500 mg total) by mouth 3 (three) times daily.   Multiple Vitamin (MULTIVITAMIN) capsule Take 1 capsule by mouth daily.   ondansetron  (ZOFRAN ) 4 MG tablet Take 1 tablet (4 mg total) by mouth every 8 (eight) hours as needed.   pantoprazole  (PROTONIX ) 40 MG tablet TAKE 1 TABLET BY MOUTH ONCE DAILY BEFORE BREAKFAST   PARoxetine (PAXIL-CR) 25 MG 24 hr tablet Take 25 mg by mouth daily.   predniSONE  (DELTASONE ) 10 MG tablet Take  4 each am x 2 days,   2 each am x 2 days,  1 each am x 2 days and stop   propranolol  (INDERAL ) 20 MG tablet Take 10 mg by mouth  every 6 (six) hours as needed (anxiety).   rosuvastatin  (CRESTOR ) 10 MG tablet Take 1 tablet (10 mg total) by mouth at bedtime.   topiramate (TOPAMAX) 25 MG tablet Take 100 mg by mouth daily.   Vitamin D , Ergocalciferol , (DRISDOL ) 1.25 MG (50000 UNIT) CAPS capsule Take 1 capsule (50,000 Units total) by mouth every 7 (seven) days.   [DISCONTINUED] Dulaglutide  (TRULICITY ) 1.5 MG/0.5ML SOPN Inject 1.5 mg into the skin once a week.   [DISCONTINUED] levothyroxine  (SYNTHROID ) 25 MCG tablet TAKE 1 TABLET BY MOUTH ONCE DAILY IN ADDITION TO  YOUR 200MCG TO MAKE A TOTAL DAILY DOSE OF   [DISCONTINUED] rosuvastatin  (CRESTOR ) 5 MG tablet Take 1 tablet (5 mg total) by mouth at bedtime.   No facility-administered encounter medications on file as of 07/25/2023.    ALLERGIES: Allergies  Allergen Reactions   Montelukast Sodium Dermatitis    montelukast sodium   Singulair [Montelukast] Rash    VACCINATION STATUS:  There is no immunization history on file for this patient.   HPI  Melissa Schmidt is 48 y.o. 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  First treatment was administered in November 2020, with inadequate response.  She was treated again in January 2022.  She was initiated on levothyroxine  for RAI induced hypothyroidism during her prior visits.  She is currently on levothyroxine  225 mcg p.o. daily before breakfast.  Her previsit thyroid  function tests are still consistent with slight under replacement  She also has type 2 diabetes with recent loss of control with A1c of 11.4%.  In the interim, she was started on low-dose basal insulin  along with Trulicity  and metformin.   She does not monitor blood glucose.  -She presents with worsening weight gain coupled with most recent initiation of antidepressant.  she denies family history of thyroid  dysfunction nor any thyroid  cancer.  She is grieving the death of her brother by  suicide.                           Review of systems  Limited as above  Objective:    BP 116/82   Pulse 88   Ht 5' 2 (1.575 m)   Wt (!) 306 lb 12.8 oz (139.2 kg)   LMP  (LMP Unknown)   BMI 56.11 kg/m   Wt Readings from Last 3 Encounters:  07/25/23 (!) 306 lb 12.8 oz (139.2 kg)  07/24/23 (!) 307 lb (139.3 kg)  07/20/23 (!) 304 lb 6.4 oz (138.1 kg)                   Component Value Date/Time   NA 134 07/07/2023 1542   K 4.5 07/07/2023 1542   CL 97 07/07/2023 1542   CO2 20 07/07/2023 1542   GLUCOSE 391 (H) 07/07/2023 1542   GLUCOSE 419 (H) 04/12/2023 1720   BUN 10 07/07/2023 1542   CREATININE 1.01 (H) 07/07/2023 1542   CALCIUM  9.8 07/07/2023 1542   PROT 6.8 07/07/2023 1542   ALBUMIN 4.0 07/07/2023 1542   AST 77 (H) 07/07/2023 1542   ALT 52 (H) 07/07/2023 1542   ALKPHOS 101 07/07/2023 1542   BILITOT 0.4 07/07/2023 1542   GFRNONAA >60 04/12/2023 1720   GFRAA 131 11/22/2019 0000     CBC    Component Value Date/Time   WBC 10.0 07/05/2023 1642   WBC 10.2 04/12/2023 1720   RBC 5.38 (H) 07/05/2023 1642   RBC 5.68 (H) 04/12/2023 1720   HGB 13.8 07/05/2023 1642   HCT 44.3 07/05/2023 1642   PLT 297 07/05/2023 1642   MCV 82 07/05/2023 1642   MCH 25.7 (L) 07/05/2023 1642   MCH 25.2 (L) 04/12/2023 1720   MCHC 31.2 (L) 07/05/2023 1642   MCHC 31.4 04/12/2023 1720   RDW 14.4 07/05/2023 1642   LYMPHSABS 2.3 07/05/2023 1642   EOSABS 0.2 07/05/2023 1642   BASOSABS 0.1 07/05/2023 1642     Diabetic Labs (most recent): Lab Results  Component Value Date   HGBA1C 11.4 07/19/2023   HGBA1C 7.6 (H) 11/09/2022   HGBA1C 6.5 05/16/2022    Lipid Panel     Component Value Date/Time   CHOL 209 (H) 07/07/2023 1542   TRIG 302 (H) 07/07/2023 1542  HDL 30 (L) 07/07/2023 1542   CHOLHDL 7.0 (H) 07/07/2023 1542   LDLCALC 125 (H) 07/07/2023 1542     Thyroid  uptake and scan on October 20, 20 FINDINGS: Homogeneous tracer accumulation within both thyroid  lobes,  diffusely increased.   No focal areas of increased or decreased tracer localization.  4 hour I-123 uptake = 57% (normal 5-20%)  24 hour I-123 uptake = 68% (normal 10-30%)   IMPRESSION: Diffusely increased homogeneous tracer accumulation in both thyroid  lobes.   Increased 4 hour and 24 hour radio iodine uptakes consistent with hyperthyroidism.  Findings consistent with Graves disease.  I-131 thyroid  ablation on December 12, 2018  Her response was only partial. Repeat thyroid  uptake and scan on January 16, 2020 FINDINGS: Homogeneous tracer distribution in both thyroid  lobes.  No focal areas of increased or decreased tracer localization.   4 hour I-123 uptake = 27.7% (normal 5-20%),  24 hour I-123 uptake = 43.7% (normal 10-30%)   IMPRESSION: Normal thyroid  scan.  Mildly elevated 4 hour and 24 hour radio iodine uptakes.  Findings consistent with hyperthyroidism and Graves disease.   4 hour and 24 hour radio iodine uptakes have decreased since the previous exam.    Recent Results (from the past 2160 hours)  Pregnancy, urine POC     Status: None   Collection Time: 06/14/23  7:16 AM  Result Value Ref Range   Preg Test, Ur NEGATIVE NEGATIVE    Comment:        THE SENSITIVITY OF THIS METHODOLOGY IS >24 mIU/mL   Glucose, capillary     Status: Abnormal   Collection Time: 06/14/23  7:18 AM  Result Value Ref Range   Glucose-Capillary 245 (H) 70 - 99 mg/dL    Comment: Glucose reference range applies only to samples taken after fasting for at least 8 hours.  CBC w/Diff/Platelet     Status: Abnormal   Collection Time: 07/05/23  4:42 PM  Result Value Ref Range   WBC 10.0 3.4 - 10.8 x10E3/uL   RBC 5.38 (H) 3.77 - 5.28 x10E6/uL   Hemoglobin 13.8 11.1 - 15.9 g/dL   Hematocrit 16.1 09.6 - 46.6 %   MCV 82 79 - 97 fL   MCH 25.7 (L) 26.6 - 33.0 pg   MCHC 31.2 (L) 31.5 - 35.7 g/dL   RDW 04.5 40.9 - 81.1 %   Platelets 297 150 - 450 x10E3/uL   Neutrophils 68 Not Estab. %   Lymphs 23  Not Estab. %   Monocytes 5 Not Estab. %   Eos 2 Not Estab. %   Basos 1 Not Estab. %   Neutrophils Absolute 6.8 1.4 - 7.0 x10E3/uL   Lymphocytes Absolute 2.3 0.7 - 3.1 x10E3/uL   Monocytes Absolute 0.5 0.1 - 0.9 x10E3/uL   EOS (ABSOLUTE) 0.2 0.0 - 0.4 x10E3/uL   Basophils Absolute 0.1 0.0 - 0.2 x10E3/uL   Immature Granulocytes 1 Not Estab. %   Immature Grans (Abs) 0.1 0.0 - 0.1 x10E3/uL  Comprehensive Metabolic Panel (CMET)     Status: Abnormal   Collection Time: 07/05/23  4:42 PM  Result Value Ref Range   Glucose 368 (H) 70 - 99 mg/dL   BUN 10 6 - 24 mg/dL   Creatinine, Ser 9.14 0.57 - 1.00 mg/dL   eGFR 88 >78 GN/FAO/1.30   BUN/Creatinine Ratio 12 9 - 23   Sodium 131 (L) 134 - 144 mmol/L   Potassium 4.5 3.5 - 5.2 mmol/L   Chloride 95 (L) 96 - 106 mmol/L  CO2 19 (L) 20 - 29 mmol/L   Calcium  9.7 8.7 - 10.2 mg/dL   Total Protein 6.9 6.0 - 8.5 g/dL   Albumin 4.1 3.9 - 4.9 g/dL   Globulin, Total 2.8 1.5 - 4.5 g/dL   Bilirubin Total 0.3 0.0 - 1.2 mg/dL   Alkaline Phosphatase 102 44 - 121 IU/L   AST 77 (H) 0 - 40 IU/L   ALT 53 (H) 0 - 32 IU/L  Iron, TIBC and Ferritin Panel     Status: Abnormal   Collection Time: 07/05/23  4:42 PM  Result Value Ref Range   Total Iron Binding Capacity 268 250 - 450 ug/dL   UIBC 161 096 - 045 ug/dL   Iron 55 27 - 409 ug/dL   Iron Saturation 21 15 - 55 %   Ferritin 237 (H) 15 - 150 ng/mL  Vitamin D  (25 hydroxy)     Status: Abnormal   Collection Time: 07/05/23  4:42 PM  Result Value Ref Range   Vit D, 25-Hydroxy 19.0 (L) 30.0 - 100.0 ng/mL    Comment: Vitamin D  deficiency has been defined by the Institute of Medicine and an Endocrine Society practice guideline as a level of serum 25-OH vitamin D  less than 20 ng/mL (1,2). The Endocrine Society went on to further define vitamin D  insufficiency as a level between 21 and 29 ng/mL (2). 1. IOM (Institute of Medicine). 2010. Dietary reference    intakes for calcium  and D. [place]: The     [employer]. 2. Holick MF, Binkley [place], Bischoff-Ferrari HA, et al.    Evaluation, treatment, and prevention of vitamin D     deficiency: an Endocrine Society clinical practice    guideline. JCEM. 2011 Jul; 96(7):1911-30.   TSH     Status: Abnormal   Collection Time: 07/07/23  3:42 PM  Result Value Ref Range   TSH 7.030 (H) 0.450 - 4.500 uIU/mL  T4, free     Status: None   Collection Time: 07/07/23  3:42 PM  Result Value Ref Range   Free T4 1.42 0.82 - 1.77 ng/dL  Lipid panel     Status: Abnormal   Collection Time: 07/07/23  3:42 PM  Result Value Ref Range   Cholesterol, Total 209 (H) 100 - 199 mg/dL   Triglycerides 811 (H) 0 - 149 mg/dL   HDL 30 (L) >91 mg/dL   VLDL Cholesterol Cal 54 (H) 5 - 40 mg/dL   LDL Chol Calc (NIH) 478 (H) 0 - 99 mg/dL   Chol/HDL Ratio 7.0 (H) 0.0 - 4.4 ratio    Comment:                                   T. Chol/HDL Ratio                                             Men  Women                               1/2 Avg.Risk  3.4    3.3  Avg.Risk  5.0    4.4                                2X Avg.Risk  9.6    7.1                                3X Avg.Risk 23.4   11.0   Comprehensive metabolic panel     Status: Abnormal   Collection Time: 07/07/23  3:42 PM  Result Value Ref Range   Glucose 391 (H) 70 - 99 mg/dL   BUN 10 6 - 24 mg/dL   Creatinine, Ser 1.61 (H) 0.57 - 1.00 mg/dL   eGFR 70 >09 UE/AVW/0.98   BUN/Creatinine Ratio 10 9 - 23   Sodium 134 134 - 144 mmol/L   Potassium 4.5 3.5 - 5.2 mmol/L   Chloride 97 96 - 106 mmol/L   CO2 20 20 - 29 mmol/L   Calcium  9.8 8.7 - 10.2 mg/dL   Total Protein 6.8 6.0 - 8.5 g/dL   Albumin 4.0 3.9 - 4.9 g/dL   Globulin, Total 2.8 1.5 - 4.5 g/dL   Bilirubin Total 0.4 0.0 - 1.2 mg/dL   Alkaline Phosphatase 101 44 - 121 IU/L   AST 77 (H) 0 - 40 IU/L   ALT 52 (H) 0 - 32 IU/L  VITAMIN D  25 Hydroxy (Vit-D Deficiency, Fractures)     Status: Abnormal   Collection Time: 07/07/23   3:42 PM  Result Value Ref Range   Vit D, 25-Hydroxy 23.9 (L) 30.0 - 100.0 ng/mL    Comment: Vitamin D  deficiency has been defined by the Institute of Medicine and an Endocrine Society practice guideline as a level of serum 25-OH vitamin D  less than 20 ng/mL (1,2). The Endocrine Society went on to further define vitamin D  insufficiency as a level between 21 and 29 ng/mL (2). 1. IOM (Institute of Medicine). 2010. Dietary reference    intakes for calcium  and D. [place]: The    [employer]. 2. Holick MF, Binkley [place], Bischoff-Ferrari HA, et al.    Evaluation, treatment, and prevention of vitamin D     deficiency: an Endocrine Society clinical practice    guideline. JCEM. 2011 Jul; 96(7):1911-30.   Hemoglobin A1c     Status: None   Collection Time: 07/19/23 12:00 AM  Result Value Ref Range   Hemoglobin A1C 11.4    On February 21, 2020 she received second dose of RAI thyroid  ablative therapy: Patient then received the radiopharmaceutical without immediate complication.   RADIOPHARMACEUTICALS:  25.8 mCi I-131 sodium iodide orally   IMPRESSION: Per oral administration of I-131 sodium iodide for the treatment of recurrent hyperthyroidism.  Assessment & Plan:   1.  RAI induced hypothyroidism  2. Graves' disease-resolved 3.  Type 2 diabetes 4.  Vitamin D  deficiency 5.  Morbid obesity 6.  Hyperlipidemia  -She  has responded to the second treatment with RAI with clinical and biochemical hypothyroidism.    Her previsit labs are consistent with inadequate replacement.  I discussed and increase her levothyroxine  to 250 mcg p.o. daily before breakfast.    - We discussed about the correct intake of her thyroid  hormone, on empty stomach at fasting, with water , separated by at least 30 minutes from breakfast and other medications,  and separated by more than 4 hours from calcium , iron, multivitamins, acid reflux  medications (PPIs). -Patient is made aware of the fact that  thyroid  hormone replacement is needed for life, dose to be adjusted by periodic monitoring of thyroid  function tests.  Related to her metabolic syndrome involving morbid obesity, type 2 diabetes, hyperlipidemia, she was previously offered lifestyle medicine.    -Her engagement with lifestyle medicine nutrition is suboptimal. She will benefit with Mounjaro.  I discussed and switched her Trulicity  to Mounjaro 2.5 mg subcutaneously weekly.  She is advised to continue metformin 1000 mg p.o. twice daily.  - she acknowledges that there is a room for improvement in her food and drink choices. - Suggestion is made for her to avoid simple carbohydrates  from her diet including Cakes, Sweet Desserts, Ice Cream, Soda (diet and regular), Sweet Tea, Candies, Chips, Cookies, Store Bought Juices, Alcohol , Artificial Sweeteners,  Coffee Creamer, and Sugar-free Products, Lemonade. This will help patient to have more stable blood glucose profile and potentially avoid unintended weight gain.  The following Lifestyle Medicine recommendations according to American College of Lifestyle Medicine  [facility]) were discussed and and offered to patient and she  agrees to start the journey:  A. Whole Foods, Plant-Based Nutrition comprising of fruits and vegetables, plant-based proteins, whole-grain carbohydrates was discussed in detail with the patient.   A list for source of those nutrients were also provided to the patient.  Patient will use only water  or unsweetened tea for hydration. B.  The need to stay away from risky substances including alcohol, smoking; obtaining 7 to 9 hours of restorative sleep, at least 150 minutes of moderate intensity exercise weekly, the importance of healthy social connections,  and stress management techniques were discussed. C.  A full color page of  Calorie density of various food groups per pound showing examples of each food groups was provided to the patient.   She presents with severe  dyslipidemia.  I advised her to increase Crestor  to 10 mg p.o. nightly.   Side effects and precautions discussed with her. She is advised to continue vitamin D3 5000 units daily.   - I advised her to maintain close follow up with Serita Danes, MD for primary care needs.   I spent  42  minutes in the care of the patient today including review of labs from CMP, Lipids, Thyroid  Function, Hematology (current and previous including abstractions from other facilities); face-to-face time discussing  her blood glucose readings/logs, discussing hypoglycemia and hyperglycemia episodes and symptoms, medications doses, her options of short and long term treatment based on the latest standards of care / guidelines;  discussion about incorporating lifestyle medicine;  and documenting the encounter. Risk reduction counseling performed per USPSTF guidelines to reduce  obesity and cardiovascular risk factors.     Please refer to Patient Instructions for Blood Glucose Monitoring and Insulin /Medications Dosing Guide  in media tab for additional information. Please  also refer to  Patient Self Inventory in the Media  tab for reviewed elements of pertinent patient history.  Melissa Schmidt participated in the discussions, expressed understanding, and voiced agreement with the above plans.  All questions were answered to her satisfaction. she is encouraged to contact clinic should she have any questions or concerns prior to her return visit.   Follow up plan: Return in about 3 months (around 10/25/2023) for F/U with Pre-visit Labs, Meter/CGM/Logs, A1c here.   Thank you for involving me in the care of this pleasant patient, and I will continue to update you with her progress.  Kalvin Orf, MD [facility] Phone: [phone]  Fax: [phone]   07/25/2023, 5:07 PM  This note was partially dictated with voice recognition software. Similar sounding words can  be transcribed inadequately or may not  be corrected upon review.

## 2023-07-25 NOTE — Assessment & Plan Note (Addendum)
 Onset with COVID 2020 with baseline wt 298  - initial pulmonary eval 05/02/2023 wt 306 limited by knees > sob  - 05/02/2023  After extensive coaching inhaler device,  effectiveness =    50% vs baseline < 25%  :  rec trial of symbicort  80 2bid and max gerd rx - Allergy screen 07/24/2023 >  Eos 0. /  IgE    Improving on symbicort  so no change rx needed but needs to return with all meds in hand using a trust but verify approach to confirm accurate Medication  Reconciliation The principal here is that until we are certain that the  patients are doing what we've asked, it makes no sense to ask them to do more.

## 2023-07-25 NOTE — Patient Instructions (Signed)

## 2023-07-26 ENCOUNTER — Other Ambulatory Visit: Payer: Self-pay

## 2023-07-26 DIAGNOSIS — E119 Type 2 diabetes mellitus without complications: Secondary | ICD-10-CM

## 2023-07-26 MED ORDER — ACCU-CHEK GUIDE ME W/DEVICE KIT
PACK | 0 refills | Status: AC
Start: 2023-07-26 — End: ?

## 2023-07-27 ENCOUNTER — Ambulatory Visit: Payer: Self-pay | Admitting: Internal Medicine

## 2023-07-27 LAB — IGE: IgE (Immunoglobulin E), Serum: 29 [IU]/mL (ref 6–495)

## 2023-07-27 LAB — CBC WITH DIFFERENTIAL/PLATELET
Basophils Absolute: 0.1 10*3/uL (ref 0.0–0.2)
Basos: 1 %
EOS (ABSOLUTE): 0.3 10*3/uL (ref 0.0–0.4)
Eos: 2 %
Hematocrit: 40.5 % (ref 34.0–46.6)
Hemoglobin: 13 g/dL (ref 11.1–15.9)
Immature Grans (Abs): 0.1 10*3/uL (ref 0.0–0.1)
Immature Granulocytes: 1 %
Lymphocytes Absolute: 2.6 10*3/uL (ref 0.7–3.1)
Lymphs: 24 %
MCH: 26.1 pg — ABNORMAL LOW (ref 26.6–33.0)
MCHC: 32.1 g/dL (ref 31.5–35.7)
MCV: 81 fL (ref 79–97)
Monocytes Absolute: 0.6 10*3/uL (ref 0.1–0.9)
Monocytes: 6 %
Neutrophils Absolute: 7.2 10*3/uL — ABNORMAL HIGH (ref 1.4–7.0)
Neutrophils: 66 %
Platelets: 310 10*3/uL (ref 150–450)
RBC: 4.98 x10E6/uL (ref 3.77–5.28)
RDW: 14.4 % (ref 11.7–15.4)
WBC: 10.8 10*3/uL (ref 3.4–10.8)

## 2023-07-31 ENCOUNTER — Ambulatory Visit: Payer: Self-pay | Admitting: Gastroenterology

## 2023-07-31 NOTE — Progress Notes (Signed)
ATC x1.  LMTCB. 

## 2023-08-01 ENCOUNTER — Other Ambulatory Visit: Payer: Self-pay | Admitting: "Endocrinology

## 2023-08-01 DIAGNOSIS — E890 Postprocedural hypothyroidism: Secondary | ICD-10-CM

## 2023-08-01 NOTE — Progress Notes (Signed)
 ATC x2 LMTCB sending letter

## 2023-08-02 NOTE — Telephone Encounter (Signed)
 Copied from CRM [id number]. Topic: Clinical - Lab/Test Results >> Jul 31, 2023 12:04 PM Corean SAUNDERS wrote: Reason for CRM: Lab results relayed to patient per the note from Dr. Darlean on 6/19 - Patient verbalized understanding and states she had no further questions.  nfn

## 2023-08-09 ENCOUNTER — Ambulatory Visit: Admitting: Nutrition

## 2023-08-09 ENCOUNTER — Ambulatory Visit: Admitting: Gastroenterology

## 2023-08-16 ENCOUNTER — Ambulatory Visit: Admitting: Nutrition

## 2023-08-21 ENCOUNTER — Telehealth: Payer: Self-pay | Admitting: Physical Medicine and Rehabilitation

## 2023-08-21 NOTE — Telephone Encounter (Signed)
 Patient called. She would like to know what she could take for pain that will not make her blood pressure go up? Her cb# [phone]

## 2023-08-22 NOTE — Telephone Encounter (Signed)
 Appointment made with [facility] to F/U back pain

## 2023-08-28 ENCOUNTER — Ambulatory Visit: Payer: Self-pay | Admitting: Gastroenterology

## 2023-08-28 DIAGNOSIS — K7581 Nonalcoholic steatohepatitis (NASH): Secondary | ICD-10-CM

## 2023-09-04 ENCOUNTER — Encounter: Payer: Self-pay | Admitting: Nutrition

## 2023-09-04 ENCOUNTER — Encounter: Attending: "Endocrinology | Admitting: Nutrition

## 2023-09-04 VITALS — Ht 63.0 in | Wt 305.0 lb

## 2023-09-04 DIAGNOSIS — E1165 Type 2 diabetes mellitus with hyperglycemia: Secondary | ICD-10-CM | POA: Diagnosis present

## 2023-09-04 DIAGNOSIS — Z6841 Body Mass Index (BMI) 40.0 and over, adult: Secondary | ICD-10-CM | POA: Diagnosis present

## 2023-09-04 LAB — IGG, IGA, IGM
IgA/Immunoglobulin A, Serum: 282 mg/dL (ref 87–352)
IgG (Immunoglobin G), Serum: 1165 mg/dL (ref 586–1602)
IgM (Immunoglobulin M), Srm: 159 mg/dL (ref 26–217)

## 2023-09-04 LAB — ALPHA-1-ANTITRYPSIN DEFICIENCY

## 2023-09-04 LAB — MITOCHONDRIAL/SMOOTH MUSCLE AB PNL
Mitochondrial Ab: <20 U (ref 0.0–20.0)
Smooth Muscle Ab: 6 U (ref 0–19)

## 2023-09-04 LAB — HEPATITIS B SURFACE ANTIGEN: Hepatitis B Surface Ag: NEGATIVE

## 2023-09-04 LAB — ENHANCED LIVER FIBROSIS (ELF)

## 2023-09-04 LAB — CERULOPLASMIN: Ceruloplasmin: 34.1 mg/dL (ref 19.0–39.0)

## 2023-09-04 LAB — ANA W/REFLEX IF POSITIVE: Anti Nuclear Antibody (ANA): NEGATIVE

## 2023-09-04 LAB — HEPATITIS C ANTIBODY: Hep C Virus Ab: NONREACTIVE

## 2023-09-04 NOTE — Progress Notes (Signed)
 Medical Nutrition Therapy  Appointment Start time:  1300  Appointment End time:  1400  Primary concerns today: Type 2 Dm  Referral diagnosis: E11.8 Preferred learning style: No Preference  Learning readiness: Ready    NUTRITION ASSESSMENT  48 yr old wfemale referred for Uncontrolled Type 2 DM. A1C 11.4%. Sees Dr. Lenis, Endocrinology at [facility]. Currently on 20 units of Lantus , 1000 mg BID of Metformin. Has a libre CGM but needs to go get more sensors. Hasn't been testing regularly. FBS 200's. Admits she needs to cut out processed foods and eat healthier foods. Does buy some gluten free foods at times. Recently hurt her ankle in her dad's yard and wants to go get it checked out at Urgent care. Not currently working. Not active physically.  Is on depression medications and thinks that is making her blood sugars elevated and her craving for sweets. Has seen a therapist in the past but not one currently. Would like to see a therapist to help with her depression and emotional eating.  Talked to Dr. Lenis and he advised to increase Lantus   to 30 units at night. Will get libre sensors back on today.  PMH: DM Type 2, Hyperlipidemia, HTN, Depression, Celiac disease, arthrisit and thyroid  issues. She is willing to work on meal planning and meal prepping to prepare healthier foods to help with weight loss and improved blood sugars.    Clinical Medical Hx:  Past Medical History:  Diagnosis Date   Arthritis    Borderline diabetes    Celiac disease    Complication of anesthesia    shaking post anesthesia   Diabetes mellitus without complication (HCC)    Dyspnea    Hemorrhoids    Hyperthyroidism    Hypertriglyceridemia    PCOS (polycystic ovarian syndrome)    Medications:  Current Outpatient Medications on File Prior to Visit  Medication Sig Dispense Refill   albuterol  (PROAIR  HFA) 108 (90 Base) MCG/ACT inhaler 2 puffs every 4 hours as needed only  if your can't catch your breath 18 g 11    Ascorbic Acid (VITAMIN C ADULT GUMMIES PO) Take 2 tablets by mouth daily.     Blood Glucose Monitoring Suppl (ACCU-CHEK GUIDE ME) w/Device KIT Use to check blood glucose four times daily as directed. 1 kit 0   budeson-glycopyrrolate -formoterol  (BREZTRI  AEROSPHERE) 160-9-4.8 MCG/ACT AERO Inhale 2 puffs into the lungs in the morning and at bedtime. 1 each    budesonide -formoterol  (SYMBICORT ) 80-4.5 MCG/ACT inhaler Take 2 puffs first thing in am and then another 2 puffs about 12 hours later. 1 each 12   Calcium  Carb-Cholecalciferol (CALCIUM  1000 + D PO) Take 1 tablet by mouth daily.     Cholecalciferol (VITAMIN D3) 125 MCG (5000 UT) CAPS Take 1 capsule (5,000 Units total) by mouth daily. 90 capsule 0   Continuous Glucose Receiver (FREESTYLE LIBRE 3 READER) DEVI 1 Piece by Does not apply route once as needed for up to 1 dose. 1 each 0   Continuous Glucose Sensor (FREESTYLE LIBRE 3 PLUS SENSOR) MISC Change sensor every 15 days. 2 each 2   famotidine  (PEPCID ) 20 MG tablet One after supper 30 tablet 11   ferrous sulfate 325 (65 FE) MG EC tablet Take 325 mg by mouth daily with breakfast.     fluticasone (FLONASE) 50 MCG/ACT nasal spray Place 2 sprays into both nostrils daily as needed for allergies.     glucose blood (ACCU-CHEK GUIDE TEST) test strip Use to monitor glucose 4 times daily as  instructed 150 each 3   hydrocortisone  (ANUSOL -HC) 2.5 % rectal cream Place 1 application rectally 2 (two) times daily. 30 g 1   hydrOXYzine (ATARAX) 50 MG tablet Take 50 mg by mouth at bedtime as needed for anxiety.     insulin  glargine (LANTUS  SOLOSTAR) 100 UNIT/ML Solostar Pen Inject 20 Units into the skin at bedtime. 15 mL 1   levothyroxine  (SYNTHROID ) 200 MCG tablet TAKE 1 TABLET BY MOUTH ONCE DAILY BEFORE BREAKFAST 90 tablet 0   levothyroxine  (SYNTHROID ) 50 MCG tablet TAKE 1 TABLET BY MOUTH ONCE DAILY IN ADDITION TO YOUR TO MAKE A TOTAL DAILY DOSE OF 250 MCG 90 tablet 1   losartan (COZAAR) 50 MG tablet Take 1  tablet by mouth daily.     lurasidone (LATUDA) 80 MG TABS tablet Take 80 mg by mouth daily with breakfast.     metFORMIN (GLUCOPHAGE) 1000 MG tablet Take 1,000 mg by mouth 2 (two) times daily with a meal.      methocarbamol  (ROBAXIN ) 500 MG tablet Take 1 tablet (500 mg total) by mouth 3 (three) times daily. 90 tablet 0   Multiple Vitamin (MULTIVITAMIN) capsule Take 1 capsule by mouth daily.     ondansetron  (ZOFRAN ) 4 MG tablet Take 1 tablet (4 mg total) by mouth every 8 (eight) hours as needed. 30 tablet 1   pantoprazole  (PROTONIX ) 40 MG tablet TAKE 1 TABLET BY MOUTH ONCE DAILY BEFORE BREAKFAST 90 tablet 3   PARoxetine (PAXIL-CR) 25 MG 24 hr tablet Take 25 mg by mouth daily.     predniSONE  (DELTASONE ) 10 MG tablet Take  4 each am x 2 days,   2 each am x 2 days,  1 each am x 2 days and stop 14 tablet 2   propranolol  (INDERAL ) 20 MG tablet Take 10 mg by mouth every 6 (six) hours as needed (anxiety).     rosuvastatin  (CRESTOR ) 10 MG tablet Take 1 tablet (10 mg total) by mouth at bedtime. 90 tablet 1   tirzepatide  (MOUNJARO ) 2.5 MG/0.5ML Pen Inject 2.5 mg into the skin once a week. 6 mL 0   topiramate (TOPAMAX) 25 MG tablet Take 100 mg by mouth daily.     Vitamin D , Ergocalciferol , (DRISDOL ) 1.25 MG (50000 UNIT) CAPS capsule Take 1 capsule (50,000 Units total) by mouth every 7 (seven) days. 8 capsule 1   No current facility-administered medications on file prior to visit.    Labs:  Lab Results  Component Value Date   HGBA1C 11.4 07/19/2023      Latest Ref Rng & Units 07/07/2023    3:42 PM 07/05/2023    4:42 PM 04/12/2023    5:20 PM  CMP  Glucose 70 - 99 mg/dL 608  631  580   BUN 6 - 24 mg/dL 10  10  15    Creatinine 0.57 - 1.00 mg/dL 8.98  9.16  9.10   Sodium 134 - 144 mmol/L 134  131  131   Potassium 3.5 - 5.2 mmol/L 4.5  4.5  3.8   Chloride 96 - 106 mmol/L 97  95  97   CO2 20 - 29 mmol/L 20  19  21    Calcium  8.7 - 10.2 mg/dL 9.8  9.7  9.5   Total Protein 6.0 - 8.5 g/dL 6.8  6.9  8.0    Total Bilirubin 0.0 - 1.2 mg/dL 0.4  0.3  0.4   Alkaline Phos 44 - 121 IU/L 101  102  86   AST 0 -  40 IU/L 77  77  67   ALT 0 - 32 IU/L 52  53  55    Lipid Panel     Component Value Date/Time   CHOL 209 (H) 07/07/2023 1542   TRIG 302 (H) 07/07/2023 1542   HDL 30 (L) 07/07/2023 1542   CHOLHDL 7.0 (H) 07/07/2023 1542   LDLCALC 125 (H) 07/07/2023 1542   LABVLDL 54 (H) 07/07/2023 1542    Notable Signs/Symptoms: Increased thirst, fatigue,   Lifestyle & Dietary Hx Lives with her husband. Not working  Estimated daily fluid intake: 80-100 oz Supplements: Vit C, Vit D,  Sleep: 7-8 Stress / self-care: None Current average weekly physical activity: ADL hurt her ankle  24-Hr Dietary Recall First Meal: banana or oatmeal or low sugar cereal or eggs and bacon Snack:  Second Meal: Cheeseburger , french fries ,  unsweet tea Snack:  Third Meal: spaghetti 1 cup, Gluten free, Snack:  Beverages: water   Estimated Energy Needs Calories: 1200 Carbohydrate: 135g Protein: 90g Fat: 33g   NUTRITION DIAGNOSIS  NI-1.7 Predicted excessive energy intake As related to high calorie high carb diet.  As evidenced by A1C 11.4%, BMI 54.   NUTRITION INTERVENTION  Nutrition education (E-1) on the following topics:  Nutrition and Diabetes education provided on My Plate, CHO counting, meal planning, portion sizes, timing of meals, avoiding snacks between meals unless having a low blood sugar, target ranges for A1C and blood sugars, signs/symptoms and treatment of hyper/hypoglycemia, monitoring blood sugars, taking medications as prescribed, benefits of exercising 30 minutes per day and prevention of complications of DM.  Lifestyle Medicine  - Whole Food, Plant Predominant Nutrition is highly recommended: Eat Plenty of vegetables, Mushrooms, fruits, Legumes, Whole Grains, Nuts, seeds in lieu of processed meats, processed snacks/pastries red meat, poultry, eggs.    -It is better to avoid simple  carbohydrates including: Cakes, Sweet Desserts, Ice Cream, Soda (diet and regular), Sweet Tea, Candies, Chips, Cookies, Store Bought Juices, Alcohol in Excess of  1-2 drinks a day, Lemonade,  Artificial Sweeteners, Doughnuts, Coffee Creamers, Sugar-free Products, etc, etc.  This is not a complete list.....  Exercise: If you are able: 30 -60 minutes a day ,4 days a week, or 150 minutes a week.  The longer the better.  Combine stretch, strength, and aerobic activities.  If you were told in the past that you have high risk for cardiovascular diseases, you may seek evaluation by your heart doctor prior to initiating moderate to intense exercise programs.   Handouts Provided Include  Lifestyle Medicine handouts S/s of hyper/hypoglycemia BS log  Learning Style & Readiness for Change Teaching method utilized: Visual & Auditory  Demonstrated degree of understanding via: Teach Back  Barriers to learning/adherence to lifestyle change: depression  Goals Established by Pt Goals Eat three meals per day at times we discussed Don't skip meals Read food labels for gluten Cut out processed foods of chips, cookies, fast food and junk food Go pick up [employer] and start using again. Increase insulin  to 30 units at night. Increase fruits, vegetables and foods from garden Get A1C down to 7%   MONITORING & EVALUATION Dietary intake, weekly physical activity, and blood sugars  in 1 month.  Next Steps  Patient is to work on meal planning and meal prepping.SABRA

## 2023-09-04 NOTE — Patient Instructions (Addendum)
 Goals Eat three meals per day at times we discussed Don't skip meals Read food labels for gluten Cut out processed foods of chips, cookies, fast food and junk food Go pick up [employer] and start using again. Increase insulin  to 30 units at night. Increase fruits, vegetables and foods from garden Get A1C down to 7% Keep a food journal

## 2023-09-05 NOTE — Telephone Encounter (Signed)
 noted

## 2023-09-05 NOTE — Telephone Encounter (Signed)
 Dena: thanks! I have put in order for ELF again.

## 2023-09-06 ENCOUNTER — Ambulatory Visit: Admitting: Physical Medicine and Rehabilitation

## 2023-09-07 ENCOUNTER — Other Ambulatory Visit: Payer: Self-pay | Admitting: Gastroenterology

## 2023-09-07 DIAGNOSIS — K219 Gastro-esophageal reflux disease without esophagitis: Secondary | ICD-10-CM

## 2023-09-07 DIAGNOSIS — R1319 Other dysphagia: Secondary | ICD-10-CM

## 2023-09-12 ENCOUNTER — Ambulatory Visit (INDEPENDENT_AMBULATORY_CARE_PROVIDER_SITE_OTHER): Admitting: Physical Medicine and Rehabilitation

## 2023-09-12 ENCOUNTER — Encounter: Payer: Self-pay | Admitting: Physical Medicine and Rehabilitation

## 2023-09-12 DIAGNOSIS — M47817 Spondylosis without myelopathy or radiculopathy, lumbosacral region: Secondary | ICD-10-CM | POA: Diagnosis not present

## 2023-09-12 DIAGNOSIS — M47816 Spondylosis without myelopathy or radiculopathy, lumbar region: Secondary | ICD-10-CM

## 2023-09-12 DIAGNOSIS — G8929 Other chronic pain: Secondary | ICD-10-CM | POA: Diagnosis not present

## 2023-09-12 DIAGNOSIS — M545 Low back pain, unspecified: Secondary | ICD-10-CM

## 2023-09-12 NOTE — Progress Notes (Unsigned)
 Melissa Schmidt - 48 y.o. female MRN [id number]  Date of birth: Apr 27, 1975  Office Visit Note: Visit Date: 09/12/2023 PCP: Marchelle Clem Pitts, MD Referred by: Marchelle Clem Pitts, *  Subjective: Chief Complaint  Patient presents with   Lower Back - Pain   HPI: Melissa Schmidt is a 48 y.o. female who comes in today for evaluation of chronic, worsening and severe bilateral lower back pain. Pain ongoing for several years, worsens with household activities such as washing dishes, cooking and standing. Also reports severe pain with bending. She describes her pain as sore and aching sensation, currently rates as 8 out of 10. Some relief of pain with home exercise regimen, rest and use of medications. She was not able to complete physical therapy last year due to issues with gallbladder surgery. Lumbar MRI imaging from 2024 shows bilateral moderate to severe facet arthropathy at L4-L5 and L5-S1. We discussed performing facet joint injections last year, however advised against this type of injection due to elevated hemoglobin A1C. Most recent hemoglobin A1C was 11.4 on 07/19/2023. Patient denies focal weakness, numbness and tingling.      Review of Systems  Musculoskeletal:  Positive for back pain.  Neurological:  Negative for tingling, sensory change, focal weakness and weakness.  All other systems reviewed and are negative.  Otherwise per HPI.  Assessment & Plan: Visit Diagnoses:    ICD-10-CM   1. Chronic bilateral low back pain without sciatica  M54.50 Ambulatory referral to Physical Medicine Rehab   G89.29     2. Spondylosis without myelopathy or radiculopathy, lumbosacral region  M47.817 Ambulatory referral to Physical Medicine Rehab    3. Facet arthropathy, lumbar  M47.816 Ambulatory referral to Physical Medicine Rehab       Plan: Findings:  Chronic, worsening and severe bilateral axial back pain. No radicular symptoms down the legs. Patient continues to have severe pain despite good  conservative therapies such as home exercise regimen, rest and use of medications. Patients clinical presentation and exam are consistent with facet mediated pain. There is moderate to severe facet arthropathy at L4-L5 and L5-S1.  She does have pain with lumbar extension today.  We discussed treatment plan in detail today.  Next step is to perform diagnostic bilateral L4-L5 and L5-S1 medial branch blocks under fluoroscopic guidance.  If good relief of pain with medial branch blocks we discussed possibility of longer sustained pain relief with radiofrequency ablation.  I discussed injection procedure in detail today, she has no questions at this time. No red flag symptoms noted upon exam today.     Meds & Orders: No orders of the defined types were placed in this encounter.   Orders Placed This Encounter  Procedures   Ambulatory referral to Physical Medicine Rehab    Follow-up: Return for Bilateral L4-L5 and L5-S1 medial branch blocks.   Procedures: No procedures performed      Clinical History: CLINICAL DATA:  Low back pain, symptoms persist with > 6 wks treatment   EXAM: MRI LUMBAR SPINE WITHOUT CONTRAST   TECHNIQUE: Multiplanar, multisequence MR imaging of the lumbar spine was performed. No intravenous contrast was administered.   COMPARISON:  None Available.   FINDINGS: Segmentation:  Standard.   Alignment:  Physiologic.   Vertebrae:  No fracture, evidence of discitis, or bone lesion.   Conus medullaris and cauda equina: Conus extends to the L1-L2 disc space level. Conus and cauda equina appear normal.   Paraspinal and other soft tissues: Negative.   Disc  levels:   T12-L1: Unremarkable   L1-L2: Unremarkable   L2-L3: Unremarkable   L3-L4: Mild bilateral facet degenerative change. No spinal canal or neural foraminal narrowing.   L4-L5: Moderate to severe bilateral facet degenerative change. No spinal canal narrowing. No neural foraminal narrowing.   L5-S1:  Moderate to severe bilateral facet degenerative change. No spinal canal narrowing. No neural foraminal narrowing.   IMPRESSION: 1. Moderate to severe bilateral facet degenerative change at L4-L5 and L5-S1. 2. No spinal canal or neural foraminal narrowing.     Electronically Signed   By: [name] M.D.   On: 01/04/2023 17:18   She reports that she has never smoked. She has never used smokeless tobacco.  Recent Labs    11/09/22 0919 07/19/23 0000  HGBA1C 7.6* 11.4    Objective:  VS:  HT:    WT:   BMI:     BP:   HR: bpm  TEMP: ( )  RESP:  Physical Exam Vitals and nursing note reviewed.  HENT:     Head: Normocephalic and atraumatic.     Right Ear: External ear normal.     Left Ear: External ear normal.     Nose: Nose normal.     Mouth/Throat:     Mouth: Mucous membranes are moist.  Eyes:     Extraocular Movements: Extraocular movements intact.  Cardiovascular:     Rate and Rhythm: Normal rate.     Pulses: Normal pulses.  Pulmonary:     Effort: Pulmonary effort is normal.  Abdominal:     General: Abdomen is flat. There is no distension.  Musculoskeletal:        General: Tenderness present.     Cervical back: Normal range of motion.     Comments: Patient rises from seated position to standing without difficulty. Pain noted with lumbar extension and facet loading. 5/5 strength noted with bilateral hip flexion, knee flexion/extension, ankle dorsiflexion/plantarflexion and EHL. No clonus noted bilaterally. No pain upon palpation of greater trochanters. No pain with internal/external rotation of bilateral hips. Sensation intact bilaterally. Negative slump test bilaterally. Ambulates without aid, gait steady.     Skin:    General: Skin is warm and dry.     Capillary Refill: Capillary refill takes less than 2 seconds.  Neurological:     General: No focal deficit present.     Mental Status: She is alert and oriented to person, place, and time.  Psychiatric:         Mood and Affect: Mood normal.        Behavior: Behavior normal.     Ortho Exam  Imaging: No results found.  Past Medical/Family/Surgical/Social History: Medications & Allergies reviewed per EMR, new medications updated. 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   Hemorrhoids 12/11/2018   Graves' disease 11/27/2018   RUQ abdominal pain 10/10/2018   Celiac disease 10/10/2018   GERD (gastroesophageal reflux disease) 10/10/2018   Hyperthyroidism 10/10/2017   Uncontrolled type 2 diabetes mellitus with hyperglycemia (  HCC) 10/10/2017   Mucosal abnormality of stomach    Dysphagia    Positive autoantibody screening for celiac disease 04/29/2015   Fatty liver 03/19/2015   Colicky RUQ abdominal pain 03/19/2015   RLQ abdominal pain 03/19/2015   Rectal bleeding 03/19/2015   Past Medical History:  Diagnosis Date   Arthritis    Borderline diabetes    Celiac disease    Complication of anesthesia    shaking post anesthesia   Diabetes mellitus without complication (HCC)    Dyspnea    Hemorrhoids    Hyperthyroidism    Hypertriglyceridemia    PCOS (polycystic ovarian syndrome)    Family History  Problem Relation Age of Onset   Irritable bowel syndrome Mother    Colon polyps Mother        ? 5 year surveillance   Inflammatory bowel disease Neg Hx    Colon cancer Neg Hx    Liver disease Neg Hx    Past Surgical History:   Procedure Laterality Date   APPENDECTOMY  2002   CHOLECYSTECTOMY  11/2022   COLONOSCOPY N/A 06/14/2023   Procedure: COLONOSCOPY;  Surgeon: Shaaron Lamar HERO, MD;  Location: [facility];  Service: Endoscopy;  Laterality: N/A;  815am, asa 3   COLONOSCOPY WITH PROPOFOL  N/A 05/25/2015   Dr.Rourk- non-bleeding hemorrhoids, the examination was o/w normal   ESOPHAGEAL DILATION N/A 05/25/2015   Procedure: ESOPHAGEAL DILATION;  Surgeon: Lamar HERO Shaaron, MD;  Location: [facility];  Service: Endoscopy;  Laterality: N/A;   ESOPHAGOGASTRODUODENOSCOPY  04/2017   [place]: EGD with irregular Z-line s/p biopsy, benign-appearing esophageal stenosis s/p dilation, normal examined duodenum but no biopsies of duodenum. Pathology with junctional type GE mucosa with chronic inflammation and surface erosion, no metaplasia, negative dysplasia.    ESOPHAGOGASTRODUODENOSCOPY (EGD) WITH PROPOFOL  N/A 05/25/2015   Dr.Rourk- normal esophagus, dilated, gastric erosions, intact fundoplication, normal third portion of the duodenum, non-bleeding erosive gastropathy. duodenum bx= benign small bowel mucosa with patchy increased intraepithelial lymphocytes, mild villous blunting and mild crypt hyperplasia, no dysplasia or malignancy stomach bx= erosive gastritis with reactive changes   LIVER BIOPSY N/A 11/11/2022   Procedure: LIVER BIOPSY;  Surgeon: Kallie Manuelita BROCKS, MD;  Location: [facility];  Service: General;  Laterality: N/A;   NISSEN FUNDOPLICATION  2005   Social History   Occupational History   Occupation: in school  Tobacco Use   Smoking status: Never   Smokeless tobacco: Never  Vaping Use   Vaping status: Never Used  Substance and Sexual Activity   Alcohol use: No    Alcohol/week: 0.0 standard drinks of alcohol   Drug use: No   Sexual activity: Yes    Birth control/protection: None

## 2023-09-12 NOTE — Progress Notes (Unsigned)
 Pain Scale   Average Pain 6 Patient advising she has chronic lower back pain        +Driver, -BT, -Dye Allergies.

## 2023-09-14 ENCOUNTER — Other Ambulatory Visit: Payer: Self-pay | Admitting: Physical Medicine and Rehabilitation

## 2023-09-14 MED ORDER — DIAZEPAM 5 MG PO TABS
ORAL_TABLET | ORAL | 0 refills | Status: DC
Start: 2023-09-14 — End: 2023-12-21

## 2023-09-15 LAB — ENHANCED LIVER FIBROSIS (ELF): ELF(TM) Score: 10.57 — ABNORMAL HIGH (ref <9.80)

## 2023-09-20 ENCOUNTER — Encounter: Admitting: Family

## 2023-09-25 ENCOUNTER — Other Ambulatory Visit: Payer: Self-pay | Admitting: "Endocrinology

## 2023-09-25 DIAGNOSIS — E890 Postprocedural hypothyroidism: Secondary | ICD-10-CM

## 2023-09-26 ENCOUNTER — Encounter: Payer: Self-pay | Admitting: Family

## 2023-09-26 ENCOUNTER — Ambulatory Visit (INDEPENDENT_AMBULATORY_CARE_PROVIDER_SITE_OTHER): Admitting: Family

## 2023-09-26 ENCOUNTER — Other Ambulatory Visit (INDEPENDENT_AMBULATORY_CARE_PROVIDER_SITE_OTHER): Payer: Self-pay

## 2023-09-26 DIAGNOSIS — S93401A Sprain of unspecified ligament of right ankle, initial encounter: Secondary | ICD-10-CM | POA: Diagnosis not present

## 2023-09-26 DIAGNOSIS — M25571 Pain in right ankle and joints of right foot: Secondary | ICD-10-CM

## 2023-09-26 NOTE — Progress Notes (Signed)
 Office Visit Note   Patient: Melissa Schmidt           Date of Birth: Jan 26, 1976           MRN: [id number] Visit Date: 09/26/2023              Requested by: Marchelle Clem Pitts, MD [address] [place],  [place] [id number] PCP: Marchelle Clem Pitts, MD  Chief Complaint  Patient presents with   Right Ankle - Pain      HPI: The patient is a 48 year old woman who is seen today for complaint of right ankle pain after stepping in a hole twisting her ankle and falling in the yard about 3-1/2 weeks ago.  She is in sure whether she inverted her everted her ankle  Primarily having medial ankle pain as the day progresses her pain worsens she feels she has been walking along the lateral column to offload the medial ankle and foot.  She  Continues to have swelling which worsens over the course of the day her pain has not subsided over the last 3 weeks she has not tried using any braces or medications Reports history of ankle sprains on the right last sprain was about 10 years ago she reports good result with physical therapy is interested in proceeding with therapy at this time   Assessment & Plan: Visit Diagnoses:  1. Pain in right ankle and joints of right foot   2. Sprain of unspecified ligament of right ankle, initial encounter     Plan: Given an order for physical therapy she will handcarried this to the office of her choice and [place] follow-up in office in 6 weeks if she fails to improve  Placed in an ASO  Follow-Up Instructions: No follow-ups on file.   Right Ankle Exam  Swelling: mild  Range of Motion  The patient has normal right ankle ROM.  Tests  Anterior drawer: negative  Other  Erythema: absent Pulse: present   Comments:  ATFL and deltoid tenderness      Patient is alert, oriented, no adenopathy, well-dressed, normal affect, normal respiratory effort.     Imaging: No results found. No images are attached to the encounter.  Labs: Lab Results   Component Value Date   HGBA1C 11.4 07/19/2023   HGBA1C 7.6 (H) 11/09/2022   HGBA1C 6.5 05/16/2022     Lab Results  Component Value Date   ALBUMIN 4.0 07/07/2023   ALBUMIN 4.1 07/05/2023   ALBUMIN 3.6 04/12/2023    No results found for: MG Lab Results  Component Value Date   VD25OH 23.9 (L) 07/07/2023   VD25OH 19.0 (L) 07/05/2023   VD25OH 25.4 (L) 10/28/2022    No results found for: PREALBUMIN    Latest Ref Rng & Units 07/24/2023    3:36 PM 07/05/2023    4:42 PM 04/12/2023    5:20 PM  CBC EXTENDED  WBC 3.4 - 10.8 x10E3/uL 10.8  10.0  10.2   RBC 3.77 - 5.28 x10E6/uL 4.98  5.38  5.68   Hemoglobin 11.1 - 15.9 g/dL 86.9  86.1  85.6   HCT 34.0 - 46.6 % 40.5  44.3  45.5   Platelets 150 - 450 x10E3/uL 310  297  331   NEUT# 1.4 - 7.0 x10E3/uL 7.2  6.8    Lymph# 0.7 - 3.1 x10E3/uL 2.6  2.3       There is no height or weight on file to calculate BMI.  Orders:  Orders Placed This  Encounter  Procedures   XR Ankle Complete Right   No orders of the defined types were placed in this encounter.    Procedures: No procedures performed  Clinical Data: No additional findings.  ROS:  All other systems negative, except as noted in the HPI. Review of Systems  Objective: Vital Signs: There were no vitals taken for this visit.  Specialty Comments:  CLINICAL DATA:  Low back pain, symptoms persist with > 6 wks treatment   EXAM: MRI LUMBAR SPINE WITHOUT CONTRAST   TECHNIQUE: Multiplanar, multisequence MR imaging of the lumbar spine was performed. No intravenous contrast was administered.   COMPARISON:  None Available.   FINDINGS: Segmentation:  Standard.   Alignment:  Physiologic.   Vertebrae:  No fracture, evidence of discitis, or bone lesion.   Conus medullaris and cauda equina: Conus extends to the L1-L2 disc space level. Conus and cauda equina appear normal.   Paraspinal and other soft tissues: Negative.   Disc levels:   T12-L1: Unremarkable    L1-L2: Unremarkable   L2-L3: Unremarkable   L3-L4: Mild bilateral facet degenerative change. No spinal canal or neural foraminal narrowing.   L4-L5: Moderate to severe bilateral facet degenerative change. No spinal canal narrowing. No neural foraminal narrowing.   L5-S1: Moderate to severe bilateral facet degenerative change. No spinal canal narrowing. No neural foraminal narrowing.   IMPRESSION: 1. Moderate to severe bilateral facet degenerative change at L4-L5 and L5-S1. 2. No spinal canal or neural foraminal narrowing.     Electronically Signed   By: [name] M.D.   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   Hemorrhoids 12/11/2018   Graves' disease 11/27/2018   RUQ abdominal pain 10/10/2018   Celiac disease 10/10/2018   GERD (gastroesophageal reflux disease) 10/10/2018   Hyperthyroidism 10/10/2017   Uncontrolled type 2 diabetes mellitus with hyperglycemia (HCC) 10/10/2017   Mucosal abnormality of stomach    Dysphagia    Positive autoantibody screening for celiac disease 04/29/2015   Fatty liver 03/19/2015   Colicky RUQ abdominal pain 03/19/2015   RLQ abdominal pain 03/19/2015   Rectal bleeding 03/19/2015   Past Medical History:  Diagnosis Date   Arthritis    Borderline diabetes    Celiac disease    Complication of anesthesia    shaking post anesthesia    Diabetes mellitus without complication (HCC)    Dyspnea    Hemorrhoids    Hyperthyroidism    Hypertriglyceridemia    PCOS (polycystic ovarian syndrome)     Family History  Problem Relation Age of Onset   Irritable bowel syndrome Mother    Colon polyps Mother        ? 5 year surveillance   Inflammatory bowel disease Neg Hx    Colon cancer Neg Hx    Liver disease Neg Hx     Past Surgical History:  Procedure Laterality Date   APPENDECTOMY  2002   CHOLECYSTECTOMY  11/2022   COLONOSCOPY N/A 06/14/2023  Procedure: COLONOSCOPY;  Surgeon: Shaaron Lamar HERO, MD;  Location: [facility];  Service: Endoscopy;  Laterality: N/A;  815am, asa 3   COLONOSCOPY WITH PROPOFOL  N/A 05/25/2015   Dr.Rourk- non-bleeding hemorrhoids, the examination was o/w normal   ESOPHAGEAL DILATION N/A 05/25/2015   Procedure: ESOPHAGEAL DILATION;  Surgeon: Lamar HERO Shaaron, MD;  Location: [facility];  Service: Endoscopy;  Laterality: N/A;   ESOPHAGOGASTRODUODENOSCOPY  04/2017   [place]: EGD with irregular Z-line s/p biopsy, benign-appearing esophageal stenosis s/p dilation, normal examined duodenum but no biopsies of duodenum. Pathology with junctional type GE mucosa with chronic inflammation and surface erosion, no metaplasia, negative dysplasia.    ESOPHAGOGASTRODUODENOSCOPY (EGD) WITH PROPOFOL  N/A 05/25/2015   Dr.Rourk- normal esophagus, dilated, gastric erosions, intact fundoplication, normal third portion of the duodenum, non-bleeding erosive gastropathy. duodenum bx= benign small bowel mucosa with patchy increased intraepithelial lymphocytes, mild villous blunting and mild crypt hyperplasia, no dysplasia or malignancy stomach bx= erosive gastritis with reactive changes   LIVER BIOPSY N/A 11/11/2022   Procedure: LIVER BIOPSY;  Surgeon: Kallie Manuelita BROCKS, MD;  Location: [facility];  Service: General;  Laterality: N/A;   NISSEN FUNDOPLICATION  2005   Social History   Occupational History   Occupation: in school   Tobacco Use   Smoking status: Never   Smokeless tobacco: Never  Vaping Use   Vaping status: Never Used  Substance and Sexual Activity   Alcohol use: No    Alcohol/week: 0.0 standard drinks of alcohol   Drug use: No   Sexual activity: Yes    Birth control/protection: None

## 2023-09-28 ENCOUNTER — Ambulatory Visit: Admitting: Gastroenterology

## 2023-10-02 NOTE — Addendum Note (Signed)
 Addended by: JEANELL GRAEME RAMAN on: 10/02/2023 03:43 PM   Modules accepted: Orders

## 2023-10-02 NOTE — Telephone Encounter (Signed)
 Mindy/Tammy: please arrange US  abdomen complete due to history of MASH.

## 2023-10-02 NOTE — Telephone Encounter (Signed)
 Noted

## 2023-10-03 ENCOUNTER — Telehealth: Payer: Self-pay | Admitting: Physical Medicine and Rehabilitation

## 2023-10-03 NOTE — Telephone Encounter (Signed)
 Patient called said that she needs to reschedule. She don't have a ride. CB#[phone]

## 2023-10-03 NOTE — Telephone Encounter (Signed)
 LM for patient to call and reschedule or if she want to keep appt. And ride with [employer]

## 2023-10-04 ENCOUNTER — Institutional Professional Consult (permissible substitution) (INDEPENDENT_AMBULATORY_CARE_PROVIDER_SITE_OTHER): Admitting: Otolaryngology

## 2023-10-05 ENCOUNTER — Ambulatory Visit: Admitting: Nutrition

## 2023-10-05 ENCOUNTER — Ambulatory Visit (HOSPITAL_COMMUNITY)

## 2023-10-12 ENCOUNTER — Ambulatory Visit (HOSPITAL_COMMUNITY)
Admission: RE | Admit: 2023-10-12 | Discharge: 2023-10-12 | Disposition: A | Discharge status: Home / Self Care | Source: Ambulatory Visit | Attending: Gastroenterology | Admitting: Gastroenterology

## 2023-10-12 ENCOUNTER — Encounter: Admitting: Physical Medicine and Rehabilitation

## 2023-10-12 DIAGNOSIS — K7581 Nonalcoholic steatohepatitis (NASH): Secondary | ICD-10-CM | POA: Insufficient documentation

## 2023-10-18 ENCOUNTER — Ambulatory Visit: Payer: Self-pay | Admitting: Gastroenterology

## 2023-10-18 ENCOUNTER — Other Ambulatory Visit: Payer: Self-pay | Admitting: *Deleted

## 2023-10-18 DIAGNOSIS — K7581 Nonalcoholic steatohepatitis (NASH): Secondary | ICD-10-CM

## 2023-10-18 DIAGNOSIS — R748 Abnormal levels of other serum enzymes: Secondary | ICD-10-CM

## 2023-10-18 NOTE — Telephone Encounter (Signed)
 Liver bx ordered. Central scheduling will contact patient to schedule

## 2023-10-23 ENCOUNTER — Encounter: Payer: Self-pay | Admitting: Nutrition

## 2023-10-23 ENCOUNTER — Encounter: Attending: "Endocrinology | Admitting: Nutrition

## 2023-10-23 VITALS — Ht 63.0 in | Wt 297.0 lb

## 2023-10-23 DIAGNOSIS — Z6841 Body Mass Index (BMI) 40.0 and over, adult: Secondary | ICD-10-CM | POA: Insufficient documentation

## 2023-10-23 DIAGNOSIS — E1165 Type 2 diabetes mellitus with hyperglycemia: Secondary | ICD-10-CM | POA: Insufficient documentation

## 2023-10-23 NOTE — Progress Notes (Unsigned)
 Medical Nutrition Therapy  Appointment Start time: 1520  Appointment End time:  1600  Primary concerns today: Type 2 Dm  Referral diagnosis: E11.8 Preferred learning style: No Preference  Learning readiness: Ready    NUTRITION ASSESSMENT Dm Follow up. I am engaged now and am working hard to [facility] blood sugars better and improve my Lost 8 lbs. Drinking more water  and unsweet tea. Cut out juices and processed foods and sweet tea. Eating healthier foods, watching portions and timing of meals. Feels better. Less bloated. She is engaged and making excellent progress. Sprained ankle (?) is feeling better and going to get PT to help it. Libre CMG on. TIR 96% !!!!! Amazing improvement.  4% high. No hypoglycemia. Tolerating Lantus  30 units at nigh, Metformin 1000 mg BID,  GMI 6.3%.  Clinical Medical Hx:  Past Medical History:  Diagnosis Date   Arthritis    Borderline diabetes    Celiac disease    Complication of anesthesia    shaking post anesthesia   Diabetes mellitus without complication (HCC)    Dyspnea    Hemorrhoids    Hyperthyroidism    Hypertriglyceridemia    PCOS (polycystic ovarian syndrome)    Medications:  Current Outpatient Medications on File Prior to Visit  Medication Sig Dispense Refill   albuterol  (PROAIR  HFA) 108 (90 Base) MCG/ACT inhaler 2 puffs every 4 hours as needed only  if your can't catch your breath 18 g 11   Ascorbic Acid (VITAMIN C ADULT GUMMIES PO) Take 2 tablets by mouth daily.     Blood Glucose Monitoring Suppl (ACCU-CHEK GUIDE ME) w/Device KIT Use to check blood glucose four times daily as directed. 1 kit 0   budeson-glycopyrrolate -formoterol  (BREZTRI  AEROSPHERE) 160-9-4.8 MCG/ACT AERO Inhale 2 puffs into the lungs in the morning and at bedtime. 1 each    budesonide -formoterol  (SYMBICORT ) 80-4.5 MCG/ACT inhaler Take 2 puffs first thing in am and then another 2 puffs about 12 hours later. 1 each 12   Calcium  Carb-Cholecalciferol (CALCIUM  1000 + D PO)  Take 1 tablet by mouth daily.     Cholecalciferol (VITAMIN D3) 125 MCG (5000 UT) CAPS Take 1 capsule (5,000 Units total) by mouth daily. 90 capsule 0   Continuous Glucose Receiver (FREESTYLE LIBRE 3 READER) DEVI 1 Piece by Does not apply route once as needed for up to 1 dose. 1 each 0   Continuous Glucose Sensor (FREESTYLE LIBRE 3 PLUS SENSOR) MISC Change sensor every 15 days. 2 each 2   diazepam  (VALIUM ) 5 MG tablet Take one tablet by mouth with light food one hour prior to procedure. 1 tablet 0   famotidine  (PEPCID ) 20 MG tablet One after supper 30 tablet 11   ferrous sulfate 325 (65 FE) MG EC tablet Take 325 mg by mouth daily with breakfast.     fluticasone (FLONASE) 50 MCG/ACT nasal spray Place 2 sprays into both nostrils daily as needed for allergies.     glucose blood (ACCU-CHEK GUIDE TEST) test strip Use to monitor glucose 4 times daily as instructed 150 each 3   hydrocortisone  (ANUSOL -HC) 2.5 % rectal cream Place 1 application rectally 2 (two) times daily. 30 g 1   hydrOXYzine (ATARAX) 50 MG tablet Take 50 mg by mouth at bedtime as needed for anxiety. (Patient not taking: Reported on 09/04/2023)     insulin  glargine (LANTUS  SOLOSTAR) 100 UNIT/ML Solostar Pen Inject 20 Units into the skin at bedtime. 15 mL 1   levothyroxine  (SYNTHROID ) 200 MCG tablet TAKE 1 TABLET BY  MOUTH ONCE DAILY BEFORE BREAKFAST 90 tablet 0   levothyroxine  (SYNTHROID ) 50 MCG tablet TAKE 1 TABLET BY MOUTH ONCE DAILY IN ADDITION TO YOUR TO MAKE A TOTAL DAILY DOSE OF 250 MCG 90 tablet 1   losartan (COZAAR) 50 MG tablet Take 1 tablet by mouth daily.     lurasidone (LATUDA) 80 MG TABS tablet Take 80 mg by mouth daily with breakfast.     metFORMIN (GLUCOPHAGE) 1000 MG tablet Take 1,000 mg by mouth 2 (two) times daily with a meal.      methocarbamol  (ROBAXIN ) 500 MG tablet Take 1 tablet (500 mg total) by mouth 3 (three) times daily. 90 tablet 0   Multiple Vitamin (MULTIVITAMIN) capsule Take 1 capsule by mouth daily.      ondansetron  (ZOFRAN ) 4 MG tablet Take 1 tablet (4 mg total) by mouth every 8 (eight) hours as needed. 30 tablet 1   pantoprazole  (PROTONIX ) 40 MG tablet TAKE 1 TABLET BY MOUTH ONCE DAILY BEFORE BREAKFAST 90 tablet 3   PARoxetine (PAXIL-CR) 25 MG 24 hr tablet Take 25 mg by mouth daily.     predniSONE  (DELTASONE ) 10 MG tablet Take  4 each am x 2 days,   2 each am x 2 days,  1 each am x 2 days and stop 14 tablet 2   propranolol  (INDERAL ) 20 MG tablet Take 10 mg by mouth every 6 (six) hours as needed (anxiety).     rosuvastatin  (CRESTOR ) 10 MG tablet Take 1 tablet (10 mg total) by mouth at bedtime. 90 tablet 1   tirzepatide  (MOUNJARO ) 2.5 MG/0.5ML Pen Inject 2.5 mg into the skin once a week. 6 mL 0   topiramate (TOPAMAX) 25 MG tablet Take 100 mg by mouth daily.     Vitamin D , Ergocalciferol , (DRISDOL ) 1.25 MG (50000 UNIT) CAPS capsule Take 1 capsule (50,000 Units total) by mouth every 7 (seven) days. 8 capsule 1   No current facility-administered medications on file prior to visit.    Labs:  Lab Results  Component Value Date   HGBA1C 11.4 07/19/2023      Latest Ref Rng & Units 07/07/2023    3:42 PM 07/05/2023    4:42 PM 04/12/2023    5:20 PM  CMP  Glucose 70 - 99 mg/dL 608  631  580   BUN 6 - 24 mg/dL 10  10  15    Creatinine 0.57 - 1.00 mg/dL 8.98  9.16  9.10   Sodium 134 - 144 mmol/L 134  131  131   Potassium 3.5 - 5.2 mmol/L 4.5  4.5  3.8   Chloride 96 - 106 mmol/L 97  95  97   CO2 20 - 29 mmol/L 20  19  21    Calcium  8.7 - 10.2 mg/dL 9.8  9.7  9.5   Total Protein 6.0 - 8.5 g/dL 6.8  6.9  8.0   Total Bilirubin 0.0 - 1.2 mg/dL 0.4  0.3  0.4   Alkaline Phos 44 - 121 IU/L 101  102  86   AST 0 - 40 IU/L 77  77  67   ALT 0 - 32 IU/L 52  53  55    Lipid Panel     Component Value Date/Time   CHOL 209 (H) 07/07/2023 1542   TRIG 302 (H) 07/07/2023 1542   HDL 30 (L) 07/07/2023 1542   CHOLHDL 7.0 (H) 07/07/2023 1542   LDLCALC 125 (H) 07/07/2023 1542   LABVLDL 54 (H) 07/07/2023 1542  Notable Signs/Symptoms: Increased thirst, fatigue,   Lifestyle & Dietary Hx Lives with her husband. Not working  Estimated daily fluid intake: 80-100 oz Supplements: Vit C, Vit D,  Sleep: 7-8 Stress / self-care: None Current average weekly physical activity: ADL hurt her ankle  24-Hr Dietary Recall First Meal: Egg whites, GF Bread or oatmeal and fruit Lunch: chicken, baked beans, water  Dinner chicken, sweet potatoes, peas , green beans, water  Fruits strawberries and banana and grapes   Beverages: water   Estimated Energy Needs Calories: 1200 Carbohydrate: 135g Protein: 90g Fat: 33g   NUTRITION DIAGNOSIS  NI-1.7 Predicted excessive energy intake As related to high calorie high carb diet.  As evidenced by A1C 11.4%, BMI 54.   NUTRITION INTERVENTION  Nutrition education (E-1) on the following topics:  Nutrition and Diabetes education provided on My Plate, CHO counting, meal planning, portion sizes, timing of meals, avoiding snacks between meals unless having a low blood sugar, target ranges for A1C and blood sugars, signs/symptoms and treatment of hyper/hypoglycemia, monitoring blood sugars, taking medications as prescribed, benefits of exercising 30 minutes per day and prevention of complications of DM.  Lifestyle Medicine  - Whole Food, Plant Predominant Nutrition is highly recommended: Eat Plenty of vegetables, Mushrooms, fruits, Legumes, Whole Grains, Nuts, seeds in lieu of processed meats, processed snacks/pastries red meat, poultry, eggs.    -It is better to avoid simple carbohydrates including: Cakes, Sweet Desserts, Ice Cream, Soda (diet and regular), Sweet Tea, Candies, Chips, Cookies, Store Bought Juices, Alcohol in Excess of  1-2 drinks a day, Lemonade,  Artificial Sweeteners, Doughnuts, Coffee Creamers, Sugar-free Products, etc, etc.  This is not a complete list.....  Exercise: If you are able: 30 -60 minutes a day ,4 days a week, or 150 minutes a week.  The  longer the better.  Combine stretch, strength, and aerobic activities.  If you were told in the past that you have high risk for cardiovascular diseases, you may seek evaluation by your heart doctor prior to initiating moderate to intense exercise programs.   Handouts Provided Include  Lifestyle Medicine handouts S/s of hyper/hypoglycemia BS log  Learning Style & Readiness for Change Teaching method utilized: Visual & Auditory  Demonstrated degree of understanding via: Teach Back  Barriers to learning/adherence to lifestyle change: depression  Goals Established by Pt Goals Keep up the fantastic job!!! Lose 2-3 lbs per month Focus on whole plant based foods. A gallon of water  per day Walk 30 minutes Get A1C down to 6.5% or below.   MONITORING & EVALUATION Dietary intake, weekly physical activity, and blood sugars  in 3 month.  Next Steps  Patient is to work on meal planning and meal prepping.SABRA

## 2023-10-23 NOTE — Patient Instructions (Signed)
 Goals Keep up the fantastic job!!! Lose 2-3 lbs per month Focus on whole plant based foods. A gallon of water  per day Walk 30 minutes Get A1C down to 6.5% or below.

## 2023-10-25 ENCOUNTER — Encounter: Payer: Self-pay | Admitting: "Endocrinology

## 2023-10-25 ENCOUNTER — Ambulatory Visit (INDEPENDENT_AMBULATORY_CARE_PROVIDER_SITE_OTHER): Admitting: "Endocrinology

## 2023-10-25 VITALS — BP 100/72 | HR 84 | Ht 62.0 in | Wt 294.2 lb

## 2023-10-25 DIAGNOSIS — E119 Type 2 diabetes mellitus without complications: Secondary | ICD-10-CM | POA: Diagnosis not present

## 2023-10-25 DIAGNOSIS — E559 Vitamin D deficiency, unspecified: Secondary | ICD-10-CM

## 2023-10-25 DIAGNOSIS — Z7985 Long-term (current) use of injectable non-insulin antidiabetic drugs: Secondary | ICD-10-CM | POA: Diagnosis not present

## 2023-10-25 DIAGNOSIS — E890 Postprocedural hypothyroidism: Secondary | ICD-10-CM | POA: Diagnosis not present

## 2023-10-25 DIAGNOSIS — Z7984 Long term (current) use of oral hypoglycemic drugs: Secondary | ICD-10-CM | POA: Diagnosis not present

## 2023-10-25 DIAGNOSIS — E782 Mixed hyperlipidemia: Secondary | ICD-10-CM

## 2023-10-25 DIAGNOSIS — Z6841 Body Mass Index (BMI) 40.0 and over, adult: Secondary | ICD-10-CM

## 2023-10-25 LAB — POCT GLYCOSYLATED HEMOGLOBIN (HGB A1C): HbA1c, POC (controlled diabetic range): 7.8 % — AB (ref 0.0–7.0)

## 2023-10-25 LAB — LIPID PANEL
Chol/HDL Ratio: 3.6 ratio (ref 0.0–4.4)
Cholesterol, Total: 116 mg/dL (ref 100–199)
HDL: 32 mg/dL — ABNORMAL LOW (ref >39)
LDL Chol Calc (NIH): 58 mg/dL (ref 0–99)
Triglycerides: 153 mg/dL — ABNORMAL HIGH (ref 0–149)
VLDL Cholesterol Cal: 26 mg/dL (ref 5–40)

## 2023-10-25 LAB — T4, FREE: Free T4: 2.09 ng/dL — ABNORMAL HIGH (ref 0.82–1.77)

## 2023-10-25 LAB — TSH: TSH: 0.056 u[IU]/mL — ABNORMAL LOW (ref 0.450–4.500)

## 2023-10-25 MED ORDER — TIRZEPATIDE 5 MG/0.5ML ~~LOC~~ SOAJ: 5.0000 mg | SUBCUTANEOUS | 0 refills | Status: AC

## 2023-10-25 MED ORDER — LANTUS SOLOSTAR 100 UNIT/ML ~~LOC~~ SOPN: 20.0000 [IU] | PEN_INJECTOR | Freq: Every day | SUBCUTANEOUS | 1 refills | Status: DC

## 2023-10-25 MED ORDER — ROSUVASTATIN CALCIUM 20 MG PO TABS: 20.0000 mg | ORAL_TABLET | Freq: Every evening | ORAL | 1 refills | Status: DC

## 2023-10-25 NOTE — Progress Notes (Signed)
 10/25/2023           Endocrinology follow-up note  Subjective:    Patient ID: Melissa Schmidt, female    DOB: 09/30/1975, PCP Marchelle Clem Pitts, MD.   Past Medical History:  Diagnosis Date   Arthritis    Borderline diabetes    Celiac disease    Complication of anesthesia    shaking post anesthesia   Diabetes mellitus without complication (HCC)    Dyspnea    Hemorrhoids    Hyperthyroidism    Hypertriglyceridemia    PCOS (polycystic ovarian syndrome)    Past Surgical History:  Procedure Laterality Date   APPENDECTOMY  2002   CHOLECYSTECTOMY  11/2022   COLONOSCOPY N/A 06/14/2023   Procedure: COLONOSCOPY;  Surgeon: Shaaron Lamar HERO, MD;  Location: [facility];  Service: Endoscopy;  Laterality: N/A;  815am, asa 3   COLONOSCOPY WITH PROPOFOL  N/A 05/25/2015   Dr.Rourk- non-bleeding hemorrhoids, the examination was o/w normal   ESOPHAGEAL DILATION N/A 05/25/2015   Procedure: ESOPHAGEAL DILATION;  Surgeon: Lamar HERO Shaaron, MD;  Location: [facility];  Service: Endoscopy;  Laterality: N/A;   ESOPHAGOGASTRODUODENOSCOPY  04/2017   [place]: EGD with irregular Z-line s/p biopsy, benign-appearing esophageal stenosis s/p dilation, normal examined duodenum but no biopsies of duodenum. Pathology with junctional type GE mucosa with chronic inflammation and surface erosion, no metaplasia, negative dysplasia.    ESOPHAGOGASTRODUODENOSCOPY (EGD) WITH PROPOFOL  N/A 05/25/2015   Dr.Rourk- normal esophagus, dilated, gastric erosions, intact fundoplication, normal third portion of the duodenum, non-bleeding erosive gastropathy. duodenum bx= benign small bowel mucosa with patchy increased intraepithelial lymphocytes, mild villous blunting and mild crypt hyperplasia, no dysplasia or malignancy stomach bx= erosive gastritis with reactive changes   LIVER BIOPSY N/A 11/11/2022   Procedure: LIVER BIOPSY;  Surgeon: Kallie Manuelita BROCKS, MD;  Location: [facility];  Service: General;  Laterality: N/A;   NISSEN  FUNDOPLICATION  2005   Social History   Socioeconomic History   Marital status: Married    Spouse name: Not on file   Number of children: 1   Years of education: Not on file   Highest education level: Not on file  Occupational History   Occupation: in school  Tobacco Use   Smoking status: Never   Smokeless tobacco: Never  Vaping Use   Vaping status: Never Used  Substance and Sexual Activity   Alcohol use: No    Alcohol/week: 0.0 standard drinks of alcohol   Drug use: No   Sexual activity: Yes    Birth control/protection: None  Other Topics Concern   Not on file  Social History Narrative   Not on file   Social Drivers of Health   Financial Resource Strain: Not on file  Food Insecurity: Not on file  Transportation Needs: Not on file  Physical Activity: Not on file  Stress: Not on file  Social Connections: Not on file   Outpatient Encounter Medications as of 10/25/2023  Medication Sig   tirzepatide  (MOUNJARO ) 5 MG/0.5ML Pen Inject 5 mg into the skin once a week.   [DISCONTINUED] insulin  glargine (LANTUS  SOLOSTAR) 100 UNIT/ML Solostar Pen Inject 20 Units into the skin at bedtime. (Patient taking differently: Inject 30 Units into the skin at bedtime.)   albuterol  (PROAIR  HFA) 108 (90 Base) MCG/ACT inhaler 2 puffs every 4 hours as needed only  if your can't catch your breath   Ascorbic Acid (VITAMIN C ADULT GUMMIES PO) Take 2 tablets by mouth daily.   Blood Glucose Monitoring Suppl (  ACCU-CHEK GUIDE ME) w/Device KIT Use to check blood glucose four times daily as directed.   budeson-glycopyrrolate -formoterol  (BREZTRI  AEROSPHERE) 160-9-4.8 MCG/ACT AERO Inhale 2 puffs into the lungs in the morning and at bedtime.   budesonide -formoterol  (SYMBICORT ) 80-4.5 MCG/ACT inhaler Take 2 puffs first thing in am and then another 2 puffs about 12 hours later.   Calcium  Carb-Cholecalciferol (CALCIUM  1000 + D PO) Take 1 tablet by mouth daily.   Cholecalciferol (VITAMIN D3) 125 MCG (5000 UT)  CAPS Take 1 capsule (5,000 Units total) by mouth daily.   Continuous Glucose Receiver (FREESTYLE LIBRE 3 READER) DEVI 1 Piece by Does not apply route once as needed for up to 1 dose.   Continuous Glucose Sensor (FREESTYLE LIBRE 3 PLUS SENSOR) MISC Change sensor every 15 days.   diazepam  (VALIUM ) 5 MG tablet Take one tablet by mouth with light food one hour prior to procedure.   famotidine  (PEPCID ) 20 MG tablet One after supper   ferrous sulfate 325 (65 FE) MG EC tablet Take 325 mg by mouth daily with breakfast.   fluticasone (FLONASE) 50 MCG/ACT nasal spray Place 2 sprays into both nostrils daily as needed for allergies.   glucose blood (ACCU-CHEK GUIDE TEST) test strip Use to monitor glucose 4 times daily as instructed   hydrocortisone  (ANUSOL -HC) 2.5 % rectal cream Place 1 application rectally 2 (two) times daily.   hydrOXYzine (ATARAX) 50 MG tablet Take 50 mg by mouth at bedtime as needed for anxiety. (Patient not taking: Reported on 09/04/2023)   insulin  glargine (LANTUS  SOLOSTAR) 100 UNIT/ML Solostar Pen Inject 20 Units into the skin at bedtime.   levothyroxine  (SYNTHROID ) 200 MCG tablet TAKE 1 TABLET BY MOUTH ONCE DAILY BEFORE BREAKFAST   losartan (COZAAR) 50 MG tablet Take 1 tablet by mouth daily.   lurasidone (LATUDA) 80 MG TABS tablet Take 80 mg by mouth daily with breakfast.   metFORMIN (GLUCOPHAGE) 1000 MG tablet Take 1,000 mg by mouth 2 (two) times daily with a meal.    methocarbamol  (ROBAXIN ) 500 MG tablet Take 1 tablet (500 mg total) by mouth 3 (three) times daily.   Multiple Vitamin (MULTIVITAMIN) capsule Take 1 capsule by mouth daily.   ondansetron  (ZOFRAN ) 4 MG tablet Take 1 tablet (4 mg total) by mouth every 8 (eight) hours as needed.   pantoprazole  (PROTONIX ) 40 MG tablet TAKE 1 TABLET BY MOUTH ONCE DAILY BEFORE BREAKFAST   PARoxetine (PAXIL-CR) 25 MG 24 hr tablet Take 25 mg by mouth daily.   propranolol  (INDERAL ) 20 MG tablet Take 10 mg by mouth every 6 (six) hours as needed  (anxiety).   rosuvastatin  (CRESTOR ) 10 MG tablet Take 1 tablet (10 mg total) by mouth at bedtime.   topiramate (TOPAMAX) 25 MG tablet Take 100 mg by mouth daily.   Vitamin D , Ergocalciferol , (DRISDOL ) 1.25 MG (50000 UNIT) CAPS capsule Take 1 capsule (50,000 Units total) by mouth every 7 (seven) days.   [DISCONTINUED] levothyroxine  (SYNTHROID ) 50 MCG tablet TAKE 1 TABLET BY MOUTH ONCE DAILY IN ADDITION TO YOUR TO MAKE A TOTAL DAILY DOSE OF 250 MCG   [DISCONTINUED] predniSONE  (DELTASONE ) 10 MG tablet Take  4 each am x 2 days,   2 each am x 2 days,  1 each am x 2 days and stop   [DISCONTINUED] tirzepatide  (MOUNJARO ) 2.5 MG/0.5ML Pen Inject 2.5 mg into the skin once a week.   No facility-administered encounter medications on file as of 10/25/2023.    ALLERGIES: Allergies  Allergen Reactions   Montelukast  Sodium Dermatitis    montelukast sodium   Doxycycline Rash   Singulair [Montelukast] Rash    VACCINATION STATUS:  There is no immunization history on file for this patient.   HPI  Melissa Schmidt is 48 y.o. 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  First treatment was administered in November 2020, with inadequate response.  She was treated again in January 2022.  She was initiated on levothyroxine  for RAI induced hypothyroidism during her prior visits.  She is currently on levothyroxine  250 mcg p.o. daily before breakfast.  She has had better engagement in lifestyle measures, current presents with loss of weight on Mounjaro .  Her previsit labs are consistent with over replacement.    She also has type 2 diabetes with recent loss of control with A1c of 11.4%.  She was given Mounjaro , continued on metformin and she has achieved point-of-care A1c of 7.8% today.  She presents with some weight loss-see below.  She wears a CGM showing average blood glucose 130 mg per DL, with 03% time in range, 4% mild hyperglycemia.     she denies family history of thyroid  dysfunction nor any thyroid  cancer.  She is grieving the death of her brother by suicide.                           Review of systems  Limited as above  Objective:    BP 100/72   Pulse 84   Ht 5' 2 (1.575 m)   Wt 294 lb 3.2 oz (133.4 kg)   BMI 53.81 kg/m   Wt Readings from Last 3 Encounters:  10/25/23 294 lb 3.2 oz (133.4 kg)  10/23/23 297 lb (134.7 kg)  09/04/23 (!) 305 lb (138.3 kg)                   Component Value Date/Time   NA 134 07/07/2023 1542   K 4.5 07/07/2023 1542   CL 97 07/07/2023 1542   CO2 20 07/07/2023 1542   GLUCOSE 391 (H) 07/07/2023 1542   GLUCOSE 419 (H) 04/12/2023 1720   BUN 10 07/07/2023 1542   CREATININE 1.01 (H) 07/07/2023 1542   CALCIUM  9.8 07/07/2023 1542   PROT 6.8 07/07/2023 1542   ALBUMIN 4.0 07/07/2023 1542   AST 77 (H) 07/07/2023 1542   ALT 52 (H) 07/07/2023 1542   ALKPHOS 101 07/07/2023 1542   BILITOT 0.4 07/07/2023 1542   GFRNONAA >60 04/12/2023 1720   GFRAA 131 11/22/2019 0000     CBC    Component Value Date/Time   WBC 10.8 07/24/2023 1536   WBC 10.2 04/12/2023 1720   RBC 4.98 07/24/2023 1536   RBC 5.68 (H) 04/12/2023 1720   HGB 13.0 07/24/2023 1536   HCT 40.5 07/24/2023 1536   PLT 310 07/24/2023 1536   MCV 81 07/24/2023 1536   MCH 26.1 (L) 07/24/2023 1536   MCH 25.2 (L) 04/12/2023 1720   MCHC 32.1 07/24/2023 1536   MCHC 31.4 04/12/2023 1720   RDW 14.4 07/24/2023 1536   LYMPHSABS 2.6 07/24/2023 1536   EOSABS 0.3 07/24/2023 1536   BASOSABS 0.1 07/24/2023 1536     Diabetic Labs (most recent): Lab Results  Component Value Date   HGBA1C 7.8 (A) 10/25/2023   HGBA1C 11.4 07/19/2023   HGBA1C 7.6 (H) 11/09/2022    Lipid Panel     Component Value Date/Time   CHOL 116  10/24/2023 1155   TRIG 153 (H) 10/24/2023 1155   HDL 32 (L) 10/24/2023 1155   CHOLHDL 3.6 10/24/2023 1155   LDLCALC 58 10/24/2023 1155     Thyroid  uptake and scan on October 20,  20 FINDINGS: Homogeneous tracer accumulation within both thyroid  lobes, diffusely increased.   No focal areas of increased or decreased tracer localization.  4 hour I-123 uptake = 57% (normal 5-20%)  24 hour I-123 uptake = 68% (normal 10-30%)   IMPRESSION: Diffusely increased homogeneous tracer accumulation in both thyroid  lobes.   Increased 4 hour and 24 hour radio iodine uptakes consistent with hyperthyroidism.  Findings consistent with Graves disease.  I-131 thyroid  ablation on December 12, 2018  Her response was only partial. Repeat thyroid  uptake and scan on January 16, 2020 FINDINGS: Homogeneous tracer distribution in both thyroid  lobes.  No focal areas of increased or decreased tracer localization.   4 hour I-123 uptake = 27.7% (normal 5-20%),  24 hour I-123 uptake = 43.7% (normal 10-30%)   IMPRESSION: Normal thyroid  scan.  Mildly elevated 4 hour and 24 hour radio iodine uptakes.  Findings consistent with hyperthyroidism and Graves disease.   4 hour and 24 hour radio iodine uptakes have decreased since the previous exam.    Recent Results (from the past 2160 hours)  Hepatitis C Antibody     Status: None   Collection Time: 08/23/23  1:15 PM  Result Value Ref Range   Hep C Virus Ab Non Reactive Non Reactive    Comment: HCV antibody alone does not differentiate between previously resolved infection and active infection. Equivocal and Reactive HCV antibody results should be followed up with an HCV RNA test to support the diagnosis of active HCV infection.   Hepatitis B surface antigen     Status: None   Collection Time: 08/23/23  1:15 PM  Result Value Ref Range   Hepatitis B Surface Ag Negative Negative  IgG, IgA, IgM     Status: None   Collection Time: 08/23/23  1:15 PM  Result Value Ref Range   IgG (Immunoglobin G), Serum 1,165 586 - 1,602 mg/dL   IgA/Immunoglobulin A, Serum 282 87 - 352 mg/dL   IgM (Immunoglobulin M), Srm 159 26 - 217 mg/dL   Mitochondrial/smooth muscle ab pnl     Status: None   Collection Time: 08/23/23  1:15 PM  Result Value Ref Range   Smooth Muscle Ab 6 0 - 19 Units    Comment:                  Negative                     0 - 19                  Weak positive               20 - 30                  Moderate to strong positive     >30  Actin Antibodies are found in 52-85% of patients with  autoimmune hepatitis or chronic active hepatitis and  in 22% of patients with primary biliary cirrhosis.    Mitochondrial Ab <20.0 0.0 - 20.0 Units    Comment:                                 Negative  0.0 - 20.0                                 Equivocal  20.1 - 24.9                                 Positive         >24.9 Mitochondrial (M2) Antibodies are found in 90-96% of patients with primary biliary cirrhosis.   ANA Direct w/Reflex if Positive     Status: None   Collection Time: 08/23/23  1:15 PM  Result Value Ref Range   Anti Nuclear Antibody (ANA) Negative Negative  Ceruloplasmin     Status: None   Collection Time: 08/23/23  1:15 PM  Result Value Ref Range   Ceruloplasmin 34.1 19.0 - 39.0 mg/dL  Joeyj-[id number]-[id number] Deficiency     Status: None   Collection Time: 08/23/23  1:15 PM  Result Value Ref Range   AAT, DNA Analysis Comment     Comment: Result: c.1096 G>A (p.Glu366Lys), Z allele - Not detected c.863 A>T (p.Glu288Val), S allele - Not detected Not associated with increased risk of developing clinically relevant symptoms of alpha-1 antitrypsin deficiency. See Additional Clinical Information and Comments.    Additional Information: Comment     Comment: Additional Clinical Information: Alpha-1 antitrypsin deficiency is an autosomal recessive metabolic disorder with variable severity and age at onset. Signs and symptoms may include increased risk for chronic obstructive lung disease that typically manifests after age 71, liver disease, and liver cancer. Liver disease can be present in infancy as  neonatal cholestasis (jaundice) or in adulthood as cirrhosis and fibrosis. Lung and liver disease may be accelerated by environmental exposures such as smoking and excessive alcohol use. Established treatments for COPD and emphysema are used to treat lung disease; lung and/or liver transplantation may be an option for those with with severe disease. Intravenous augmentation therapy may be available for patients who meet criteria. Comments: The ZZ and SZ genotypes account for more than 95% of individuals with severe alpha-1 antitrypsin deficiency. To rule out other variants, further testing of symptomatic individuals heterozygous  for one variant (S or Z) or with negative results may include phenotyping (PI typing), AAT level testing, and/or expanded genotyping. Genetic counseling is recommended to discuss the potential clinical implications of positive results, as well as recommendations for testing family members. Genetic Coordinators are available for health care providers to discuss results at [phone]). Test Details: Two variants analyzed: c.1096 G>A (p.Glu366Lys), commonly referred to as the Z allele or PI*Z c.863 A>T (p.Glu288Val), commonly referred to as the S allele or PI*S Methods/Limitations: DNA analysis of the S and Z alleles in the SERPINA1 gene ([id number]) was performed by multiplex allele-specific PCR amplification followed by gel electrophoresis. Results must be combined with clinical information for the most accurate interpretation. Molecular-based testing is highly accurate, but as in any laboratory test, rare diagnostic errors may occur. False positive or fals e negative results may occur for reasons that include genetic variants, blood transfusions, bone marrow transplantation, somatic or tissue-specific mosaicism, mislabeled samples, or erroneous representation of family relationships. This test was developed and its performance characteristics  determined by [facility]. It has not been cleared or approved by the Food and Drug Administration. References: Sandhaus RA, Turino G, Brantly ML, Campos M, [address] [place], [place], Hog[place] DK, [address], Stocks JM, [place],  Strange C, Teckman J. The Diagnosis and Management of Alpha-1 Antitrypsin Deficiency in the Adult. Chronic Obstr Pulm Dis. 2016 Jun 6;3(3):668-682. doi: 10.15326/[id number]. PMID: 71151108; PMCID: EFR4443237. Stoller JK, Hupertz V, Aboussouan LS. Alpha-1 Antitrypsin Deficiency. 2006 Oct 27 [Updated 2020 May 21]. In: Juliene POSNER, Ardinger HH, Pagon RA, et al., editors. GeneReviews(R) [Internet]. [address] ([place]): [employer] [place] , [place]; 8006-7978. Availab le from: [email or web address].co.za    Electronically Signed by: Comment     Comment: Technical Component performed at [facility] RTP Professional Component performed by: Melissa A. Elberta, PhD, [facility] [id number], [facility], [address] [place] [address]   Enhanced Liver Fibrosis (ELF)     Status: None   Collection Time: 08/23/23  1:15 PM  Result Value Ref Range   ELF(TM) Score CANCELED     Comment: Test not performed. Due to technical problems in testing, a valid result could not be obtained. The remaining specimen was not sufficient for additional testing. ELF(TM) Score Interpretation: Risk cut-offs to assess the likelihood of progression to cirrhosis and liver-related clinical events within 3.9 years following baseline ELF score (IQR: 14.0-22.4 months)*:                           Lower risk             < 9.80                           Mid risk         9.80 - 11.29                           Higher risk            >11.29 Note: The ELF(TM) Score is a unitless numerical value. Ponce SA, Wong VW, [place] T, et al. Selonsertib for patients with bridging fibrosis or compensated cirrhosis due to NASH: Results from randomized phase III STELLAR trials. J Hepatol. 2020  Jul;73(1):26-39.  Result canceled by the ancillary.   Enhanced Liver Fibrosis (ELF)     Status: Abnormal   Collection Time: 09/13/23  2:36 PM  Result Value Ref Range   ELF(TM) Score 10.57 (H) <9.80    Comment: ELF(TM) Score Interpretation: Risk cut-offs to assess the likelihood of progression to cirrhosis and liver-related clinical events within 3.9 years following baseline ELF score (IQR: 14.0-22.4 months)*:                           Lower risk             < 9.80                           Mid risk         9.80 - 11.29                           Higher risk            >11.29 Note: The ELF(TM) Score is a unitless numerical value. Ponce SA, Wong VW, [place] T, et al. Selonsertib for patients with bridging fibrosis or compensated cirrhosis due to NASH: Results from randomized phase III STELLAR trials. J Hepatol. 2020 Jul;73(1):26-39.   TSH     Status: Abnormal   Collection Time: 10/24/23 11:55 AM  Result  Value Ref Range   TSH 0.056 (L) 0.450 - 4.500 uIU/mL  T4, free     Status: Abnormal   Collection Time: 10/24/23 11:55 AM  Result Value Ref Range   Free T4 2.09 (H) 0.82 - 1.77 ng/dL  Lipid panel     Status: Abnormal   Collection Time: 10/24/23 11:55 AM  Result Value Ref Range   Cholesterol, Total 116 100 - 199 mg/dL   Triglycerides 846 (H) 0 - 149 mg/dL   HDL 32 (L) >60 mg/dL   VLDL Cholesterol Cal 26 5 - 40 mg/dL   LDL Chol Calc (NIH) 58 0 - 99 mg/dL   Chol/HDL Ratio 3.6 0.0 - 4.4 ratio    Comment:                                   T. Chol/HDL Ratio                                             Men  Women                               1/2 Avg.Risk  3.4    3.3                                   Avg.Risk  5.0    4.4                                2X Avg.Risk  9.6    7.1                                3X Avg.Risk 23.4   11.0   HgB A1c     Status: Abnormal   Collection Time: 10/25/23  2:54 PM  Result Value Ref Range   Hemoglobin A1C     HbA1c POC (<> result, manual entry)      HbA1c, POC (prediabetic range)     HbA1c, POC (controlled diabetic range) 7.8 (A) 0.0 - 7.0 %   On February 21, 2020 she received second dose of RAI thyroid  ablative therapy: Patient then received the radiopharmaceutical without immediate complication.   RADIOPHARMACEUTICALS:  25.8 mCi I-131 sodium iodide orally   IMPRESSION: Per oral administration of I-131 sodium iodide for the treatment of recurrent hyperthyroidism.  Assessment & Plan:   1.  RAI induced hypothyroidism  2. Graves' disease-resolved 3.  Type 2 diabetes 4.  Vitamin D  deficiency 5.  Morbid obesity 6.  Hyperlipidemia  -She  has responded to the second treatment with RAI with clinical and biochemical hypothyroidism.    Her previsit labs are consistent with over replacement.  I discussed and lowered levothyroxine  to 200 mcg p.o. daily before breakfast.     - We discussed about the correct intake of her thyroid  hormone, on empty stomach at fasting, with water , separated by at least 30 minutes from breakfast and other medications,  and separated by more than 4 hours from calcium , iron, multivitamins, acid reflux medications (PPIs). -Patient is made aware of the fact  that thyroid  hormone replacement is needed for life, dose to be adjusted by periodic monitoring of thyroid  function tests.  Related to her metabolic syndrome involving morbid obesity, type 2 diabetes, MASLD, hyperlipidemia, she is encouraged to stay engaged with lifestyle medicine and her medications.  She he is benefiting from Mounjaro .  I discussed and increase Mounjaro  to 5 mg subcutaneously weekly.  This medication will be advanced as she tolerates.  She is also advised to continue metformin 1000 mg p.o. daily.  - She is encouraged to lower her Lantus  to 20 units nightly, utilizing her CGM at all times.  - she acknowledges that there is a room for improvement in her food and drink choices. - Suggestion is made for her to avoid simple carbohydrates  from  her diet including Cakes, Sweet Desserts, Ice Cream, Soda (diet and regular), Sweet Tea, Candies, Chips, Cookies, Store Bought Juices, Alcohol , Artificial Sweeteners,  Coffee Creamer, and Sugar-free Products, Lemonade. This will help patient to have more stable blood glucose profile and potentially avoid unintended weight gain.  The following Lifestyle Medicine recommendations according to American College of Lifestyle Medicine  [facility]) were discussed and and offered to patient and she  agrees to start the journey:  A. Whole Foods, Plant-Based Nutrition comprising of fruits and vegetables, plant-based proteins, whole-grain carbohydrates was discussed in detail with the patient.   A list for source of those nutrients were also provided to the patient.  Patient will use only water  or unsweetened tea for hydration. B.  The need to stay away from risky substances including alcohol, smoking; obtaining 7 to 9 hours of restorative sleep, at least 150 minutes of moderate intensity exercise weekly, the importance of healthy social connections,  and stress management techniques were discussed. C.  A full color page of  Calorie density of various food groups per pound showing examples of each food groups was provided to the patient.   She has severe dyslipidemia with MASLD, I advised her to stay close to her GI doctors.  I will proceed to increase her Crestor  to 20 mg p.o. nightly for next refill.  She   side effects and precautions discussed with her. She is advised to continue vitamin D3 5000 units daily.   - I advised her to maintain close follow up with Marchelle Clem Pitts, MD for primary care needs.   I spent  42  minutes in the care of the patient today including review of labs from CMP, Lipids, Thyroid  Function, Hematology (current and previous including abstractions from other facilities); face-to-face time discussing  her blood glucose readings/logs, discussing hypoglycemia and hyperglycemia  episodes and symptoms, medications doses, her options of short and long term treatment based on the latest standards of care / guidelines;  discussion about incorporating lifestyle medicine;  and documenting the encounter. Risk reduction counseling performed per USPSTF guidelines to reduce  obesity and cardiovascular risk factors.     Please refer to Patient Instructions for Blood Glucose Monitoring and Insulin /Medications Dosing Guide  in media tab for additional information. Please  also refer to  Patient Self Inventory in the Media  tab for reviewed elements of pertinent patient history.  Suraya Vidrine Siemon participated in the discussions, expressed understanding, and voiced agreement with the above plans.  All questions were answered to her satisfaction. she is encouraged to contact clinic should she have any questions or concerns prior to her return visit.   Follow up plan: Return in about 3 months (around 01/24/2024) for  F/U with Pre-visit Labs, Meter/CGM/Logs, A1c here.   Thank you for involving me in the care of this pleasant patient, and I will continue to update you with her progress.  Ranny Earl, MD [facility] Phone: [phone]  Fax: [phone]   10/25/2023, 5:25 PM  This note was partially dictated with voice recognition software. Similar sounding words can be transcribed inadequately or may not  be corrected upon review.

## 2023-10-25 NOTE — Patient Instructions (Signed)

## 2023-10-26 ENCOUNTER — Ambulatory Visit (INDEPENDENT_AMBULATORY_CARE_PROVIDER_SITE_OTHER): Admitting: Physical Medicine and Rehabilitation

## 2023-10-26 ENCOUNTER — Other Ambulatory Visit: Payer: Self-pay

## 2023-10-26 ENCOUNTER — Encounter: Payer: Self-pay | Admitting: Nutrition

## 2023-10-26 VITALS — BP 117/77 | HR 80

## 2023-10-26 DIAGNOSIS — M47816 Spondylosis without myelopathy or radiculopathy, lumbar region: Secondary | ICD-10-CM

## 2023-10-26 MED ORDER — BUPIVACAINE HCL 0.5 % IJ SOLN
3.0000 mL | Freq: Once | INTRAMUSCULAR | Status: AC
  Administered 2023-10-26: 3 mL

## 2023-10-26 NOTE — Progress Notes (Signed)
 Pain Scale   Average Pain 5 Patient advising she has lower back pain radiating to right leg advising walking increasing her pain and sitting lowers her pain.        +Driver, -BT, -Dye Allergies.

## 2023-10-27 ENCOUNTER — Telehealth: Payer: Self-pay | Admitting: Physical Medicine and Rehabilitation

## 2023-10-27 DIAGNOSIS — M47816 Spondylosis without myelopathy or radiculopathy, lumbar region: Secondary | ICD-10-CM

## 2023-10-27 NOTE — Telephone Encounter (Signed)
 Needs 2nd MBB, order put in

## 2023-11-01 NOTE — Progress Notes (Signed)
 Melissa Schmidt - 48 y.o. female MRN [id number]  Date of birth: February 11, 1975  Office Visit Note: Visit Date: 10/26/2023 PCP: Marchelle Clem Pitts, MD Referred by: Marchelle Clem Pitts, *  Subjective: Chief Complaint  Patient presents with   Lower Back - Pain   HPI:  Melissa Schmidt is a 48 y.o. female who comes in today at the request of Duwaine Pouch, FNP for planned Bilateral  L4-5 and L5-S1 Lumbar facet/medial branch block with fluoroscopic guidance.  The patient has failed conservative care including home exercise, medications, time and activity modification.  This injection will be diagnostic and hopefully therapeutic.  Please see requesting physician notes for further details and justification.  Exam has shown concordant pain with facet joint loading.   ROS Otherwise per HPI.  Assessment & Plan: Visit Diagnoses:    ICD-10-CM   1. Spondylosis without myelopathy or radiculopathy, lumbar region  M47.816 XR C-ARM NO REPORT    Nerve Block    bupivacaine  (MARCAINE ) 0.5 % (with pres) injection 3 mL      Plan: No additional findings.   Meds & Orders:  Meds ordered this encounter  Medications   bupivacaine  (MARCAINE ) 0.5 % (with pres) injection 3 mL    Orders Placed This Encounter  Procedures   Nerve Block   XR C-ARM NO REPORT    Follow-up: Return for Review Pain Diary.   Procedures: No procedures performed  Lumbar Diagnostic Facet Joint Nerve Block with Fluoroscopic Guidance   Patient: Melissa Schmidt      Date of Birth: [birth year] MRN: [id number] PCP: Marchelle Clem Pitts, MD      Visit Date: 10/26/2023   Universal Protocol:    Date/Time: 09/24/256:36 AM  Consent Given By: the patient  Position: PRONE  Additional Comments: Vital signs were monitored before and after the procedure. Patient was prepped and draped in the usual sterile fashion. The correct patient, procedure, and site was verified.   Injection Procedure Details:   Procedure diagnoses:  1. Spondylosis  without myelopathy or radiculopathy, lumbar region      Meds Administered:  Meds ordered this encounter  Medications   bupivacaine  (MARCAINE ) 0.5 % (with pres) injection 3 mL     Laterality: Bilateral  Location/Site: L4-L5, L3 and L4 medial branches and L5-S1, L4 medial branch and L5 dorsal ramus  Needle: 5.0 in., 25 ga.  Short bevel or Quincke spinal needle  Needle Placement: Oblique pedical  Findings:   -Comments: There was excellent flow of contrast along the articular pillars without intravascular flow.  Procedure Details: The fluoroscope beam is vertically oriented in AP and then obliqued 15 to 20 degrees to the ipsilateral side of the desired nerve to achieve the "Scotty dog" appearance.  The skin over the target area of the junction of the superior articulating process and the transverse process (sacral ala if blocking the L5 dorsal rami) was locally anesthetized with a 1 ml volume of 1% Lidocaine  without Epinephrine.  The spinal needle was inserted and advanced in a trajectory view down to the target.   After contact with periosteum and negative aspirate for blood and CSF, correct placement without intravascular or epidural spread was confirmed by injecting 0.5 ml. of Isovue-250.  A spot radiograph was obtained of this image.    Next, a 0.5 ml. volume of the injectate described above was injected. The needle was then redirected to the other facet joint nerves mentioned above if needed.  Prior to the procedure, the patient was given  a Pain Diary which was completed for baseline measurements.  After the procedure, the patient rated their pain every 30 minutes and will continue rating at this frequency for a total of 5 hours.  The patient has been asked to complete the Diary and return to us  by mail, fax or hand delivered as soon as possible.   Additional Comments:  No complications occurred Dressing: 2 x 2 sterile gauze and Band-Aid    Post-procedure details: Patient was  observed during the procedure. Post-procedure instructions were reviewed.  Patient left the clinic in stable condition.   Clinical History: CLINICAL DATA:  Low back pain, symptoms persist with > 6 wks treatment   EXAM: MRI LUMBAR SPINE WITHOUT CONTRAST   TECHNIQUE: Multiplanar, multisequence MR imaging of the lumbar spine was performed. No intravenous contrast was administered.   COMPARISON:  None Available.   FINDINGS: Segmentation:  Standard.   Alignment:  Physiologic.   Vertebrae:  No fracture, evidence of discitis, or bone lesion.   Conus medullaris and cauda equina: Conus extends to the L1-L2 disc space level. Conus and cauda equina appear normal.   Paraspinal and other soft tissues: Negative.   Disc levels:   T12-L1: Unremarkable   L1-L2: Unremarkable   L2-L3: Unremarkable   L3-L4: Mild bilateral facet degenerative change. No spinal canal or neural foraminal narrowing.   L4-L5: Moderate to severe bilateral facet degenerative change. No spinal canal narrowing. No neural foraminal narrowing.   L5-S1: Moderate to severe bilateral facet degenerative change. No spinal canal narrowing. No neural foraminal narrowing.   IMPRESSION: 1. Moderate to severe bilateral facet degenerative change at L4-L5 and L5-S1. 2. No spinal canal or neural foraminal narrowing.     Electronically Signed   By: [name] M.D.   On: 01/04/2023 17:18     Objective:  VS:  HT:    WT:   BMI:     BP:117/77  HR:80bpm  TEMP: ( )  RESP:  Physical Exam Vitals and nursing note reviewed.  Constitutional:      General: She is not in acute distress.    Appearance: Normal appearance. She is not ill-appearing.  HENT:     Head: Normocephalic and atraumatic.     Right Ear: External ear normal.     Left Ear: External ear normal.  Eyes:     Extraocular Movements: Extraocular movements intact.  Cardiovascular:     Rate and Rhythm: Normal rate.     Pulses: Normal pulses.   Pulmonary:     Effort: Pulmonary effort is normal. No respiratory distress.  Abdominal:     General: There is no distension.     Palpations: Abdomen is soft.  Musculoskeletal:        General: Tenderness present.     Cervical back: Neck supple.     Right lower leg: No edema.     Left lower leg: No edema.     Comments: Patient has good distal strength with no pain over the greater trochanters.  No clonus or focal weakness.  Skin:    Findings: No erythema, lesion or rash.  Neurological:     General: No focal deficit present.     Mental Status: She is alert and oriented to person, place, and time.     Sensory: No sensory deficit.     Motor: No weakness or abnormal muscle tone.     Coordination: Coordination normal.  Psychiatric:        Mood and Affect: Mood normal.  Behavior: Behavior normal.      Imaging: No results found.

## 2023-11-01 NOTE — Procedures (Signed)
 Lumbar Diagnostic Facet Joint Nerve Block with Fluoroscopic Guidance   Patient: Melissa Schmidt      Date of Birth: 1975-09-22 MRN: [id number] PCP: Marchelle Clem Pitts, MD      Visit Date: 10/26/2023   Universal Protocol:    Date/Time: 09/24/256:36 AM  Consent Given By: the patient  Position: PRONE  Additional Comments: Vital signs were monitored before and after the procedure. Patient was prepped and draped in the usual sterile fashion. The correct patient, procedure, and site was verified.   Injection Procedure Details:   Procedure diagnoses:  1. Spondylosis without myelopathy or radiculopathy, lumbar region      Meds Administered:  Meds ordered this encounter  Medications   bupivacaine  (MARCAINE ) 0.5 % (with pres) injection 3 mL     Laterality: Bilateral  Location/Site: L4-L5, L3 and L4 medial branches and L5-S1, L4 medial branch and L5 dorsal ramus  Needle: 5.0 in., 25 ga.  Short bevel or Quincke spinal needle  Needle Placement: Oblique pedical  Findings:   -Comments: There was excellent flow of contrast along the articular pillars without intravascular flow.  Procedure Details: The fluoroscope beam is vertically oriented in AP and then obliqued 15 to 20 degrees to the ipsilateral side of the desired nerve to achieve the "Scotty dog" appearance.  The skin over the target area of the junction of the superior articulating process and the transverse process (sacral ala if blocking the L5 dorsal rami) was locally anesthetized with a 1 ml volume of 1% Lidocaine  without Epinephrine.  The spinal needle was inserted and advanced in a trajectory view down to the target.   After contact with periosteum and negative aspirate for blood and CSF, correct placement without intravascular or epidural spread was confirmed by injecting 0.5 ml. of Isovue-250.  A spot radiograph was obtained of this image.    Next, a 0.5 ml. volume of the injectate described above was injected. The  needle was then redirected to the other facet joint nerves mentioned above if needed.  Prior to the procedure, the patient was given a Pain Diary which was completed for baseline measurements.  After the procedure, the patient rated their pain every 30 minutes and will continue rating at this frequency for a total of 5 hours.  The patient has been asked to complete the Diary and return to us  by mail, fax or hand delivered as soon as possible.   Additional Comments:  No complications occurred Dressing: 2 x 2 sterile gauze and Band-Aid    Post-procedure details: Patient was observed during the procedure. Post-procedure instructions were reviewed.  Patient left the clinic in stable condition.

## 2023-11-08 ENCOUNTER — Encounter (HOSPITAL_COMMUNITY): Payer: Self-pay | Admitting: Radiology

## 2023-11-09 ENCOUNTER — Encounter: Payer: Self-pay | Admitting: Gastroenterology

## 2023-11-09 ENCOUNTER — Ambulatory Visit: Admitting: Gastroenterology

## 2023-11-09 VITALS — BP 106/73 | HR 86 | Temp 98.1°F | Ht 63.0 in | Wt 288.4 lb

## 2023-11-09 DIAGNOSIS — K641 Second degree hemorrhoids: Secondary | ICD-10-CM | POA: Diagnosis not present

## 2023-11-09 DIAGNOSIS — R1010 Upper abdominal pain, unspecified: Secondary | ICD-10-CM

## 2023-11-09 NOTE — Progress Notes (Signed)
    [facility] BANDING PROCEDURE NOTE  Melissa Schmidt is a 48 y.o. female presenting today for consideration of hemorrhoid banding. Last colonoscopy  May 2025: non-bleeding external and internal hemorrhoids, otherwise normal. She has had left lateral banding. Notes today she is having issues with epigastric and RUQ abdominal pain for the past 6 weeks. No nausea. Notes postprandial abdominal pain. No fever or chills. No NSAIDs. PPI daily. TTP upper abdomen and RUQ. Gallbladder absent. Will band today and further labs as below.    The patient presents with symptomatic grade 2 hemorrhoids, unresponsive to maximal medical therapy, requesting rubber band ligation of her hemorrhoidal disease. All risks, benefits, and alternative forms of therapy were described and informed consent was obtained.   The decision was made to band the right posterior internal hemorrhoid, and the [facility] O'Regan System was used to perform band ligation without complication. Digital anorectal examination was then performed to assure proper positioning of the band, and to adjust the banded tissue as required. The patient was discharged home without pain or other issues. Dietary and behavioral recommendations were given, along with follow-up instructions. The patient will return in several weeks for followup and possible additional banding as required.  No complications were encountered and the patient tolerated the procedure well.   In addition: history of MASH and celiac disease. As she has had abdominal pain new onset in past 6 weeks, will update celiac serology in case inadvertently ingesting gluten. I am also checking CBC, CMP, lipase. Gallbladder absent but unable to rule out microlithiasis as sounds biliary in etiology. After labs reviewed, will determine if we need CT or EGD. I suspect may need EGD regardless in the future. Hold off on liver biopsy on 10/7 for now.   Melissa [name], PhD, ANP-BC [facility]

## 2023-11-09 NOTE — Patient Instructions (Signed)
 Please have blood work done. I will message you with the results and if we need to do a CT or an upper endoscopy!  I will see you back in 2 weeks for banding.  We will cancel the liver biopsy for now until this abdominal pain is addressed.   I enjoyed seeing you again today! I value our relationship and want to provide genuine, compassionate, and quality care. You may receive a survey regarding your visit with me, and I welcome your feedback! Thanks so much for taking the time to complete this. I look forward to seeing you again.      Therisa [name], PhD, ANP-BC [facility]  t

## 2023-11-09 NOTE — H&P (View-Only) (Signed)
    [facility] BANDING PROCEDURE NOTE  Melissa Schmidt is a 48 y.o. female presenting today for consideration of hemorrhoid banding. Last colonoscopy  May 2025: non-bleeding external and internal hemorrhoids, otherwise normal. She has had left lateral banding. Notes today she is having issues with epigastric and RUQ abdominal pain for the past 6 weeks. No nausea. Notes postprandial abdominal pain. No fever or chills. No NSAIDs. PPI daily. TTP upper abdomen and RUQ. Gallbladder absent. Will band today and further labs as below.    The patient presents with symptomatic grade 2 hemorrhoids, unresponsive to maximal medical therapy, requesting rubber band ligation of her hemorrhoidal disease. All risks, benefits, and alternative forms of therapy were described and informed consent was obtained.   The decision was made to band the right posterior internal hemorrhoid, and the [facility] O'Regan System was used to perform band ligation without complication. Digital anorectal examination was then performed to assure proper positioning of the band, and to adjust the banded tissue as required. The patient was discharged home without pain or other issues. Dietary and behavioral recommendations were given, along with follow-up instructions. The patient will return in several weeks for followup and possible additional banding as required.  No complications were encountered and the patient tolerated the procedure well.   In addition: history of MASH and celiac disease. As she has had abdominal pain new onset in past 6 weeks, will update celiac serology in case inadvertently ingesting gluten. I am also checking CBC, CMP, lipase. Gallbladder absent but unable to rule out microlithiasis as sounds biliary in etiology. After labs reviewed, will determine if we need CT or EGD. I suspect may need EGD regardless in the future. Hold off on liver biopsy on 10/7 for now.   Melissa [name], PhD, ANP-BC [facility]

## 2023-11-11 LAB — COMPREHENSIVE METABOLIC PANEL WITH GFR
ALT: 23 IU/L (ref 0–32)
AST: 24 IU/L (ref 0–40)
Albumin: 4 g/dL (ref 3.9–4.9)
Alkaline Phosphatase: 80 IU/L (ref 41–116)
BUN/Creatinine Ratio: 15 (ref 9–23)
BUN: 11 mg/dL (ref 6–24)
Bilirubin Total: 0.4 mg/dL (ref 0.0–1.2)
CO2: 23 mmol/L (ref 20–29)
Calcium: 9.4 mg/dL (ref 8.7–10.2)
Chloride: 101 mmol/L (ref 96–106)
Creatinine, Ser: 0.73 mg/dL (ref 0.57–1.00)
Globulin, Total: 2.7 g/dL (ref 1.5–4.5)
Glucose: 132 mg/dL — ABNORMAL HIGH (ref 70–99)
Potassium: 4.4 mmol/L (ref 3.5–5.2)
Sodium: 138 mmol/L (ref 134–144)
Total Protein: 6.7 g/dL (ref 6.0–8.5)
eGFR: 103 mL/min/1.73 (ref >59)

## 2023-11-11 LAB — CBC WITH DIFFERENTIAL/PLATELET
Basophils Absolute: 0.1 x10E3/uL (ref 0.0–0.2)
Basos: 1 %
EOS (ABSOLUTE): 0.2 x10E3/uL (ref 0.0–0.4)
Eos: 2 %
Hematocrit: 39.2 % (ref 34.0–46.6)
Hemoglobin: 12.5 g/dL (ref 11.1–15.9)
Immature Grans (Abs): 0 x10E3/uL (ref 0.0–0.1)
Immature Granulocytes: 0 %
Lymphocytes Absolute: 2.7 x10E3/uL (ref 0.7–3.1)
Lymphs: 22 %
MCH: 25.6 pg — ABNORMAL LOW (ref 26.6–33.0)
MCHC: 31.9 g/dL (ref 31.5–35.7)
MCV: 80 fL (ref 79–97)
Monocytes Absolute: 0.7 x10E3/uL (ref 0.1–0.9)
Monocytes: 6 %
Neutrophils Absolute: 8.6 x10E3/uL — ABNORMAL HIGH (ref 1.4–7.0)
Neutrophils: 69 %
Platelets: 386 x10E3/uL (ref 150–450)
RBC: 4.88 x10E6/uL (ref 3.77–5.28)
RDW: 13.9 % (ref 11.7–15.4)
WBC: 12.2 x10E3/uL — ABNORMAL HIGH (ref 3.4–10.8)

## 2023-11-11 LAB — TISSUE TRANSGLUTAMINASE, IGA: Transglutaminase IgA: 5 U/mL — ABNORMAL HIGH (ref 0–3)

## 2023-11-11 LAB — LIPASE: Lipase: 29 U/L (ref 14–72)

## 2023-11-13 ENCOUNTER — Ambulatory Visit: Payer: Self-pay | Admitting: Gastroenterology

## 2023-11-13 ENCOUNTER — Other Ambulatory Visit: Payer: Self-pay | Admitting: Student

## 2023-11-13 ENCOUNTER — Telehealth: Payer: Self-pay

## 2023-11-13 DIAGNOSIS — R1319 Other dysphagia: Secondary | ICD-10-CM

## 2023-11-13 DIAGNOSIS — K219 Gastro-esophageal reflux disease without esophagitis: Secondary | ICD-10-CM

## 2023-11-13 DIAGNOSIS — K760 Fatty (change of) liver, not elsewhere classified: Secondary | ICD-10-CM

## 2023-11-13 NOTE — H&P (Incomplete)
 Chief Complaint: Patient was seen in consultation today for No chief complaint on file.  at the request of Shirlean Therisa ORN  Referring Physician(s): Shirlean Therisa ORN  Supervising Physician: Philip Cornet  Patient Status: [facility] - Out-pt  History of Present Illness: Melissa Schmidt is a 48 y.o. female  outpatient. History of DM, HLD.  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  Currently without any significant complaints. Patient alert and laying in bed,calm. Denies any fevers, headache, chest pain, SOB, cough, abdominal pain, nausea, vomiting or bleeding.   US  abdomen from 9.6.25 reads  Morphologic changes of the liver consistent with underlying hepatic steatosis. There may be early morphologic changes of cirrhosis. This could be assessed with elastography if clinically indicated. Labs pending. All medications are within acceptable parameters. No pertinent allergies. Patient has been NPO since midnight.    Return precautions and treatment recommendations and follow-up discussed with the patient *** who is agreeable with the plan.   Past Medical History:  Diagnosis Date   Arthritis    Borderline diabetes    Celiac disease    Complication of anesthesia    shaking post anesthesia   Diabetes mellitus without complication (HCC)    Dyspnea    Hemorrhoids    Hyperthyroidism    Hypertriglyceridemia    PCOS (polycystic ovarian syndrome)     Past Surgical History:  Procedure Laterality Date   APPENDECTOMY  2002   CHOLECYSTECTOMY  11/2022   COLONOSCOPY N/A 06/14/2023   Procedure: COLONOSCOPY;  Surgeon: Shaaron Lamar HERO, MD;  Location: [facility];  Service: Endoscopy;  Laterality: N/A;  815am, asa 3   COLONOSCOPY WITH PROPOFOL  N/A 05/25/2015   Dr.Rourk- non-bleeding hemorrhoids, the examination was o/w normal   ESOPHAGEAL DILATION N/A 05/25/2015   Procedure: ESOPHAGEAL DILATION;  Surgeon: Lamar HERO Shaaron, MD;  Location: [facility];  Service:  Endoscopy;  Laterality: N/A;   ESOPHAGOGASTRODUODENOSCOPY  04/2017   [place]: EGD with irregular Z-line s/p biopsy, benign-appearing esophageal stenosis s/p dilation, normal examined duodenum but no biopsies of duodenum. Pathology with junctional type GE mucosa with chronic inflammation and surface erosion, no metaplasia, negative dysplasia.    ESOPHAGOGASTRODUODENOSCOPY (EGD) WITH PROPOFOL  N/A 05/25/2015   Dr.Rourk- normal esophagus, dilated, gastric erosions, intact fundoplication, normal third portion of the duodenum, non-bleeding erosive gastropathy. duodenum bx= benign small bowel mucosa with patchy increased intraepithelial lymphocytes, mild villous blunting and mild crypt hyperplasia, no dysplasia or malignancy stomach bx= erosive gastritis with reactive changes   LIVER BIOPSY N/A 11/11/2022   Procedure: LIVER BIOPSY;  Surgeon: Kallie Manuelita BROCKS, MD;  Location: [facility];  Service: General;  Laterality: N/A;   NISSEN FUNDOPLICATION  2005    Allergies: Montelukast sodium, Doxycycline, and Singulair [montelukast]  Medications: Prior to Admission medications   Medication Sig Start Date End Date Taking? Authorizing Provider  albuterol  (PROAIR  HFA) 108 (90 Base) MCG/ACT inhaler 2 puffs every 4 hours as needed only  if your can't catch your breath 05/02/23   Darlean Ozell NOVAK, MD  Ascorbic Acid (VITAMIN C ADULT GUMMIES PO) Take 2 tablets by mouth daily.    [provider]  Blood Glucose Monitoring Suppl (ACCU-CHEK GUIDE ME) w/Device KIT Use to check blood glucose four times daily as directed. 07/26/23   Nida, Gebreselassie W, MD  budeson-glycopyrrolate -formoterol  (BREZTRI  AEROSPHERE) 160-9-4.8 MCG/ACT AERO Inhale 2 puffs into the lungs in the morning and at bedtime. 05/02/23   Wert, Michael B, MD  budesonide -formoterol  (SYMBICORT ) 80-4.5 MCG/ACT inhaler  Take 2 puffs first thing in am and then another 2 puffs about 12 hours later. 05/02/23   Darlean Ozell NOVAK, MD  Calcium  Carb-Cholecalciferol  (CALCIUM  1000 + D PO) Take 1 tablet by mouth daily.    [provider]  Cholecalciferol (VITAMIN D3) 125 MCG (5000 UT) CAPS Take 1 capsule (5,000 Units total) by mouth daily. 12/03/20   Nida, Gebreselassie W, MD  Continuous Glucose Receiver (FREESTYLE LIBRE 3 READER) DEVI 1 Piece by Does not apply route once as needed for up to 1 dose. 07/25/23   Nida, Gebreselassie W, MD  Continuous Glucose Sensor (FREESTYLE LIBRE 3 PLUS SENSOR) MISC Change sensor every 15 days. 07/25/23   Nida, Gebreselassie W, MD  diazepam  (VALIUM ) 5 MG tablet Take one tablet by mouth with light food one hour prior to procedure. 09/14/23   Williams, Megan E, NP  famotidine  (PEPCID ) 20 MG tablet One after supper 05/02/23   Wert, Michael B, MD  ferrous sulfate 325 (65 FE) MG EC tablet Take 325 mg by mouth daily with breakfast.    [provider]  fluticasone (FLONASE) 50 MCG/ACT nasal spray Place 2 sprays into both nostrils daily as needed for allergies.    [provider]  glucose blood (ACCU-CHEK GUIDE TEST) test strip Use to monitor glucose 4 times daily as instructed 07/25/23   Nida, Gebreselassie W, MD  hydrocortisone  (ANUSOL -HC) 2.5 % rectal cream Place 1 application rectally 2 (two) times daily. 12/11/18   Shirlean Therisa ORN, NP  insulin  glargine (LANTUS  SOLOSTAR) 100 UNIT/ML Solostar Pen Inject 20 Units into the skin at bedtime. 10/25/23   Nida, Gebreselassie W, MD  levothyroxine  (SYNTHROID ) 200 MCG tablet TAKE 1 TABLET BY MOUTH ONCE DAILY BEFORE BREAKFAST 09/26/23   Nida, Gebreselassie W, MD  losartan (COZAAR) 50 MG tablet Take 1 tablet by mouth daily. 04/19/23   [provider]  lurasidone (LATUDA) 80 MG TABS tablet Take 80 mg by mouth daily with breakfast.    [provider]  metFORMIN (GLUCOPHAGE) 1000 MG tablet Take 1,000 mg by mouth 2 (two) times daily with a meal.     [provider]  methocarbamol  (ROBAXIN ) 500 MG tablet Take 1 tablet (500 mg total) by mouth 3 (three) times  daily. 10/13/22   Williams, Megan E, NP  Multiple Vitamin (MULTIVITAMIN) capsule Take 1 capsule by mouth daily.    [provider]  pantoprazole  (PROTONIX ) 40 MG tablet TAKE 1 TABLET BY MOUTH ONCE DAILY BEFORE BREAKFAST 09/07/23   Shirlean Therisa ORN, NP  PARoxetine (PAXIL-CR) 37.5 MG 24 hr tablet Take 37.5 mg by mouth daily. 05/20/23   [provider]  propranolol  (INDERAL ) 20 MG tablet Take 10 mg by mouth every 6 (six) hours as needed (anxiety). 09/28/22   [provider]  rosuvastatin  (CRESTOR ) 10 MG tablet Take 10 mg by mouth at bedtime.    [provider]  tirzepatide  (MOUNJARO ) 5 MG/0.5ML Pen Inject 5 mg into the skin once a week. 10/25/23   Nida, Gebreselassie W, MD  topiramate (TOPAMAX) 25 MG tablet Take 100 mg by mouth daily.    [provider]  valACYclovir (VALTREX) 1000 MG tablet Take 1,000 mg by mouth. 10/12/23   [provider]  Vitamin D , Ergocalciferol , (DRISDOL ) 1.25 MG (50000 UNIT) CAPS capsule Take 1 capsule (50,000 Units total) by mouth every 7 (seven) days. 07/20/23   Shirlean Therisa ORN, NP     Family History  Problem Relation Age of Onset   Irritable bowel syndrome Mother  Colon polyps Mother        ? 5 year surveillance   Inflammatory bowel disease Neg Hx    Colon cancer Neg Hx    Liver disease Neg Hx     Social History   Socioeconomic History   Marital status: Married    Spouse name: Not on file   Number of children: 1   Years of education: Not on file   Highest education level: Not on file  Occupational History   Occupation: in school  Tobacco Use   Smoking status: Never   Smokeless tobacco: Never  Vaping Use   Vaping status: Never Used  Substance and Sexual Activity   Alcohol use: No    Alcohol/week: 0.0 standard drinks of alcohol   Drug use: No   Sexual activity: Yes    Birth control/protection: None  Other Topics Concern   Not on file  Social History Narrative   Not on file   Social Drivers of Health    Financial Resource Strain: Not on file  Food Insecurity: Not on file  Transportation Needs: Not on file  Physical Activity: Not on file  Stress: Not on file  Social Connections: Not on file    ECOG Status: {CHL ONC ECOG ED:[id number]}  Review of Systems: A 12 point ROS discussed and pertinent positives are indicated in the HPI above.  All other systems are negative.  Review of Systems  Vital Signs: LMP  (LMP Unknown)     Physical Exam  Imaging: XR C-ARM NO REPORT Result Date: 10/26/2023 Please see Notes tab for imaging impression.   Labs:  CBC: Recent Labs    04/12/23 1720 07/05/23 1642 07/24/23 1536 11/09/23 1617  WBC 10.2 10.0 10.8 12.2*  HGB 14.3 13.8 13.0 12.5  HCT 45.5 44.3 40.5 39.2  PLT 331 297 310 386    COAGS: No results for input(s): INR, APTT in the last 8760 hours.  BMP: Recent Labs    04/12/23 1720 07/05/23 1642 07/07/23 1542 11/09/23 1617  NA 131* 131* 134 138  K 3.8 4.5 4.5 4.4  CL 97* 95* 97 101  CO2 21* 19* 20 23  GLUCOSE 419* 368* 391* 132*  BUN 15 10 10 11   CALCIUM  9.5 9.7 9.8 9.4  CREATININE 0.89 0.83 1.01* 0.73  GFRNONAA >60  --   --   --     LIVER FUNCTION TESTS: Recent Labs    04/12/23 1720 07/05/23 1642 07/07/23 1542 11/09/23 1617  BILITOT 0.4 0.3 0.4 0.4  AST 67* 77* 77* 24  ALT 55* 53* 52* 23  ALKPHOS 86 102 101 80  PROT 8.0 6.9 6.8 6.7  ALBUMIN 3.6 4.1 4.0 4.0    TUMOR MARKERS: No results for input(s): AFPTM, CEA, CA199, CHROMGRNA in the last 8760 hours.  Assessment and Plan:  48 y.o. Female outpatient. History of DM, HLD.  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  All of the questions  were answered and there is agreement to proceed.  Consent signed and in chart.    Thank you for this interesting consult.  I greatly enjoyed meeting SHAYLEY MEDLIN and look forward to participating in their care.  A copy of this report was  sent to the requesting provider on this date.  Electronically Signed: Delon JAYSON Beagle, NP 11/13/2023, 10:53 PM   I spent a total of {New PWEU:[id number]} {New Out-Pt:[id number]}  {Established Out-Pt:[id number]} in face to face in clinical consultation, greater than 50% of which was counseling/coordinating care for ***

## 2023-11-14 ENCOUNTER — Ambulatory Visit (HOSPITAL_COMMUNITY): Admission: RE | Admit: 2023-11-14 | Source: Ambulatory Visit

## 2023-11-14 ENCOUNTER — Other Ambulatory Visit: Payer: Self-pay | Admitting: *Deleted

## 2023-11-14 DIAGNOSIS — K900 Celiac disease: Secondary | ICD-10-CM

## 2023-11-14 DIAGNOSIS — R1010 Upper abdominal pain, unspecified: Secondary | ICD-10-CM

## 2023-11-14 MED ORDER — PANTOPRAZOLE SODIUM 40 MG PO TBEC: 40.0000 mg | DELAYED_RELEASE_TABLET | Freq: Two times a day (BID) | ORAL | 3 refills | Status: AC

## 2023-11-14 NOTE — Telephone Encounter (Signed)
 error

## 2023-11-14 NOTE — Telephone Encounter (Signed)
 Please arrange EGD due to abdominal pain, hx of celiac disease with Dr. Shaaron. ASA 3.   1/2 dose of Lantus  evening before, no metformin day of procedure, hold Mounjaro  one week prior,

## 2023-11-14 NOTE — Addendum Note (Signed)
 Addended by: SHIRLEAN THERISA ORN on: 11/14/2023 01:06 PM   Modules accepted: Orders

## 2023-11-22 ENCOUNTER — Encounter (INDEPENDENT_AMBULATORY_CARE_PROVIDER_SITE_OTHER): Payer: Self-pay | Admitting: Otolaryngology

## 2023-11-22 ENCOUNTER — Ambulatory Visit (INDEPENDENT_AMBULATORY_CARE_PROVIDER_SITE_OTHER): Admitting: Otolaryngology

## 2023-11-22 VITALS — BP 110/76 | HR 84 | Temp 97.6°F | Ht 63.0 in | Wt 290.0 lb

## 2023-11-22 DIAGNOSIS — H6121 Impacted cerumen, right ear: Secondary | ICD-10-CM | POA: Diagnosis not present

## 2023-11-22 DIAGNOSIS — H9311 Tinnitus, right ear: Secondary | ICD-10-CM

## 2023-11-22 DIAGNOSIS — J342 Deviated nasal septum: Secondary | ICD-10-CM | POA: Diagnosis not present

## 2023-11-22 DIAGNOSIS — J309 Allergic rhinitis, unspecified: Secondary | ICD-10-CM

## 2023-11-22 DIAGNOSIS — J343 Hypertrophy of nasal turbinates: Secondary | ICD-10-CM

## 2023-11-22 DIAGNOSIS — H9011 Conductive hearing loss, unilateral, right ear, with unrestricted hearing on the contralateral side: Secondary | ICD-10-CM

## 2023-11-22 DIAGNOSIS — J329 Chronic sinusitis, unspecified: Secondary | ICD-10-CM

## 2023-11-22 DIAGNOSIS — J324 Chronic pansinusitis: Secondary | ICD-10-CM

## 2023-11-22 NOTE — Progress Notes (Unsigned)
 CC: Chronic nasal obstruction, recurrent sinusitis, right ear tinnitus  Discussed the use of AI scribe software for clinical note transcription with the patient, who gave verbal consent to proceed.  History of Present Illness Melissa Schmidt is a 48 year old female with a history of nasal septal deviation who presents today with recurrent sinus infections, nasal congestion, and right ear tinnitus.  She experiences recurrent sinus infections, particularly when her allergies are triggered or when she catches a cold. Symptoms during these infections include severe sinus pressure, facial pain, nasal drainage, and congestion. Over the past year, she has been prescribed antibiotics approximately five to six times for these infections.  She has a deviated septum and experiences difficulty breathing through her nose. A CT scan of her head performed three to four years ago confirmed the presence of a deviated septum. She has not undergone any surgery for her ear, nose, or throat and has not seen an ENT specialist for her deviated septum before this visit.  She was diagnosed with sleep apnea recently and uses a CPAP machine. She is unsure if her deviated septum is related to her sleep apnea. For her allergies, she uses Flonase nasal spray daily and takes Zyrtec.  She also experiences ringing in her right ear, which comes and goes, and has been feeling dizzy for the past three to four days. Her right ear sometimes swells and has been bothering her for about a week and a half.   Past Medical History:  Diagnosis Date   Arthritis    Borderline diabetes    Celiac disease    Complication of anesthesia    shaking post anesthesia   Diabetes mellitus without complication (HCC)    Dyspnea    Hemorrhoids    Hyperthyroidism    Hypertriglyceridemia    PCOS (polycystic ovarian syndrome)     Past Surgical History:  Procedure Laterality Date   APPENDECTOMY  2002   CHOLECYSTECTOMY  11/2022   COLONOSCOPY N/A  06/14/2023   Procedure: COLONOSCOPY;  Surgeon: Shaaron Lamar HERO, MD;  Location: [facility];  Service: Endoscopy;  Laterality: N/A;  815am, asa 3   COLONOSCOPY WITH PROPOFOL  N/A 05/25/2015   Dr.Rourk- non-bleeding hemorrhoids, the examination was o/w normal   ESOPHAGEAL DILATION N/A 05/25/2015   Procedure: ESOPHAGEAL DILATION;  Surgeon: Lamar HERO Shaaron, MD;  Location: [facility];  Service: Endoscopy;  Laterality: N/A;   ESOPHAGOGASTRODUODENOSCOPY  04/2017   [place]: EGD with irregular Z-line s/p biopsy, benign-appearing esophageal stenosis s/p dilation, normal examined duodenum but no biopsies of duodenum. Pathology with junctional type GE mucosa with chronic inflammation and surface erosion, no metaplasia, negative dysplasia.    ESOPHAGOGASTRODUODENOSCOPY (EGD) WITH PROPOFOL  N/A 05/25/2015   Dr.Rourk- normal esophagus, dilated, gastric erosions, intact fundoplication, normal third portion of the duodenum, non-bleeding erosive gastropathy. duodenum bx= benign small bowel mucosa with patchy increased intraepithelial lymphocytes, mild villous blunting and mild crypt hyperplasia, no dysplasia or malignancy stomach bx= erosive gastritis with reactive changes   LIVER BIOPSY N/A 11/11/2022   Procedure: LIVER BIOPSY;  Surgeon: Kallie Manuelita BROCKS, MD;  Location: [facility];  Service: General;  Laterality: N/A;   NISSEN FUNDOPLICATION  2005    Family History  Problem Relation Age of Onset   Irritable bowel syndrome Mother    Colon polyps Mother        ? 5 year surveillance   Inflammatory bowel disease Neg Hx    Colon cancer Neg Hx    Liver disease Neg Hx  Social History:  reports that she has never smoked. She has never used smokeless tobacco. She reports that she does not drink alcohol and does not use drugs.  Allergies:  Allergies  Allergen Reactions   Montelukast Sodium Dermatitis    montelukast sodium   Doxycycline Rash   Singulair [Montelukast] Rash    Prior to Admission  medications   Medication Sig Start Date End Date Taking? Authorizing Provider  albuterol  (PROAIR  HFA) 108 (90 Base) MCG/ACT inhaler 2 puffs every 4 hours as needed only  if your can't catch your breath 05/02/23  Yes Darlean Ozell NOVAK, MD  Ascorbic Acid (VITAMIN C ADULT GUMMIES PO) Take 2 tablets by mouth daily.   Yes [provider]  Blood Glucose Monitoring Suppl (ACCU-CHEK GUIDE ME) w/Device KIT Use to check blood glucose four times daily as directed. 07/26/23  Yes Nida, Ethelle ORN, MD  budeson-glycopyrrolate -formoterol  (BREZTRI  AEROSPHERE) 160-9-4.8 MCG/ACT AERO Inhale 2 puffs into the lungs in the morning and at bedtime. 05/02/23  Yes Darlean Ozell NOVAK, MD  budesonide -formoterol  (SYMBICORT ) 80-4.5 MCG/ACT inhaler Take 2 puffs first thing in am and then another 2 puffs about 12 hours later. 05/02/23  Yes Darlean Ozell NOVAK, MD  Calcium  Carb-Cholecalciferol (CALCIUM  1000 + D PO) Take 1 tablet by mouth daily.   Yes [provider]  Cholecalciferol (VITAMIN D3) 125 MCG (5000 UT) CAPS Take 1 capsule (5,000 Units total) by mouth daily. 12/03/20  Yes Nida, Ethelle ORN, MD  Continuous Glucose Receiver (FREESTYLE LIBRE 3 READER) DEVI 1 Piece by Does not apply route once as needed for up to 1 dose. 07/25/23  Yes Nida, Gebreselassie W, MD  Continuous Glucose Sensor (FREESTYLE LIBRE 3 PLUS SENSOR) MISC Change sensor every 15 days. 07/25/23  Yes Nida, Ethelle ORN, MD  diazepam  (VALIUM ) 5 MG tablet Take one tablet by mouth with light food one hour prior to procedure. 09/14/23  Yes Williams, Megan E, NP  famotidine  (PEPCID ) 20 MG tablet One after supper 05/02/23  Yes Wert, Michael B, MD  ferrous sulfate 325 (65 FE) MG EC tablet Take 325 mg by mouth daily with breakfast.   Yes [provider]  fluticasone (FLONASE) 50 MCG/ACT nasal spray Place 2 sprays into both nostrils daily as needed for allergies.   Yes [provider]  glucose blood (ACCU-CHEK GUIDE TEST) test strip Use to  monitor glucose 4 times daily as instructed 07/25/23  Yes Nida, Gebreselassie W, MD  hydrocortisone  (ANUSOL -HC) 2.5 % rectal cream Place 1 application rectally 2 (two) times daily. 12/11/18  Yes Shirlean Therisa ORN, NP  insulin  glargine (LANTUS  SOLOSTAR) 100 UNIT/ML Solostar Pen Inject 20 Units into the skin at bedtime. 10/25/23  Yes Nida, Ethelle ORN, MD  levothyroxine  (SYNTHROID ) 200 MCG tablet TAKE 1 TABLET BY MOUTH ONCE DAILY BEFORE BREAKFAST 09/26/23  Yes Nida, Gebreselassie W, MD  losartan (COZAAR) 50 MG tablet Take 1 tablet by mouth daily. 04/19/23  Yes [provider]  lurasidone (LATUDA) 80 MG TABS tablet Take 80 mg by mouth daily with breakfast.   Yes [provider]  metFORMIN (GLUCOPHAGE) 1000 MG tablet Take 1,000 mg by mouth 2 (two) times daily with a meal.    Yes [provider]  Multiple Vitamin (MULTIVITAMIN) capsule Take 1 capsule by mouth daily.   Yes [provider]  pantoprazole  (PROTONIX ) 40 MG tablet Take 1 tablet (40 mg total) by mouth 2 (two) times daily before a meal. 11/14/23  Yes Shirlean Therisa ORN, NP  PARoxetine (PAXIL-CR) 37.5 MG  24 hr tablet Take 37.5 mg by mouth daily. 05/20/23  Yes [provider]  propranolol  (INDERAL ) 20 MG tablet Take 10 mg by mouth every 6 (six) hours as needed (anxiety). 09/28/22  Yes [provider]  rosuvastatin  (CRESTOR ) 10 MG tablet Take 10 mg by mouth at bedtime.   Yes [provider]  tirzepatide  (MOUNJARO ) 5 MG/0.5ML Pen Inject 5 mg into the skin once a week. 10/25/23  Yes Nida, Gebreselassie W, MD  topiramate (TOPAMAX) 25 MG tablet Take 100 mg by mouth daily.   Yes [provider]  valACYclovir (VALTREX) 1000 MG tablet Take 1,000 mg by mouth. 10/12/23  Yes [provider]  Vitamin D , Ergocalciferol , (DRISDOL ) 1.25 MG (50000 UNIT) CAPS capsule Take 1 capsule (50,000 Units total) by mouth every 7 (seven) days. 07/20/23  Yes Shirlean Therisa ORN, NP  methocarbamol  (ROBAXIN ) 500 MG tablet  Take 1 tablet (500 mg total) by mouth 3 (three) times daily. Patient not taking: Reported on 11/22/2023 10/13/22   Trudy Duwaine BRAVO, NP    Blood pressure 110/76, pulse 84, temperature 97.6 F (36.4 C), temperature source Oral, height 5' 3 (1.6 m), weight 290 lb (131.5 kg), SpO2 96%. Exam: General: Communicates without difficulty, well nourished, no acute distress. Head: Normocephalic, no evidence injury, no tenderness, facial buttresses intact without stepoff. Face/sinus: No tenderness to palpation and percussion. Facial movement is normal and symmetric. Eyes: PERRL, EOMI. No scleral icterus, conjunctivae clear. Neuro: CN II exam reveals vision grossly intact.  No nystagmus at any point of gaze. Ears: Auricles well formed without lesions.  Right ear cerumen impaction.  The left ear canal and tympanic membrane are normal.  Nose: External evaluation reveals normal support and skin without lesions.  Dorsum is intact.  Anterior rhinoscopy reveals congested mucosa over anterior aspect of inferior turbinates and deviated septum.  No purulence noted. Oral:  Oral cavity and oropharynx are intact, symmetric, without erythema or edema.  Mucosa is moist without lesions. Neck: Full range of motion without pain.  There is no significant lymphadenopathy.  No masses palpable.  Thyroid  bed within normal limits to palpation.  Parotid glands and submandibular glands equal bilaterally without mass.  Trachea is midline. Neuro:  CN 2-12 grossly intact.   Procedure:  Flexible Nasal Endoscopy: Description: Risks, benefits, and alternatives of flexible endoscopy were explained to the patient.  Specific mention was made of the risk of throat numbness with difficulty swallowing, possible bleeding from the nose and mouth, and pain from the procedure.  The patient gave oral consent to proceed.  The flexible scope was inserted into the right nasal cavity.  Endoscopy of the interior nasal cavity, superior, inferior, and middle meatus  was performed. The sphenoid-ethmoid recess was examined. Edematous mucosa was noted.  No polyp, mass, or lesion was appreciated. Nasal septal deviation noted. Olfactory cleft was clear.  Nasopharynx was clear.  Turbinates were hypertrophied but without mass.  The procedure was repeated on the contralateral side with similar findings.  The patient tolerated the procedure well.   Procedure: Right ear cerumen disimpaction Anesthesia: None Description: Under the operating microscope, the cerumen is carefully removed with a combination of cerumen currette, alligator forceps, and suction catheters.  After the cerumen is removed, the TMs are noted to be normal.  No mass, erythema, or lesions. The patient tolerated the procedure well.   Assessment and Plan Assessment & Plan Chronic/recurrent sinusitis with deviated nasal septum and hypertrophy of nasal turbinates Chronic sinusitis with frequent infections, likely exacerbated by  a deviated nasal septum and hypertrophy of nasal turbinates. Previous CT scan was done 3-4 years ago, and a new scan is needed to assess current sinus condition. - Order sinus CT scan to evaluate current sinus condition - Continue Flonase nasal spray daily - Continue Zyrtec daily - The options of surgical intervention with septoplasty, bilateral turbinate reduction, and possible endoscopic sinus surgery are discussed.  Allergic rhinitis Allergic rhinitis managed with Flonase and Zyrtec. - Continue Flonase nasal spray daily - Continue Zyrtec daily  Cerumen impaction, right ear with associated tinnitus  Cerumen impaction in the right ear, possibly causing right ear conductive hearing loss and tinnitus.  - Otomicroscopy with right ear cerumen disimpaction.   Odaly Peri W Sarin [place] 11/22/2023, 4:15 PM

## 2023-11-23 ENCOUNTER — Ambulatory Visit: Admitting: Physical Medicine and Rehabilitation

## 2023-11-23 ENCOUNTER — Other Ambulatory Visit: Payer: Self-pay

## 2023-11-23 VITALS — BP 115/81 | HR 100

## 2023-11-23 DIAGNOSIS — H6121 Impacted cerumen, right ear: Secondary | ICD-10-CM | POA: Insufficient documentation

## 2023-11-23 DIAGNOSIS — H9011 Conductive hearing loss, unilateral, right ear, with unrestricted hearing on the contralateral side: Secondary | ICD-10-CM | POA: Insufficient documentation

## 2023-11-23 DIAGNOSIS — J324 Chronic pansinusitis: Secondary | ICD-10-CM | POA: Insufficient documentation

## 2023-11-23 DIAGNOSIS — J343 Hypertrophy of nasal turbinates: Secondary | ICD-10-CM | POA: Insufficient documentation

## 2023-11-23 DIAGNOSIS — H9311 Tinnitus, right ear: Secondary | ICD-10-CM | POA: Insufficient documentation

## 2023-11-23 DIAGNOSIS — M47816 Spondylosis without myelopathy or radiculopathy, lumbar region: Secondary | ICD-10-CM

## 2023-11-23 DIAGNOSIS — J342 Deviated nasal septum: Secondary | ICD-10-CM | POA: Insufficient documentation

## 2023-11-23 MED ORDER — BUPIVACAINE HCL 0.5 % IJ SOLN
3.0000 mL | Freq: Once | INTRAMUSCULAR | Status: AC
  Administered 2023-11-23: 3 mL

## 2023-11-23 NOTE — Progress Notes (Signed)
 Pain Scale   Average Pain 6 Patient advising she has lower back pain radiating bilaterally to legs, pain increases when walking and standing and eases when sitting.        +Driver, -BT, -Dye Allergies.

## 2023-11-24 ENCOUNTER — Telehealth: Payer: Self-pay | Admitting: Physical Medicine and Rehabilitation

## 2023-11-24 DIAGNOSIS — M47816 Spondylosis without myelopathy or radiculopathy, lumbar region: Secondary | ICD-10-CM

## 2023-11-24 NOTE — Telephone Encounter (Signed)
 I put in order for bilat RFA of L4-5 and L5-S1

## 2023-11-24 NOTE — Telephone Encounter (Signed)
 Patient called and said that [facility] told her to let you know that she is ok with two of the injection and now go ahead so she can get approved for the last one. CB#[phone]

## 2023-11-27 NOTE — Progress Notes (Signed)
 Melissa Schmidt - 48 y.o. female MRN [id number]  Date of birth: 1975-05-02  Office Visit Note: Visit Date: 11/23/2023 PCP: Marchelle Clem Pitts, MD Referred by: Marchelle Clem Pitts, *  Subjective: Chief Complaint  Patient presents with   Lower Back - Pain   HPI:  Melissa Schmidt is a 48 y.o. female who comes in today for planned repeat Bilateral L4-5 and L5-S1 Lumbar facet/medial branch block with fluoroscopic guidance.  The patient has failed conservative care including home exercise, medications, time and activity modification.  This injection will be diagnostic and hopefully therapeutic.  Please see requesting physician notes for further details and justification.  Exam shows concordant low back pain with facet joint loading and extension. Patient received more than 80% pain relief from prior injection. This would be the second block in a diagnostic double block paradigm.     Referring:Megan Trudy, FNP   ROS Otherwise per HPI.  Assessment & Plan: Visit Diagnoses:    ICD-10-CM   1. Spondylosis without myelopathy or radiculopathy, lumbar region  M47.816 XR C-ARM NO REPORT    Nerve Block    bupivacaine  (MARCAINE ) 0.5 % (with pres) injection 3 mL      Plan: No additional findings.   Meds & Orders:  Meds ordered this encounter  Medications   bupivacaine  (MARCAINE ) 0.5 % (with pres) injection 3 mL    Orders Placed This Encounter  Procedures   Nerve Block   XR C-ARM NO REPORT    Follow-up: Return for Review Pain Diary.   Procedures: No procedures performed  Lumbar Diagnostic Facet Joint Nerve Block with Fluoroscopic Guidance   Patient: Melissa Schmidt      Date of Birth: [birth year] MRN: [id number] PCP: Marchelle Clem Pitts, MD      Visit Date: 11/23/2023   Universal Protocol:    Date/Time: 10/20/258:45 PM  Consent Given By: the patient  Position: PRONE  Additional Comments: Vital signs were monitored before and after the procedure. Patient was prepped and draped  in the usual sterile fashion. The correct patient, procedure, and site was verified.   Injection Procedure Details:   Procedure diagnoses:  1. Spondylosis without myelopathy or radiculopathy, lumbar region      Meds Administered:  Meds ordered this encounter  Medications   bupivacaine  (MARCAINE ) 0.5 % (with pres) injection 3 mL     Laterality: Bilateral  Location/Site: L4-L5, L3 and L4 medial branches and L5-S1, L4 medial branch and L5 dorsal ramus  Needle: 5.0 in., 25 ga.  Short bevel or Quincke spinal needle  Needle Placement: Oblique pedical  Findings:   -Comments: There was excellent flow of contrast along the articular pillars without intravascular flow.  Procedure Details: The fluoroscope beam is vertically oriented in AP and then obliqued 15 to 20 degrees to the ipsilateral side of the desired nerve to achieve the "Scotty dog" appearance.  The skin over the target area of the junction of the superior articulating process and the transverse process (sacral ala if blocking the L5 dorsal rami) was locally anesthetized with a 1 ml volume of 1% Lidocaine  without Epinephrine.  The spinal needle was inserted and advanced in a trajectory view down to the target.   After contact with periosteum and negative aspirate for blood and CSF, correct placement without intravascular or epidural spread was confirmed by injecting 0.5 ml. of Isovue-250.  A spot radiograph was obtained of this image.    Next, a 0.5 ml. volume of the injectate described above was  injected. The needle was then redirected to the other facet joint nerves mentioned above if needed.  Prior to the procedure, the patient was given a Pain Diary which was completed for baseline measurements.  After the procedure, the patient rated their pain every 30 minutes and will continue rating at this frequency for a total of 5 hours.  The patient has been asked to complete the Diary and return to us  by mail, fax or hand delivered as  soon as possible.   Additional Comments:  The patient tolerated the procedure well Dressing: 2 x 2 sterile gauze and Band-Aid    Post-procedure details: Patient was observed during the procedure. Post-procedure instructions were reviewed.  Patient left the clinic in stable condition.   Clinical History: CLINICAL DATA:  Low back pain, symptoms persist with > 6 wks treatment   EXAM: MRI LUMBAR SPINE WITHOUT CONTRAST   TECHNIQUE: Multiplanar, multisequence MR imaging of the lumbar spine was performed. No intravenous contrast was administered.   COMPARISON:  None Available.   FINDINGS: Segmentation:  Standard.   Alignment:  Physiologic.   Vertebrae:  No fracture, evidence of discitis, or bone lesion.   Conus medullaris and cauda equina: Conus extends to the L1-L2 disc space level. Conus and cauda equina appear normal.   Paraspinal and other soft tissues: Negative.   Disc levels:   T12-L1: Unremarkable   L1-L2: Unremarkable   L2-L3: Unremarkable   L3-L4: Mild bilateral facet degenerative change. No spinal canal or neural foraminal narrowing.   L4-L5: Moderate to severe bilateral facet degenerative change. No spinal canal narrowing. No neural foraminal narrowing.   L5-S1: Moderate to severe bilateral facet degenerative change. No spinal canal narrowing. No neural foraminal narrowing.   IMPRESSION: 1. Moderate to severe bilateral facet degenerative change at L4-L5 and L5-S1. 2. No spinal canal or neural foraminal narrowing.     Electronically Signed   By: [name] M.D.   On: 01/04/2023 17:18     Objective:  VS:  HT:    WT:   BMI:     BP:115/81  HR:100bpm  TEMP: ( )  RESP:  Physical Exam Vitals and nursing note reviewed.  Constitutional:      General: She is not in acute distress.    Appearance: Normal appearance. She is obese. She is not ill-appearing.  HENT:     Head: Normocephalic and atraumatic.     Right Ear: External ear normal.      Left Ear: External ear normal.  Eyes:     Extraocular Movements: Extraocular movements intact.  Cardiovascular:     Rate and Rhythm: Normal rate.     Pulses: Normal pulses.  Pulmonary:     Effort: Pulmonary effort is normal. No respiratory distress.  Abdominal:     General: There is no distension.     Palpations: Abdomen is soft.  Musculoskeletal:        General: Tenderness present.     Cervical back: Neck supple.     Right lower leg: No edema.     Left lower leg: No edema.     Comments: Patient has good distal strength with no pain over the greater trochanters.  No clonus or focal weakness.  Skin:    Findings: No erythema, lesion or rash.  Neurological:     General: No focal deficit present.     Mental Status: She is alert and oriented to person, place, and time.     Sensory: No sensory deficit.  Motor: No weakness or abnormal muscle tone.     Coordination: Coordination normal.  Psychiatric:        Mood and Affect: Mood normal.        Behavior: Behavior normal.      Imaging: No results found.

## 2023-11-27 NOTE — Procedures (Signed)
 Lumbar Diagnostic Facet Joint Nerve Block with Fluoroscopic Guidance   Patient: Melissa Schmidt      Date of Birth: 21-Nov-1975 MRN: [id number] PCP: Marchelle Clem Pitts, MD      Visit Date: 11/23/2023   Universal Protocol:    Date/Time: 10/20/258:45 PM  Consent Given By: the patient  Position: PRONE  Additional Comments: Vital signs were monitored before and after the procedure. Patient was prepped and draped in the usual sterile fashion. The correct patient, procedure, and site was verified.   Injection Procedure Details:   Procedure diagnoses:  1. Spondylosis without myelopathy or radiculopathy, lumbar region      Meds Administered:  Meds ordered this encounter  Medications   bupivacaine  (MARCAINE ) 0.5 % (with pres) injection 3 mL     Laterality: Bilateral  Location/Site: L4-L5, L3 and L4 medial branches and L5-S1, L4 medial branch and L5 dorsal ramus  Needle: 5.0 in., 25 ga.  Short bevel or Quincke spinal needle  Needle Placement: Oblique pedical  Findings:   -Comments: There was excellent flow of contrast along the articular pillars without intravascular flow.  Procedure Details: The fluoroscope beam is vertically oriented in AP and then obliqued 15 to 20 degrees to the ipsilateral side of the desired nerve to achieve the "Scotty dog" appearance.  The skin over the target area of the junction of the superior articulating process and the transverse process (sacral ala if blocking the L5 dorsal rami) was locally anesthetized with a 1 ml volume of 1% Lidocaine  without Epinephrine.  The spinal needle was inserted and advanced in a trajectory view down to the target.   After contact with periosteum and negative aspirate for blood and CSF, correct placement without intravascular or epidural spread was confirmed by injecting 0.5 ml. of Isovue-250.  A spot radiograph was obtained of this image.    Next, a 0.5 ml. volume of the injectate described above was injected. The  needle was then redirected to the other facet joint nerves mentioned above if needed.  Prior to the procedure, the patient was given a Pain Diary which was completed for baseline measurements.  After the procedure, the patient rated their pain every 30 minutes and will continue rating at this frequency for a total of 5 hours.  The patient has been asked to complete the Diary and return to us  by mail, fax or hand delivered as soon as possible.   Additional Comments:  The patient tolerated the procedure well Dressing: 2 x 2 sterile gauze and Band-Aid    Post-procedure details: Patient was observed during the procedure. Post-procedure instructions were reviewed.  Patient left the clinic in stable condition.

## 2023-11-28 ENCOUNTER — Encounter (HOSPITAL_COMMUNITY)
Admission: RE | Admit: 2023-11-28 | Discharge: 2023-11-28 | Disposition: A | Discharge status: Home / Self Care | Source: Ambulatory Visit | Attending: Internal Medicine | Admitting: Internal Medicine

## 2023-11-28 ENCOUNTER — Other Ambulatory Visit (HOSPITAL_COMMUNITY)
Admission: RE | Admit: 2023-11-28 | Discharge: 2023-11-28 | Disposition: A | Source: Ambulatory Visit | Attending: Internal Medicine | Admitting: Internal Medicine

## 2023-11-28 ENCOUNTER — Encounter (HOSPITAL_COMMUNITY): Payer: Self-pay

## 2023-11-28 ENCOUNTER — Other Ambulatory Visit: Payer: Self-pay

## 2023-11-28 VITALS — Ht 63.0 in | Wt 290.0 lb

## 2023-11-28 DIAGNOSIS — Z01812 Encounter for preprocedural laboratory examination: Secondary | ICD-10-CM | POA: Insufficient documentation

## 2023-11-28 DIAGNOSIS — I10 Essential (primary) hypertension: Secondary | ICD-10-CM | POA: Diagnosis not present

## 2023-11-28 DIAGNOSIS — K900 Celiac disease: Secondary | ICD-10-CM | POA: Insufficient documentation

## 2023-11-28 DIAGNOSIS — Z6841 Body Mass Index (BMI) 40.0 and over, adult: Secondary | ICD-10-CM | POA: Diagnosis not present

## 2023-11-28 DIAGNOSIS — Z79899 Other long term (current) drug therapy: Secondary | ICD-10-CM | POA: Diagnosis not present

## 2023-11-28 DIAGNOSIS — R1010 Upper abdominal pain, unspecified: Secondary | ICD-10-CM | POA: Insufficient documentation

## 2023-11-28 DIAGNOSIS — E6689 Other obesity not elsewhere classified: Secondary | ICD-10-CM | POA: Diagnosis not present

## 2023-11-28 DIAGNOSIS — K317 Polyp of stomach and duodenum: Secondary | ICD-10-CM | POA: Diagnosis not present

## 2023-11-28 DIAGNOSIS — K7581 Nonalcoholic steatohepatitis (NASH): Secondary | ICD-10-CM | POA: Diagnosis not present

## 2023-11-28 DIAGNOSIS — R1013 Epigastric pain: Secondary | ICD-10-CM | POA: Diagnosis present

## 2023-11-28 DIAGNOSIS — R1310 Dysphagia, unspecified: Secondary | ICD-10-CM | POA: Diagnosis not present

## 2023-11-28 DIAGNOSIS — E119 Type 2 diabetes mellitus without complications: Secondary | ICD-10-CM | POA: Diagnosis not present

## 2023-11-28 DIAGNOSIS — Z9049 Acquired absence of other specified parts of digestive tract: Secondary | ICD-10-CM | POA: Diagnosis not present

## 2023-11-28 DIAGNOSIS — Z01818 Encounter for other preprocedural examination: Secondary | ICD-10-CM

## 2023-11-28 HISTORY — DX: Gastro-esophageal reflux disease without esophagitis: K21.9

## 2023-11-28 HISTORY — DX: Unspecified asthma, uncomplicated: J45.909

## 2023-11-28 HISTORY — DX: Fatty (change of) liver, not elsewhere classified: K76.0

## 2023-11-28 HISTORY — DX: Depression, unspecified: F32.A

## 2023-11-28 HISTORY — DX: Anxiety disorder, unspecified: F41.9

## 2023-11-28 LAB — PREGNANCY, URINE: Preg Test, Ur: NEGATIVE

## 2023-11-29 ENCOUNTER — Telehealth: Payer: Self-pay

## 2023-11-29 NOTE — Telephone Encounter (Signed)
 PA approved for Pantoprazole  Sodium 40 mg through 12/13/2023 as long as you are covered under Part D benefit. Pt made aware of this.

## 2023-11-29 NOTE — Telephone Encounter (Signed)
 PA done for Pantoprazole  Sodium 40 mg DR tablets on Cover My Meds. Dx used: K21.9 (GERD). Pt has tried and failed Omeprazole and Famotide. Waiting on a response from [employer].

## 2023-11-30 NOTE — Telephone Encounter (Signed)
 noted

## 2023-12-01 ENCOUNTER — Ambulatory Visit (HOSPITAL_COMMUNITY): Admitting: Anesthesiology

## 2023-12-01 ENCOUNTER — Encounter (HOSPITAL_COMMUNITY)
Admission: RE | Disposition: A | Discharge status: Home / Self Care | Payer: Self-pay | Source: Home / Self Care | Attending: Internal Medicine

## 2023-12-01 ENCOUNTER — Ambulatory Visit (HOSPITAL_COMMUNITY)
Admission: RE | Admit: 2023-12-01 | Discharge: 2023-12-01 | Disposition: A | Discharge status: Home / Self Care | Attending: Internal Medicine | Admitting: Internal Medicine

## 2023-12-01 ENCOUNTER — Encounter (HOSPITAL_COMMUNITY): Payer: Self-pay | Admitting: Internal Medicine

## 2023-12-01 ENCOUNTER — Other Ambulatory Visit: Payer: Self-pay

## 2023-12-01 DIAGNOSIS — K3189 Other diseases of stomach and duodenum: Secondary | ICD-10-CM | POA: Diagnosis not present

## 2023-12-01 DIAGNOSIS — Z79899 Other long term (current) drug therapy: Secondary | ICD-10-CM | POA: Insufficient documentation

## 2023-12-01 DIAGNOSIS — R1310 Dysphagia, unspecified: Secondary | ICD-10-CM | POA: Diagnosis not present

## 2023-12-01 DIAGNOSIS — K7581 Nonalcoholic steatohepatitis (NASH): Secondary | ICD-10-CM | POA: Insufficient documentation

## 2023-12-01 DIAGNOSIS — R1013 Epigastric pain: Secondary | ICD-10-CM | POA: Diagnosis not present

## 2023-12-01 DIAGNOSIS — I10 Essential (primary) hypertension: Secondary | ICD-10-CM | POA: Insufficient documentation

## 2023-12-01 DIAGNOSIS — E6689 Other obesity not elsewhere classified: Secondary | ICD-10-CM | POA: Insufficient documentation

## 2023-12-01 DIAGNOSIS — Z6841 Body Mass Index (BMI) 40.0 and over, adult: Secondary | ICD-10-CM | POA: Insufficient documentation

## 2023-12-01 DIAGNOSIS — Z01818 Encounter for other preprocedural examination: Secondary | ICD-10-CM

## 2023-12-01 DIAGNOSIS — E119 Type 2 diabetes mellitus without complications: Secondary | ICD-10-CM | POA: Insufficient documentation

## 2023-12-01 DIAGNOSIS — K900 Celiac disease: Secondary | ICD-10-CM | POA: Insufficient documentation

## 2023-12-01 DIAGNOSIS — K317 Polyp of stomach and duodenum: Secondary | ICD-10-CM | POA: Insufficient documentation

## 2023-12-01 DIAGNOSIS — Z9049 Acquired absence of other specified parts of digestive tract: Secondary | ICD-10-CM | POA: Insufficient documentation

## 2023-12-01 HISTORY — PX: ESOPHAGOGASTRODUODENOSCOPY: SHX5428

## 2023-12-01 LAB — GLUCOSE, CAPILLARY: Glucose-Capillary: 92 mg/dL (ref 70–99)

## 2023-12-01 SURGERY — EGD (ESOPHAGOGASTRODUODENOSCOPY)
Anesthesia: General

## 2023-12-01 MED ORDER — LACTATED RINGERS IV SOLN: INTRAVENOUS | Status: DC | PRN

## 2023-12-01 MED ORDER — PROPOFOL 10 MG/ML IV BOLUS
INTRAVENOUS | Status: DC | PRN
Start: 2023-12-01 — End: 2023-12-01
  Administered 2023-12-01: 50 mg via INTRAVENOUS
  Administered 2023-12-01: 40 mg via INTRAVENOUS
  Administered 2023-12-01: 70 mg via INTRAVENOUS
  Administered 2023-12-01: 40 mg via INTRAVENOUS
  Administered 2023-12-01: 125 ug/kg/min via INTRAVENOUS
  Administered 2023-12-01: 50 mg via INTRAVENOUS
  Administered 2023-12-01: 30 mg via INTRAVENOUS

## 2023-12-01 MED ORDER — LIDOCAINE 2% (20 MG/ML) 5 ML SYRINGE
INTRAMUSCULAR | Status: DC | PRN
Start: 2023-12-01 — End: 2023-12-01
  Administered 2023-12-01: 100 mg via INTRAVENOUS

## 2023-12-01 NOTE — Transfer of Care (Signed)
 Immediate Anesthesia Transfer of Care Note  Patient: Melissa Schmidt  Procedure(s) Performed: EGD (ESOPHAGOGASTRODUODENOSCOPY)  Patient Location: Short Stay  Anesthesia Type:General  Level of Consciousness: drowsy and patient cooperative  Airway & Oxygen Therapy: Patient Spontanous Breathing  Post-op Assessment: Report given to RN and Post -op Vital signs reviewed and stable  Post vital signs: Reviewed and stable  Last Vitals:  Vitals Value Taken Time  BP 106/65 12/01/23 10:28  Temp 36.7 C 12/01/23 10:28  Pulse 94 12/01/23 10:28  Resp 23 12/01/23 10:28  SpO2 97 % 12/01/23 10:28    Last Pain:  Vitals:   12/01/23 1028  TempSrc: Oral  PainSc: Asleep      Patients Stated Pain Goal: 4 (12/01/23 0754)  Complications: No notable events documented.

## 2023-12-01 NOTE — Op Note (Signed)
 [facility] Patient Name: Melissa Schmidt Procedure Date: 12/01/2023 9:35 AM MRN: [id number] Date of Birth: Jan 19, 1976 Attending MD: Lamar Ozell Hollingshead , MD, [id number] CSN: [id number] Age: 48 Admit Type: Outpatient Procedure:                Upper GI endoscopy Indications:              Dyspepsia, Dysphagia Providers:                Lamar Ozell Hollingshead, MD, Madelin Hunter, RN, Dorcas Lenis, Technician Referring MD:              Medicines:                Propofol  per Anesthesia Complications:            No immediate complications. Estimated Blood Loss:     Estimated blood loss was minimal. Procedure:                Pre-Anesthesia Assessment:                           - Prior to the procedure, a History and Physical                            was performed, and patient medications and                            allergies were reviewed. The patient's tolerance of                            previous anesthesia was also reviewed. The risks                            and benefits of the procedure and the sedation                            options and risks were discussed with the patient.                            All questions were answered, and informed consent                            was obtained. Prior Anticoagulants: The patient has                            taken no anticoagulant or antiplatelet agents. ASA                            Grade Assessment: III - A patient with severe                            systemic disease. After reviewing the risks and  benefits, the patient was deemed in satisfactory                            condition to undergo the procedure.                           After obtaining informed consent, the endoscope was                            passed under direct vision. Throughout the                            procedure, the patient's blood pressure, pulse, and                            oxygen  saturations were monitored continuously. The                            [id number] ([id number]) Upper was introduced through                            the mouth, and advanced to the third part of                            duodenum. The upper GI endoscopy was accomplished                            without difficulty. The patient tolerated the                            procedure well. Scope In: 10:03:25 AM Scope Out: 10:17:26 AM Total Procedure Duration: 0 hours 14 minutes 1 second  Findings:      The examined esophagus was normal. Gastric cavity empty. Scattered 1 to       2 mm gastric polyps largest 5 mm. No ulcer or infiltrating process       pylorus patent.      Examination of the bulb second third portion revealed some crpe paper       appearance and cobblestoning of the bulb. However, the 2nd and 3rd       portions of the duodenal mucosa appeared normal. Biopsies of D1 and       D2-D3 taken for histologic study. They were submitted together.The scope       was withdrawn. Dilation was performed with a Maloney dilator with mild       resistance at 54 Fr. The dilation site was examined following endoscope       reinsertion and showed no change. Estimated blood loss: none. Finally,       biopsies of the mid and distal esophagus were taken and submitted       separately and the largest polyp in the stomach was cold biopsy/removed.. Impression:               - Normal esophagus. Dilated and biopsied..                           - Gastric polyp?"removed with  cold biopsy                           - Duodenum biopsied..                           - If abdominal symptoms persist may consider                            imaging the more distal small bowel Moderate Sedation:      Moderate (conscious) sedation was personally administered by an       anesthesia professional. The following parameters were monitored: oxygen       saturation, heart rate, blood pressure, respiratory rate, EKG, adequacy        of pulmonary ventilation, and response to care. Recommendation:           - Patient has a contact number available for                            emergencies. The signs and symptoms of potential                            delayed complications were discussed with the                            patient. Return to normal activities tomorrow.                            Written discharge instructions were provided to the                            patient.                           - Advance diet as tolerated. Follow-up on                            pathology. Keep upcoming office appointment. Procedure Code(s):        --- Professional ---                           [id number], Esophagogastroduodenoscopy, flexible,                            transoral; diagnostic, including collection of                            specimen(s) by brushing or washing, when performed                            (separate procedure)                           43450, Dilation of esophagus, by unguided sound or                            bougie, single  or multiple passes Diagnosis Code(s):        --- Professional ---                           R10.13, Epigastric pain                           R13.10, Dysphagia, unspecified CPT copyright 2022 American Medical Association. All rights reserved. The codes documented in this report are preliminary and upon coder review may  be revised to meet current compliance requirements. Lamar HERO. Raneshia Derick, MD Lamar Ozell Hollingshead, MD 12/01/2023 10:29:56 AM This report has been signed electronically. Number of Addenda: 0

## 2023-12-01 NOTE — Anesthesia Procedure Notes (Signed)
 Date/Time: 12/01/2023 9:57 AM  Performed by: Para Jerelene CROME, CRNAOxygen Delivery Method: Nasal cannula Comments: OptiFlow Nasal Cannula.

## 2023-12-01 NOTE — Anesthesia Preprocedure Evaluation (Signed)
 Anesthesia Evaluation  Patient identified by MRN, date of birth, ID band Patient awake    Reviewed: Allergy & Precautions, H&P , NPO status , Patient's Chart, lab work & pertinent test results, reviewed documented beta blocker date and time   History of Anesthesia Complications (+) history of anesthetic complications  Airway Mallampati: II  TM Distance: >3 FB Neck ROM: full    Dental no notable dental hx.    Pulmonary shortness of breath, asthma    Pulmonary exam normal breath sounds clear to auscultation       Cardiovascular Exercise Tolerance: Good hypertension, + DOE   Rhythm:regular Rate:Normal     Neuro/Psych  PSYCHIATRIC DISORDERS Anxiety Depression    negative neurological ROS     GI/Hepatic ,GERD  ,,(+) Hepatitis -  Endo/Other  diabetesHypothyroidism Hyperthyroidism Class 4 obesity  Renal/GU negative Renal ROS  negative genitourinary   Musculoskeletal   Abdominal   Peds  Hematology  (+) Blood dyscrasia, anemia   Anesthesia Other Findings   Reproductive/Obstetrics negative OB ROS                              Anesthesia Physical Anesthesia Plan  ASA: 3  Anesthesia Plan: General   Post-op Pain Management:    Induction:   PONV Risk Score and Plan: Propofol  infusion  Airway Management Planned:   Additional Equipment:   Intra-op Plan:   Post-operative Plan:   Informed Consent: I have reviewed the patients History and Physical, chart, labs and discussed the procedure including the risks, benefits and alternatives for the proposed anesthesia with the patient or authorized representative who has indicated his/her understanding and acceptance.     Dental Advisory Given  Plan Discussed with: CRNA  Anesthesia Plan Comments:         Anesthesia Quick Evaluation

## 2023-12-01 NOTE — Interval H&P Note (Signed)
 History and Physical Interval Note:  12/01/2023 9:47 AM  Melissa Schmidt Melissa Schmidt  has presented today for surgery, with the diagnosis of abdominal pain, hx of celiac disease.  The various methods of treatment have been discussed with the patient and family. After consideration of risks, benefits and other options for treatment, the patient has consented to  Procedure(s) with comments: EGD (ESOPHAGOGASTRODUODENOSCOPY) (N/A) - 9:15 AM, ASA 3 as a surgical intervention.  The patient's history has been reviewed, patient examined, no change in status, stable for surgery.  I have reviewed the patient's chart and labs.  Questions were answered to the patient's satisfaction.     Melissa Schmidt    No change.  Elevated celiac markers.  History of celiac disease.  Dyspepsia diagnostic EGD today per plan.   Patient also endorses esophageal dysphagia ongoing.  Previous dilations in [place].  Consequently, I have offered her an EGD with esophageal dilation excetra per plan.  The risks, benefits, limitations, alternatives and imponderables have been reviewed with the patient. Potential for esophageal dilation, biopsy, etc. have also been reviewed.  Questions have been answered. All parties agreeable.   Discussed with anesthesia.

## 2023-12-01 NOTE — Anesthesia Postprocedure Evaluation (Signed)
 Anesthesia Post Note  Patient: Melissa Schmidt  Procedure(s) Performed: EGD (ESOPHAGOGASTRODUODENOSCOPY)  Patient location during evaluation: Phase II Anesthesia Type: General Level of consciousness: awake Pain management: pain level controlled Vital Signs Assessment: post-procedure vital signs reviewed and stable Respiratory status: spontaneous breathing and respiratory function stable Cardiovascular status: blood pressure returned to baseline and stable Postop Assessment: no headache and no apparent nausea or vomiting Anesthetic complications: no Comments: Late entry   No notable events documented.   Last Vitals:  Vitals:   12/01/23 0754 12/01/23 1028  BP: 125/68 106/65  Pulse: 82 94  Resp: 17 (!) 23  Temp: 36.8 C 36.7 C  SpO2: 100% 97%    Last Pain:  Vitals:   12/01/23 1028  TempSrc: Oral  PainSc: Asleep                 Yvonna JINNY Bosworth

## 2023-12-01 NOTE — Discharge Instructions (Addendum)
 EGD Discharge instructions Please read the instructions outlined below and refer to this sheet in the next few weeks. These discharge instructions provide you with general information on caring for yourself after you leave the hospital. Your doctor may also give you specific instructions. While your treatment has been planned according to the most current medical practices available, unavoidable complications occasionally occur. If you have any problems or questions after discharge, please call your doctor. ACTIVITY You may resume your regular activity but move at a slower pace for the next 24 hours.  Take frequent rest periods for the next 24 hours.  Walking will help expel (get rid of) the air and reduce the bloated feeling in your abdomen.  No driving for 24 hours (because of the anesthesia (medicine) used during the test).  You may shower.  Do not sign any important legal documents or operate any machinery for 24 hours (because of the anesthesia used during the test).  NUTRITION Drink plenty of fluids.  You may resume your normal diet.  Begin with a light meal and progress to your normal diet.  Avoid alcoholic beverages for 24 hours or as instructed by your caregiver.  MEDICATIONS You may resume your normal medications unless your caregiver tells you otherwise.  WHAT YOU CAN EXPECT TODAY You may experience abdominal discomfort such as a feeling of fullness or "gas" pains.  FOLLOW-UP Your doctor will discuss the results of your test with you.  SEEK IMMEDIATE MEDICAL ATTENTION IF ANY OF THE FOLLOWING OCCUR: Excessive nausea (feeling sick to your stomach) and/or vomiting.  Severe abdominal pain and distention (swelling).  Trouble swallowing.  Temperature over 101 F (37.8 C).  Rectal bleeding or vomiting of blood.     your esophagus was stretched today.  You had a stomach polyp which was removed  Your duodenum really looked pretty good biopsies taken   keep your upcoming appointment  for hemorrhoid banding  Further recommendations to follow pending review of pathology report   at patient request, I called Ronnie at [phone] findings and recommendations

## 2023-12-04 ENCOUNTER — Ambulatory Visit (HOSPITAL_COMMUNITY)
Admission: RE | Admit: 2023-12-04 | Discharge: 2023-12-04 | Disposition: A | Discharge status: Home / Self Care | Source: Ambulatory Visit | Attending: Otolaryngology | Admitting: Otolaryngology

## 2023-12-04 ENCOUNTER — Encounter (HOSPITAL_COMMUNITY): Payer: Self-pay | Admitting: Internal Medicine

## 2023-12-04 DIAGNOSIS — J324 Chronic pansinusitis: Secondary | ICD-10-CM | POA: Diagnosis present

## 2023-12-04 LAB — SURGICAL PATHOLOGY

## 2023-12-05 ENCOUNTER — Ambulatory Visit: Payer: Self-pay | Admitting: Internal Medicine

## 2023-12-06 ENCOUNTER — Ambulatory Visit: Admitting: Family

## 2023-12-11 ENCOUNTER — Encounter: Payer: Self-pay | Admitting: Radiology

## 2023-12-12 ENCOUNTER — Ambulatory Visit (INDEPENDENT_AMBULATORY_CARE_PROVIDER_SITE_OTHER): Admitting: Family

## 2023-12-12 DIAGNOSIS — M76829 Posterior tibial tendinitis, unspecified leg: Secondary | ICD-10-CM

## 2023-12-12 MED ORDER — DICLOFENAC SODIUM 1 % EX GEL: 2.0000 g | Freq: Four times a day (QID) | CUTANEOUS | 1 refills | Status: AC | PRN

## 2023-12-12 NOTE — Progress Notes (Unsigned)
 Office Visit Note   Patient: Melissa Schmidt           Date of Birth: March 09, 1975           MRN: [id number] Visit Date: 12/12/2023              Requested by: Marchelle Clem Pitts, MD [address] [place],  [place] [id number] PCP: Marchelle Clem Pitts, MD  Chief Complaint  Patient presents with   Right Ankle - Pain      HPI: The patient is a 48 year old woman who presents in follow-up for right ankle pain she is having medial ankle plate pain.  She has previously been provided an ASO and has completed 4 weeks of physical therapy with minimal improvement she continues to have significant pain with walking and weightbearing intermittent swelling the pain does shoot across the dorsal lateral foot  Assessment & Plan: Visit Diagnoses: No diagnosis found.  Plan: Placed in a cam walker with an arch support pad she will wear this for the next 4 weeks.  May use Tylenol  for pain  Unable to tolerate NSAIDs by mouth she will also use Voltaren topically  If she fails to improve in the next 4 weeks consider referral to Dr. Burnetta  Follow-Up Instructions: No follow-ups on file.   Ortho Exam  Patient is alert, oriented, no adenopathy, well-dressed, normal affect, normal respiratory effort. On examination right ankle there is no edema present does have positive too many toes sign.  She has pain and is unable to perform a single limb heel raise on the right    Imaging: No results found. No images are attached to the encounter.  Labs: Lab Results  Component Value Date   HGBA1C 7.8 (A) 10/25/2023   HGBA1C 11.4 07/19/2023   HGBA1C 7.6 (H) 11/09/2022     Lab Results  Component Value Date   ALBUMIN 4.0 11/09/2023   ALBUMIN 4.0 07/07/2023   ALBUMIN 4.1 07/05/2023    No results found for: MG Lab Results  Component Value Date   VD25OH 23.9 (L) 07/07/2023   VD25OH 19.0 (L) 07/05/2023   VD25OH 25.4 (L) 10/28/2022    No results found for: PREALBUMIN    Latest Ref Rng & Units  11/09/2023    4:17 PM 07/24/2023    3:36 PM 07/05/2023    4:42 PM  CBC EXTENDED  WBC 3.4 - 10.8 x10E3/uL 12.2  10.8  10.0   RBC 3.77 - 5.28 x10E6/uL 4.88  4.98  5.38   Hemoglobin 11.1 - 15.9 g/dL 87.4  86.9  86.1   HCT 34.0 - 46.6 % 39.2  40.5  44.3   Platelets 150 - 450 x10E3/uL 386  310  297   NEUT# 1.4 - 7.0 x10E3/uL 8.6  7.2  6.8   Lymph# 0.7 - 3.1 x10E3/uL 2.7  2.6  2.3      There is no height or weight on file to calculate BMI.  Orders:  No orders of the defined types were placed in this encounter.  Meds ordered this encounter  Medications   diclofenac Sodium (VOLTAREN) 1 % GEL    Sig: Apply 2 g topically 4 (four) times daily as needed.    Dispense:  50 g    Refill:  1     Procedures: No procedures performed  Clinical Data: No additional findings.  ROS:  All other systems negative, except as noted in the HPI. Review of Systems  Objective: Vital Signs: There were no vitals taken  for this visit.  Specialty Comments:  CLINICAL DATA:  Low back pain, symptoms persist with > 6 wks treatment   EXAM: MRI LUMBAR SPINE WITHOUT CONTRAST   TECHNIQUE: Multiplanar, multisequence MR imaging of the lumbar spine was performed. No intravenous contrast was administered.   COMPARISON:  None Available.   FINDINGS: Segmentation:  Standard.   Alignment:  Physiologic.   Vertebrae:  No fracture, evidence of discitis, or bone lesion.   Conus medullaris and cauda equina: Conus extends to the L1-L2 disc space level. Conus and cauda equina appear normal.   Paraspinal and other soft tissues: Negative.   Disc levels:   T12-L1: Unremarkable   L1-L2: Unremarkable   L2-L3: Unremarkable   L3-L4: Mild bilateral facet degenerative change. No spinal canal or neural foraminal narrowing.   L4-L5: Moderate to severe bilateral facet degenerative change. No spinal canal narrowing. No neural foraminal narrowing.   L5-S1: Moderate to severe bilateral facet degenerative change.  No spinal canal narrowing. No neural foraminal narrowing.   IMPRESSION: 1. Moderate to severe bilateral facet degenerative change at L4-L5 and L5-S1. 2. No spinal canal or neural foraminal narrowing.     Electronically Signed   By: [name] M.D.   On: 01/04/2023 17:18  PMFS History: Patient Active Problem List   Diagnosis Date Noted   Impacted cerumen of right ear 11/23/2023   Right-sided tinnitus 11/23/2023   Conductive hearing loss in right ear 11/23/2023   Deviated nasal septum 11/23/2023   Hypertrophy of nasal turbinates 11/23/2023   Chronic pansinusitis 11/23/2023   Upper airway cough syndrome 07/24/2023   Metabolic dysfunction-associated steatohepatitis (MASH) 05/11/2023   DOE (dyspnea on exertion) 05/02/2023   Sinus tachycardia 12/01/2022   Chronic cholecystitis 11/07/2022   Mixed hyperlipidemia 05/16/2022   Morbid obesity (HCC) 04/06/2021   IDA (iron deficiency anemia) 08/20/2020   Vitamin D  deficiency 08/20/2020   Type 2 diabetes mellitus without complication, without long-term current use of insulin  (HCC) 07/21/2020   Iron deficiency anemia 12/31/2019   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   Hemorrhoids 12/11/2018   Graves' disease 11/27/2018   RUQ abdominal pain 10/10/2018   Celiac disease 10/10/2018   GERD (gastroesophageal reflux disease) 10/10/2018   Hyperthyroidism 10/10/2017   Uncontrolled type 2 diabetes mellitus with hyperglycemia (HCC) 10/10/2017   Mucosal abnormality of stomach    Dysphagia    Positive autoantibody screening for celiac disease 04/29/2015   Fatty liver 03/19/2015   Colicky RUQ abdominal pain 03/19/2015   RLQ abdominal pain 03/19/2015   Rectal bleeding 03/19/2015   Past Medical History:  Diagnosis Date   Anxiety    Arthritis    Asthma    Borderline diabetes    Celiac disease    Complication of anesthesia     I have shaking all over when I am waking up from anesthesia   Depression    Diabetes  mellitus without complication (HCC)    Dyspnea    Fatty liver    GERD (gastroesophageal reflux disease)    Hemorrhoids    Hyperthyroidism    Hypertriglyceridemia    PCOS (polycystic ovarian syndrome)     Family History  Problem Relation Age of Onset   Irritable bowel syndrome Mother    Colon polyps Mother        ? 5 year surveillance   Inflammatory bowel disease Neg Hx    Colon cancer Neg Hx    Liver disease Neg Hx     Past Surgical History:  Procedure Laterality Date  APPENDECTOMY  2002   CHOLECYSTECTOMY  11/2022   COLONOSCOPY N/A 06/14/2023   Procedure: COLONOSCOPY;  Surgeon: Shaaron Lamar HERO, MD;  Location: [facility];  Service: Endoscopy;  Laterality: N/A;  815am, asa 3   COLONOSCOPY WITH PROPOFOL  N/A 05/25/2015   Dr.Rourk- non-bleeding hemorrhoids, the examination was o/w normal   ESOPHAGEAL DILATION N/A 05/25/2015   Procedure: ESOPHAGEAL DILATION;  Surgeon: Lamar HERO Shaaron, MD;  Location: [facility];  Service: Endoscopy;  Laterality: N/A;   ESOPHAGOGASTRODUODENOSCOPY  04/2017   [place]: EGD with irregular Z-line s/p biopsy, benign-appearing esophageal stenosis s/p dilation, normal examined duodenum but no biopsies of duodenum. Pathology with junctional type GE mucosa with chronic inflammation and surface erosion, no metaplasia, negative dysplasia.    ESOPHAGOGASTRODUODENOSCOPY N/A 12/01/2023   Procedure: EGD (ESOPHAGOGASTRODUODENOSCOPY);  Surgeon: Shaaron Lamar HERO, MD;  Location: [facility];  Service: Endoscopy;  Laterality: N/A;  9:15 AM, ASA 3   ESOPHAGOGASTRODUODENOSCOPY (EGD) WITH PROPOFOL  N/A 05/25/2015   Dr.Rourk- normal esophagus, dilated, gastric erosions, intact fundoplication, normal third portion of the duodenum, non-bleeding erosive gastropathy. duodenum bx= benign small bowel mucosa with patchy increased intraepithelial lymphocytes, mild villous blunting and mild crypt hyperplasia, no dysplasia or malignancy stomach bx= erosive gastritis with reactive  changes   LIVER BIOPSY N/A 11/11/2022   Procedure: LIVER BIOPSY;  Surgeon: Kallie Manuelita BROCKS, MD;  Location: [facility];  Service: General;  Laterality: N/A;   NISSEN FUNDOPLICATION  2005   Social History   Occupational History   Occupation: in school  Tobacco Use   Smoking status: Never   Smokeless tobacco: Never  Vaping Use   Vaping status: Never Used  Substance and Sexual Activity   Alcohol use: No    Alcohol/week: 0.0 standard drinks of alcohol   Drug use: No   Sexual activity: Yes    Birth control/protection: None

## 2023-12-13 ENCOUNTER — Encounter: Payer: Self-pay | Admitting: Family

## 2023-12-19 ENCOUNTER — Other Ambulatory Visit: Payer: Self-pay | Admitting: Physical Medicine and Rehabilitation

## 2023-12-19 MED ORDER — DIAZEPAM 5 MG PO TABS: ORAL_TABLET | ORAL | 0 refills | Status: DC

## 2023-12-21 ENCOUNTER — Ambulatory Visit (INDEPENDENT_AMBULATORY_CARE_PROVIDER_SITE_OTHER): Admitting: Gastroenterology

## 2023-12-21 ENCOUNTER — Encounter: Payer: Self-pay | Admitting: *Deleted

## 2023-12-21 ENCOUNTER — Encounter: Payer: Self-pay | Admitting: Gastroenterology

## 2023-12-21 VITALS — BP 116/78 | HR 69 | Temp 97.9°F | Ht 63.0 in | Wt 280.4 lb

## 2023-12-21 DIAGNOSIS — K641 Second degree hemorrhoids: Secondary | ICD-10-CM | POA: Diagnosis not present

## 2023-12-21 DIAGNOSIS — K900 Celiac disease: Secondary | ICD-10-CM

## 2023-12-21 DIAGNOSIS — K642 Third degree hemorrhoids: Secondary | ICD-10-CM

## 2023-12-21 DIAGNOSIS — K625 Hemorrhage of anus and rectum: Secondary | ICD-10-CM

## 2023-12-21 NOTE — Patient Instructions (Signed)
  Please avoid straining.  You should limit your toilet time to 2-3 minutes at the most.   I recommend Benefiber 2 teaspoons each morning in the beverage of your choice!  Please call me with any concerns or issues!  I will see you in follow-up for additional banding in several weeks.  We are also arranging the capsule study of your small intestine!   I enjoyed seeing you again today! I value our relationship and want to provide genuine, compassionate, and quality care. You may receive a survey regarding your visit with me, and I welcome your feedback! Thanks so much for taking the time to complete this. I look forward to seeing you again.      Therisa [name], PhD, ANP-BC [facility]

## 2023-12-21 NOTE — Progress Notes (Signed)
    [facility] BANDING PROCEDURE NOTE  Melissa Schmidt is a 48 y.o. female presenting today for consideration of hemorrhoid banding. Last colonoscopy  May 2025: non-bleeding external and internal hemorrhoids, otherwise normal.  she has had left lateral and right posterior banding. In interim from last banding, she had EGD due to abdominal pain. Known hx of celiac disease with active disease on small bowel biopsies. Abdominal pain improving. Trying to avoid gluten and is strict with diet; she is investigating meds and packaging.    The patient presents with symptomatic grade 2-3 hemorrhoids, unresponsive to maximal medical therapy, requesting rubber band ligation of her hemorrhoidal disease. All risks, benefits, and alternative forms of therapy were described and informed consent was obtained.  The decision was made to band the right anterior internal hemorrhoid, and the [facility] System was used to perform band ligation without complication.I then elected to band the left lateral, and the system deployed band without complication. Unable to palpate band of left lateral and query if insufficient tissue.  Digital anorectal examination was then performed to assure proper positioning of the band, and to adjust the banded tissue as required. The patient was discharged home without pain or other issues. Dietary and behavioral recommendations were given, along with follow-up instructions. The patient will return in several weeks for followup and possible additional banding as required. I have also ordered a capsule study for further evaluation of IDA and active celiac disease.   No complications were encountered and the patient tolerated the procedure well.   Therisa [name], PhD, ANP-BC [facility]

## 2023-12-27 ENCOUNTER — Encounter (INDEPENDENT_AMBULATORY_CARE_PROVIDER_SITE_OTHER): Payer: Self-pay | Admitting: Otolaryngology

## 2023-12-27 ENCOUNTER — Ambulatory Visit (INDEPENDENT_AMBULATORY_CARE_PROVIDER_SITE_OTHER): Admitting: Otolaryngology

## 2023-12-27 VITALS — BP 113/78 | HR 84 | Temp 98.0°F | Ht 63.0 in | Wt 290.0 lb

## 2023-12-27 DIAGNOSIS — J342 Deviated nasal septum: Secondary | ICD-10-CM | POA: Diagnosis not present

## 2023-12-27 DIAGNOSIS — J343 Hypertrophy of nasal turbinates: Secondary | ICD-10-CM | POA: Diagnosis not present

## 2023-12-27 DIAGNOSIS — J3489 Other specified disorders of nose and nasal sinuses: Secondary | ICD-10-CM | POA: Diagnosis not present

## 2023-12-27 DIAGNOSIS — J320 Chronic maxillary sinusitis: Secondary | ICD-10-CM | POA: Diagnosis not present

## 2023-12-28 NOTE — Progress Notes (Signed)
 Patient ID: Melissa Schmidt, female   DOB: [birth year], 48 y.o.   MRN: [id number]  Follow-up: Chronic nasal obstruction, recurrent sinusitis  HPI: The patient is a 49 year old female who returns today for her follow-up evaluation.  She was last seen in October 2025.  At that time, she was complaining of chronic nasal obstruction and recurrent sinusitis.  She was treated with multiple courses of antibiotics, allergy medications, Flonase nasal spray, and nasal saline irrigation.  Despite the treatment, she continues to be symptomatic.  She recently underwent a sinus CT scan.  The CT showed mild maxillary mucosal edema, likely secondary to extensive dental diseases.  She has since been treated by her dentist.  The CT also showed significant nasal septal deviation and bilateral inferior turbinate hypertrophy.  More than 95% of her nasal passageways are obstructed bilaterally.  The patient returns today complaining of persistent nasal obstruction.  Currently she denies any facial pain, fever, or visual change.  Exam: General: Communicates without difficulty, well nourished, no acute distress. Head: Normocephalic, no evidence injury, no tenderness, facial buttresses intact without stepoff. Face/sinus: No tenderness to palpation and percussion. Facial movement is normal and symmetric. Eyes: PERRL, EOMI. No scleral icterus, conjunctivae clear. Neuro: CN II exam reveals vision grossly intact.  No nystagmus at any point of gaze. Ears: Auricles well formed without lesions.  Ear canals are intact without mass or lesion.  No erythema or edema is appreciated.  The TMs are intact without fluid. Nose: External evaluation reveals normal support and skin without lesions.  Dorsum is intact.  Anterior rhinoscopy reveals congested mucosa over anterior aspect of inferior turbinates and deviated septum.  No purulence noted. Oral:  Oral cavity and oropharynx are intact, symmetric, without erythema or edema.  Mucosa is moist without  lesions. Neck: Full range of motion without pain.  There is no significant lymphadenopathy.  No masses palpable.  Thyroid  bed within normal limits to palpation.  Parotid glands and submandibular glands equal bilaterally without mass.  Trachea is midline. Neuro:  CN 2-12 grossly intact.   Assessment: 1.  Chronic nasal obstruction, secondary to nasal mucosal congestion, nasal septal deviation, and bilateral inferior turbinate hypertrophy.  More than 95% of her nasal passageways are obstructed bilaterally. 2.  Mild mucosal disease is noted within the floor of bilateral maxillary sinuses.  This may be secondary to her extensive dental diseases.  She is currently being treated by her dentist. 3.  Her nasal obstruction has not responded to medical treatment for the past year.  Plan: 1.  The physical exam findings and the CT images are extensively reviewed with the patient. 2.  Continue with Flonase and Zyrtec daily.  Nasal saline irrigation is also encouraged. 3.  Based on the above findings, the patient would benefit from surgical intervention with septoplasty and bilateral inferior turbinate reduction.  The risk, benefits, alternatives, and details of the procedures are extensively reviewed.  Questions are invited and answered. 4.  The patient would like to proceed with the procedures.  We will schedule the procedures in accordance with her schedule.

## 2023-12-29 ENCOUNTER — Telehealth: Payer: Self-pay

## 2023-12-29 ENCOUNTER — Ambulatory Visit (INDEPENDENT_AMBULATORY_CARE_PROVIDER_SITE_OTHER): Admitting: Physical Medicine and Rehabilitation

## 2023-12-29 ENCOUNTER — Other Ambulatory Visit: Payer: Self-pay

## 2023-12-29 VITALS — BP 117/81 | HR 76

## 2023-12-29 DIAGNOSIS — F411 Generalized anxiety disorder: Secondary | ICD-10-CM

## 2023-12-29 DIAGNOSIS — M47816 Spondylosis without myelopathy or radiculopathy, lumbar region: Secondary | ICD-10-CM

## 2023-12-29 MED ORDER — DIAZEPAM 5 MG PO TABS: ORAL_TABLET | ORAL | 0 refills | Status: DC

## 2023-12-29 NOTE — Telephone Encounter (Signed)
 Pre procedural Valium

## 2023-12-29 NOTE — Progress Notes (Unsigned)
 Pain Scale   Average Pain 7 Patient advising she has chronic lower back pain radiating to bilateral legs, pain increases when walking and standing, and decreases when sitting         +Driver, -BT, -Dye Allergies.

## 2024-01-01 NOTE — Progress Notes (Signed)
 Melissa Schmidt - 48 y.o. female MRN [id number]  Date of birth: 18-Jun-1975  Office Visit Note: Visit Date: 12/29/2023 PCP: Melissa Clem Pitts, MD Referred by: Melissa Schmidt, *  Subjective: Chief Complaint  Patient presents with   Lower Back - Pain   HPI:  Melissa Schmidt is a 48 y.o. female who comes in todayfor planned radiofrequency ablation of the Right L4-5 and L5-S1 Lumbar facet joints. This would be ablation of the corresponding medial branches and/or dorsal rami.  Patient has had double diagnostic blocks with more than 50% relief.  These are documented on pain diary.  They have had chronic back pain for quite some time, more than 3 months, which has been an ongoing situation with recalcitrant axial back pain.  They have no radicular pain.  Their axial pain is worse with standing and ambulating and on exam today with facet loading.  They have had physical therapy as well as home exercise program.  The imaging noted in the chart below indicated facet pathology. Accordingly they meet all the criteria and qualification for for radiofrequency ablation and we are going to complete this today hopefully for more longer term relief as part of comprehensive management program.    ROS Otherwise per HPI.  Assessment & Plan: Visit Diagnoses:    ICD-10-CM   1. Spondylosis without myelopathy or radiculopathy, lumbar region  M47.816 XR C-ARM NO REPORT    Radiofrequency,Lumbar    2. Pre-operative anxiety  F41.1 diazepam  (VALIUM ) 5 MG tablet      Plan: No additional findings.   Meds & Orders:  Meds ordered this encounter  Medications   diazepam  (VALIUM ) 5 MG tablet    Sig: Take 1 by mouth 1 hour  pre-procedure with very light food. Do not drive motor vehicle.    Dispense:  1 tablet    Refill:  0    Orders Placed This Encounter  Procedures   Radiofrequency,Lumbar   XR C-ARM NO REPORT    Follow-up: Return if symptoms worsen or fail to improve.   Procedures: No procedures performed   Lumbar Facet Joint Nerve Denervation  Patient: Melissa Schmidt      Date of Birth: 11/14/1975 MRN: [id number] PCP: Melissa Clem Pitts, MD      Visit Date: 12/29/2023   Universal Protocol:    Date/Time: 11/24/256:50 AM  Consent Given By: the patient  Position: PRONE  Additional Comments: Vital signs were monitored before and after the procedure. Patient was prepped and draped in the usual sterile fashion. The correct patient, procedure, and site was verified.   Injection Procedure Details:   Procedure diagnoses:  1. Spondylosis without myelopathy or radiculopathy, lumbar region   2. Pre-operative anxiety      Meds Administered:  Meds ordered this encounter  Medications   diazepam  (VALIUM ) 5 MG tablet    Sig: Take 1 by mouth 1 hour  pre-procedure with very light food. Do not drive motor vehicle.    Dispense:  1 tablet    Refill:  0     Laterality: Right  Location/Site:  L4-L5, L3 and L4 medial branches and L5-S1, L4 medial branch and L5 dorsal ramus  Needle: 18 ga.,  10mm active tip, RF Cannula  Needle Placement: Along juncture of superior articular process and transverse pocess  Findings:  -Comments:  Procedure Details: For each desired target nerve, the corresponding transverse process (sacral ala for the L5 dorsal rami) was identified and the fluoroscope was positioned to square  off the endplates of the corresponding vertebral body to achieve a true AP midline view.  The beam was then obliqued 15 to 20 degrees and caudally tilted 15 to 20 degrees to line up a trajectory along the target nerves. The skin over the target of the junction of superior articulating process and transverse process (sacral ala for the L5 dorsal rami) was infiltrated with 1ml of 1% Lidocaine  without Epinephrine.  The 18 gauge 10mm active tip outer cannula was advanced in trajectory view to the target.  This procedure was repeated for each target nerve.  Then, for all levels, the outer  cannula placement was fine-tuned and the position was then confirmed with bi-planar imaging.    Test stimulation was done both at sensory and motor levels to ensure there was no radicular stimulation. The target tissues were then infiltrated with 1 ml of 1% Lidocaine  without Epinephrine. Subsequently, a percutaneous neurotomy was carried out for 90 seconds at 80 degrees Celsius.  After the completion of the lesion, 1 ml of injectate was delivered. It was then repeated for each facet joint nerve mentioned above. Appropriate radiographs were obtained to verify the probe placement during the neurotomy.   Additional Comments:  The patient tolerated the procedure well Dressing: 2 x 2 sterile gauze and Band-Aid    Post-procedure details: Patient was observed during the procedure. Post-procedure instructions were reviewed.  Patient left the clinic in stable condition.      Clinical History: CLINICAL DATA:  Low back pain, symptoms persist with > 6 wks treatment   EXAM: MRI LUMBAR SPINE WITHOUT CONTRAST   TECHNIQUE: Multiplanar, multisequence MR imaging of the lumbar spine was performed. No intravenous contrast was administered.   COMPARISON:  None Available.   FINDINGS: Segmentation:  Standard.   Alignment:  Physiologic.   Vertebrae:  No fracture, evidence of discitis, or bone lesion.   Conus medullaris and cauda equina: Conus extends to the L1-L2 disc space level. Conus and cauda equina appear normal.   Paraspinal and other soft tissues: Negative.   Disc levels:   T12-L1: Unremarkable   L1-L2: Unremarkable   L2-L3: Unremarkable   L3-L4: Mild bilateral facet degenerative change. No spinal canal or neural foraminal narrowing.   L4-L5: Moderate to severe bilateral facet degenerative change. No spinal canal narrowing. No neural foraminal narrowing.   L5-S1: Moderate to severe bilateral facet degenerative change. No spinal canal narrowing. No neural foraminal  narrowing.   IMPRESSION: 1. Moderate to severe bilateral facet degenerative change at L4-L5 and L5-S1. 2. No spinal canal or neural foraminal narrowing.     Electronically Signed   By: [name] M.D.   On: 01/04/2023 17:18     Objective:  VS:  HT:    WT:   BMI:     BP:117/81  HR:76bpm  TEMP: ( )  RESP:  Physical Exam Vitals and nursing note reviewed.  Constitutional:      General: She is not in acute distress.    Appearance: Normal appearance. She is obese. She is not ill-appearing.  HENT:     Head: Normocephalic and atraumatic.     Right Ear: External ear normal.     Left Ear: External ear normal.  Eyes:     Extraocular Movements: Extraocular movements intact.  Cardiovascular:     Rate and Rhythm: Normal rate.     Pulses: Normal pulses.  Pulmonary:     Effort: Pulmonary effort is normal. No respiratory distress.  Abdominal:     General:  There is no distension.     Palpations: Abdomen is soft.  Musculoskeletal:        General: Tenderness present.     Cervical back: Neck supple.     Right lower leg: No edema.     Left lower leg: No edema.     Comments: Patient has good distal strength with no pain over the greater trochanters.  No clonus or focal weakness.  Skin:    Findings: No erythema, lesion or rash.  Neurological:     General: No focal deficit present.     Mental Status: She is alert and oriented to person, place, and time.     Sensory: No sensory deficit.     Motor: No weakness or abnormal muscle tone.     Coordination: Coordination normal.  Psychiatric:        Mood and Affect: Mood normal.        Behavior: Behavior normal.      Imaging: No results found.

## 2024-01-01 NOTE — Procedures (Signed)
 Lumbar Facet Joint Nerve Denervation  Patient: Melissa Schmidt      Date of Birth: 1976/01/17 MRN: [id number] PCP: Marchelle Clem Pitts, MD      Visit Date: 12/29/2023   Universal Protocol:    Date/Time: 11/24/256:50 AM  Consent Given By: the patient  Position: PRONE  Additional Comments: Vital signs were monitored before and after the procedure. Patient was prepped and draped in the usual sterile fashion. The correct patient, procedure, and site was verified.   Injection Procedure Details:   Procedure diagnoses:  1. Spondylosis without myelopathy or radiculopathy, lumbar region   2. Pre-operative anxiety      Meds Administered:  Meds ordered this encounter  Medications   diazepam  (VALIUM ) 5 MG tablet    Sig: Take 1 by mouth 1 hour  pre-procedure with very light food. Do not drive motor vehicle.    Dispense:  1 tablet    Refill:  0     Laterality: Right  Location/Site:  L4-L5, L3 and L4 medial branches and L5-S1, L4 medial branch and L5 dorsal ramus  Needle: 18 ga.,  10mm active tip, RF Cannula  Needle Placement: Along juncture of superior articular process and transverse pocess  Findings:  -Comments:  Procedure Details: For each desired target nerve, the corresponding transverse process (sacral ala for the L5 dorsal rami) was identified and the fluoroscope was positioned to square off the endplates of the corresponding vertebral body to achieve a true AP midline view.  The beam was then obliqued 15 to 20 degrees and caudally tilted 15 to 20 degrees to line up a trajectory along the target nerves. The skin over the target of the junction of superior articulating process and transverse process (sacral ala for the L5 dorsal rami) was infiltrated with 1ml of 1% Lidocaine  without Epinephrine.  The 18 gauge 10mm active tip outer cannula was advanced in trajectory view to the target.  This procedure was repeated for each target nerve.  Then, for all levels, the outer  cannula placement was fine-tuned and the position was then confirmed with bi-planar imaging.    Test stimulation was done both at sensory and motor levels to ensure there was no radicular stimulation. The target tissues were then infiltrated with 1 ml of 1% Lidocaine  without Epinephrine. Subsequently, a percutaneous neurotomy was carried out for 90 seconds at 80 degrees Celsius.  After the completion of the lesion, 1 ml of injectate was delivered. It was then repeated for each facet joint nerve mentioned above. Appropriate radiographs were obtained to verify the probe placement during the neurotomy.   Additional Comments:  The patient tolerated the procedure well Dressing: 2 x 2 sterile gauze and Band-Aid    Post-procedure details: Patient was observed during the procedure. Post-procedure instructions were reviewed.  Patient left the clinic in stable condition.

## 2024-01-09 ENCOUNTER — Ambulatory Visit: Admitting: Family

## 2024-01-09 ENCOUNTER — Ambulatory Visit (HOSPITAL_COMMUNITY)
Admission: RE | Admit: 2024-01-09 | Discharge: 2024-01-09 | Disposition: A | Discharge status: Home / Self Care | Attending: Internal Medicine | Admitting: Internal Medicine

## 2024-01-09 ENCOUNTER — Encounter (HOSPITAL_COMMUNITY)
Admission: RE | Disposition: A | Discharge status: Home / Self Care | Payer: Self-pay | Source: Home / Self Care | Attending: Internal Medicine

## 2024-01-09 DIAGNOSIS — D509 Iron deficiency anemia, unspecified: Secondary | ICD-10-CM | POA: Diagnosis present

## 2024-01-09 DIAGNOSIS — K3189 Other diseases of stomach and duodenum: Secondary | ICD-10-CM

## 2024-01-09 DIAGNOSIS — K900 Celiac disease: Secondary | ICD-10-CM

## 2024-01-09 HISTORY — PX: GIVENS CAPSULE STUDY: SHX5432

## 2024-01-09 SURGERY — IMAGING PROCEDURE, GI TRACT, INTRALUMINAL, VIA CAPSULE

## 2024-01-10 ENCOUNTER — Encounter (HOSPITAL_COMMUNITY): Payer: Self-pay | Admitting: Internal Medicine

## 2024-01-16 ENCOUNTER — Other Ambulatory Visit: Payer: Self-pay | Admitting: *Deleted

## 2024-01-16 ENCOUNTER — Ambulatory Visit: Admitting: Gastroenterology

## 2024-01-16 ENCOUNTER — Telehealth: Payer: Self-pay | Admitting: *Deleted

## 2024-01-16 ENCOUNTER — Encounter: Payer: Self-pay | Admitting: *Deleted

## 2024-01-16 ENCOUNTER — Ambulatory Visit: Admitting: Physical Medicine and Rehabilitation

## 2024-01-16 ENCOUNTER — Other Ambulatory Visit: Payer: Self-pay | Admitting: "Endocrinology

## 2024-01-16 ENCOUNTER — Other Ambulatory Visit: Payer: Self-pay

## 2024-01-16 VITALS — BP 121/84 | HR 80 | Temp 98.5°F | Ht 63.0 in | Wt 265.5 lb

## 2024-01-16 VITALS — BP 110/77 | HR 84

## 2024-01-16 DIAGNOSIS — M47816 Spondylosis without myelopathy or radiculopathy, lumbar region: Secondary | ICD-10-CM

## 2024-01-16 DIAGNOSIS — K900 Celiac disease: Secondary | ICD-10-CM

## 2024-01-16 DIAGNOSIS — D509 Iron deficiency anemia, unspecified: Secondary | ICD-10-CM | POA: Diagnosis not present

## 2024-01-16 DIAGNOSIS — D500 Iron deficiency anemia secondary to blood loss (chronic): Secondary | ICD-10-CM

## 2024-01-16 DIAGNOSIS — R634 Abnormal weight loss: Secondary | ICD-10-CM | POA: Diagnosis not present

## 2024-01-16 DIAGNOSIS — R1031 Right lower quadrant pain: Secondary | ICD-10-CM

## 2024-01-16 MED ORDER — METHYLPREDNISOLONE ACETATE 40 MG/ML IJ SUSP: 40.0000 mg | Freq: Once | INTRAMUSCULAR | Status: AC

## 2024-01-16 NOTE — Telephone Encounter (Signed)
 [place] APPROVED VIA [facility] Current Status: Approved Validity Period: 01/16/2024 - 03/16/2024 Authorization: [id number] (Abdomen and Pelvis CT)

## 2024-01-16 NOTE — Progress Notes (Signed)
 "   Gastroenterology Office Note     Primary Care Physician:  Marchelle Clem Pitts, MD  Primary Gastroenterologist: Dr. Shaaron    Chief Complaint   Chief Complaint  Patient presents with   Follow-up    Pt to have banding     History of Present Illness   Melissa Schmidt is a 48 y.o. female presenting today with a history of celiac disease with active disease recently on small bowel biopsies, abdominal pain, hepatic steatosis with elevated transaminases, chronic GERD, fatty liver with biopsy at time of cholecystectomy Oct 2024 with mildly active steatohepatitis (Grade 1 of 3), no fibrosis, chronic IDA. Due to symptomatic hemorrhoids, she has had left lateral X 2, right anterior, and right posterior banding. Here for possible neutral banding.   Celiac: DEXA Sept 2024 normal. Gluten-free. Taking calcium  and Vit D.   She notes RLQ pain when she swallowed the capsule. However, the capsule did reach the cecum. She has had unintentional weight loss. She is on Mounjaro  but states the weight is rapidly falling and concerning her. Intermittent abdominal pain, right sided abdominal pain. 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 She found out that her deodorant and body wash may have had traces of gluten, so she is now avoiding this. Prior gluten serology weak positive at 5.    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    colonoscopy May 2025: non-bleeding external and internal hemorrhoids, otherwise normal.   EGD Oct 2025: normal esophagus s/p dilation and biopsy, gastric polyps removed, duodenum with cobblestoning but 2nd and 3rd portions appeared normal, s/p biopsy. Path: patchy villous blunting, active celiac disease. Possible small bowel imaging needed.   Capsule 01/09/24 for IDA, active celiac disease, results still pending.   Past Medical History:  Diagnosis Date   Anxiety    Arthritis    Asthma    Borderline diabetes    Celiac disease    Complication of anesthesia     I have shaking all over when I am waking up  from anesthesia   Depression    Diabetes mellitus without complication (HCC)    Dyspnea    Fatty liver    GERD (gastroesophageal reflux disease)    Hemorrhoids    Hyperthyroidism    Hypertriglyceridemia    PCOS (polycystic ovarian syndrome)     Past Surgical History:  Procedure Laterality Date   APPENDECTOMY  2002   CHOLECYSTECTOMY  11/2022   COLONOSCOPY N/A 06/14/2023   Procedure: COLONOSCOPY;  Surgeon: Shaaron Lamar HERO, MD;  Location: [facility];  Service: Endoscopy;  Laterality: N/A;  815am, asa 3   COLONOSCOPY WITH PROPOFOL  N/A 05/25/2015   Dr.Rourk- non-bleeding hemorrhoids, the examination was o/w normal   ESOPHAGEAL DILATION N/A 05/25/2015   Procedure: ESOPHAGEAL DILATION;  Surgeon: Lamar HERO Shaaron, MD;  Location: [facility];  Service: Endoscopy;  Laterality: N/A;   ESOPHAGOGASTRODUODENOSCOPY  04/2017   [place]: EGD with irregular Z-line s/p biopsy, benign-appearing esophageal stenosis s/p dilation, normal examined duodenum but no biopsies of duodenum. Pathology with junctional type GE mucosa with chronic inflammation and surface erosion, no metaplasia, negative dysplasia.    ESOPHAGOGASTRODUODENOSCOPY N/A 12/01/2023   Procedure: EGD (ESOPHAGOGASTRODUODENOSCOPY);  Surgeon: Shaaron Lamar HERO, MD;  [facility]: [facility];  Service: Endoscopy;  Laterality: N/A;  9:15 AM, ASA 3   ESOPHAGOGASTRODUODENOSCOPY (EGD) WITH PROPOFOL  N/A 05/25/2015   Dr.Rourk- normal esophagus, dilated, gastric erosions, intact fundoplication, normal third portion of the duodenum, non-bleeding erosive gastropathy. duodenum bx= benign small bowel mucosa with patchy increased  intraepithelial lymphocytes, mild villous blunting and mild crypt hyperplasia, no dysplasia or malignancy stomach bx= erosive gastritis with reactive changes   GIVENS CAPSULE STUDY N/A 01/09/2024   Procedure: IMAGING PROCEDURE, GI TRACT, INTRALUMINAL, VIA CAPSULE;  Surgeon: Shaaron Lamar HERO, MD;  Location: [facility];  Service:  Endoscopy;  Laterality: N/A;  730am   LIVER BIOPSY N/A 11/11/2022   Procedure: LIVER BIOPSY;  Surgeon: Kallie Manuelita BROCKS, MD;  Location: [facility];  Service: General;  Laterality: N/A;   NISSEN FUNDOPLICATION  2005    Current Outpatient Medications  Medication Sig Dispense Refill   albuterol  (PROAIR  HFA) 108 (90 Base) MCG/ACT inhaler 2 puffs every 4 hours as needed only  if your can't catch your breath 18 g 11   Ascorbic Acid (VITAMIN C ADULT GUMMIES PO) Take 2 tablets by mouth daily.     Blood Glucose Monitoring Suppl (ACCU-CHEK GUIDE ME) w/Device KIT Use to check blood glucose four times daily as directed. 1 kit 0   budesonide -formoterol  (SYMBICORT ) 80-4.5 MCG/ACT inhaler Take 2 puffs first thing in am and then another 2 puffs about 12 hours later. 1 each 12   Calcium  Carb-Cholecalciferol (CALCIUM  1000 + D PO) Take 1 tablet by mouth daily.     Cholecalciferol (VITAMIN D3) 125 MCG (5000 UT) CAPS Take 1 capsule (5,000 Units total) by mouth daily. 90 capsule 0   Continuous Glucose Receiver (FREESTYLE LIBRE 3 READER) DEVI 1 Piece by Does not apply route once as needed for up to 1 dose. 1 each 0   Continuous Glucose Sensor (FREESTYLE LIBRE 3 PLUS SENSOR) MISC Change sensor every 15 days. 2 each 2   diazepam  (VALIUM ) 5 MG tablet Take 1 by mouth 1 hour  pre-procedure with very light food. Do not drive motor vehicle. 1 tablet 0   ferrous sulfate 325 (65 FE) MG EC tablet Take 325 mg by mouth daily with breakfast.     insulin  glargine (LANTUS  SOLOSTAR) 100 UNIT/ML Solostar Pen Inject 20 Units into the skin at bedtime. 15 mL 1   levothyroxine  (SYNTHROID ) 200 MCG tablet TAKE 1 TABLET BY MOUTH ONCE DAILY BEFORE BREAKFAST 90 tablet 0   losartan (COZAAR) 50 MG tablet Take 1 tablet by mouth daily.     metFORMIN (GLUCOPHAGE) 1000 MG tablet Take 1,000 mg by mouth 2 (two) times daily with a meal.      metroNIDAZOLE (METROCREAM) 0.75 % cream Apply 1 Application topically 2 (two) times daily. (Patient taking  differently: Apply 1 Application topically 2 (two) times daily.)     Multiple Vitamin (MULTIVITAMIN) capsule Take 1 capsule by mouth daily.     pantoprazole  (PROTONIX ) 40 MG tablet Take 1 tablet (40 mg total) by mouth 2 (two) times daily before a meal. 60 tablet 3   PARoxetine (PAXIL) 40 MG tablet Take 40 mg by mouth every morning.     propranolol  (INDERAL ) 20 MG tablet Take 10 mg by mouth every 6 (six) hours as needed (anxiety).     rosuvastatin  (CRESTOR ) 10 MG tablet Take 10 mg by mouth at bedtime.     SUMAtriptan (IMITREX) 100 MG tablet Take 100 mg by mouth every 2 (two) hours as needed.     tirzepatide  (MOUNJARO ) 5 MG/0.5ML Pen Inject 5 mg into the skin once a week. 6 mL 0   topiramate (TOPAMAX) 25 MG tablet Take 100 mg by mouth daily.     valACYclovir (VALTREX) 1000 MG tablet Take 1,000 mg by mouth.     No current facility-administered  medications for this visit.    Allergies as of 01/16/2024 - Review Complete 01/16/2024  Allergen Reaction Noted   Montelukast sodium Dermatitis 09/20/2019   Doxycycline Rash 10/25/2023   Singulair [montelukast] Rash 05/31/2019    Family History  Problem Relation Age of Onset   Irritable bowel syndrome Mother    Colon polyps Mother        ? 5 year surveillance   Inflammatory bowel disease Neg Hx    Colon cancer Neg Hx    Liver disease Neg Hx     Social History   Socioeconomic History   Marital status: Married    Spouse name: Not on file   Number of children: 1   Years of education: Not on file   Highest education level: Not on file  Occupational History   Occupation: in school  Tobacco Use   Smoking status: Never   Smokeless tobacco: Never  Vaping Use   Vaping status: Never Used  Substance and Sexual Activity   Alcohol use: No    Alcohol/week: 0.0 standard drinks of alcohol   Drug use: No   Sexual activity: Yes    Birth control/protection: None  Other Topics Concern   Not on file  Social History Narrative   Not on file    Social Drivers of Health   Financial Resource Strain: Not on file  Food Insecurity: Not on file  Transportation Needs: Not on file  Physical Activity: Not on file  Stress: Not on file  Social Connections: Not on file  Intimate Partner Violence: Not on file     Review of Systems   See HPI   Physical Exam   BP 121/84   Pulse 80   Temp 98.5 F (36.9 C)   Ht 5' 3 (1.6 m)   Wt 265 lb 8 oz (120.4 kg)   LMP  (LMP Unknown)   BMI 47.03 kg/m  General:   Alert and oriented. Pleasant and cooperative. Well-nourished and well-developed.  Head:  Normocephalic and atraumatic. Eyes:  Without icterus Abdomen:  +BS, soft, non-tender and non-distended. No HSM noted. No guarding or rebound. No masses appreciated.  Msk:  Symmetrical without gross deformities. Normal posture. Extremities:  Without edema. Neurologic:  Alert and  oriented x4;  grossly normal neurologically. Skin:  Intact without significant lesions or rashes. Psych:  Alert and cooperative. Normal mood and affect.    [facility] Banding The patient presents with symptomatic grade 2-3 hemorrhoids, unresponsive to maximal medical therapy, requesting rubber band ligation of her hemorrhoidal disease. All risks, benefits, and alternative forms of therapy were described and informed consent was obtained.   The decision was made to band theneutrally, and the [facility] was used to perform band ligation without complication. Digital anorectal examination was then performed to assure proper positioning of the band, and to adjust the banded tissue as required. The patient was discharged home without pain or other issues. Dietary and behavioral recommendations were given, along with follow-up instructions.     Assessment   Active celiac disease in setting of inadvertent exposure  IDA, likely due to uncontrolled celiac disease. Colonoscopy/EGD on file  RLQ abdominal pain  Unintentional weight loss   PLAN   Update labs to  include celiac serologies, inflammatory markers, alpha gal, iron studies, CMP, Vit D, TSH  Avoid gluten exposure  Capsule study results pending: need to rule out any occult lymphoma, erosions, ulcerations, ?IBD  Stat CT due to RLQ abdominal pain, weight loss  Close follow-up  in 2-3 weeks  May need more dedicated small bowel imaging (CTE vs MRE). Await capsule results first  Referral to Hematology    Therisa [name], PhD, ANP-BC [facility]astroenter[facility]    "

## 2024-01-16 NOTE — Patient Instructions (Addendum)
 Please have blood work done today.  I have ordered the CT scan to be done as soon as possible. You will need to hold metformin the day of the scan and for 72 hours following this.   We will need to do a pregnancy screen prior as well.  Further recommendations to follow, and I will see you back in 2-3 weeks!  I enjoyed seeing you again today! I value our relationship and want to provide genuine, compassionate, and quality care. You may receive a survey regarding your visit with me, and I welcome your feedback! Thanks so much for taking the time to complete this. I look forward to seeing you again.      Therisa [name], PhD, ANP-BC [facility] Gastroenterology

## 2024-01-16 NOTE — Progress Notes (Signed)
 Pain Scale   Average Pain 6 Patient advising she had chronic lower back pain that is constant.         +Driver, -BT, -Dye Allergies.

## 2024-01-17 LAB — PREGNANCY, URINE: Preg Test, Ur: NEGATIVE

## 2024-01-19 ENCOUNTER — Ambulatory Visit: Admitting: Family

## 2024-01-19 DIAGNOSIS — M76829 Posterior tibial tendinitis, unspecified leg: Secondary | ICD-10-CM | POA: Diagnosis not present

## 2024-01-19 DIAGNOSIS — M25571 Pain in right ankle and joints of right foot: Secondary | ICD-10-CM

## 2024-01-19 LAB — CBC WITH DIFFERENTIAL/PLATELET
Basophils Absolute: 0 x10E3/uL (ref 0.0–0.2)
Basos: 0 %
EOS (ABSOLUTE): 0.2 x10E3/uL (ref 0.0–0.4)
Eos: 2 %
Hematocrit: 41.5 % (ref 34.0–46.6)
Hemoglobin: 13 g/dL (ref 11.1–15.9)
Immature Grans (Abs): 0 x10E3/uL (ref 0.0–0.1)
Immature Granulocytes: 0 %
Lymphocytes Absolute: 2.6 x10E3/uL (ref 0.7–3.1)
Lymphs: 23 %
MCH: 25.4 pg — ABNORMAL LOW (ref 26.6–33.0)
MCHC: 31.3 g/dL — ABNORMAL LOW (ref 31.5–35.7)
MCV: 81 fL (ref 79–97)
Monocytes Absolute: 0.7 x10E3/uL (ref 0.1–0.9)
Monocytes: 6 %
Neutrophils Absolute: 7.8 x10E3/uL — ABNORMAL HIGH (ref 1.4–7.0)
Neutrophils: 69 %
Platelets: 380 x10E3/uL (ref 150–450)
RBC: 5.12 x10E6/uL (ref 3.77–5.28)
RDW: 14.2 % (ref 11.7–15.4)
WBC: 11.4 x10E3/uL — ABNORMAL HIGH (ref 3.4–10.8)

## 2024-01-19 LAB — COMPREHENSIVE METABOLIC PANEL WITH GFR
ALT: 30 IU/L (ref 0–32)
AST: 30 IU/L (ref 0–40)
Albumin: 4.4 g/dL (ref 3.9–4.9)
Alkaline Phosphatase: 80 IU/L (ref 41–116)
BUN/Creatinine Ratio: 24 — ABNORMAL HIGH (ref 9–23)
BUN: 17 mg/dL (ref 6–24)
Bilirubin Total: 0.2 mg/dL (ref 0.0–1.2)
CO2: 26 mmol/L (ref 20–29)
Calcium: 10.3 mg/dL — ABNORMAL HIGH (ref 8.7–10.2)
Chloride: 99 mmol/L (ref 96–106)
Creatinine, Ser: 0.71 mg/dL (ref 0.57–1.00)
Globulin, Total: 2.8 g/dL (ref 1.5–4.5)
Glucose: 82 mg/dL (ref 70–99)
Potassium: 4.3 mmol/L (ref 3.5–5.2)
Sodium: 138 mmol/L (ref 134–144)
Total Protein: 7.2 g/dL (ref 6.0–8.5)
eGFR: 106 mL/min/1.73 (ref >59)

## 2024-01-19 LAB — SEDIMENTATION RATE: Sed Rate: 26 mm/h (ref 0–32)

## 2024-01-19 LAB — ALPHA-GAL PANEL
Allergen Lamb IgE: <0.1 kU/L
Beef IgE: <0.1 kU/L
IgE (Immunoglobulin E), Serum: 100 [IU]/mL (ref 6–495)
O215-IgE Alpha-Gal: <0.1 kU/L
Pork IgE: <0.1 kU/L

## 2024-01-19 LAB — IRON,TIBC AND FERRITIN PANEL
Ferritin: 125 ng/mL (ref 15–150)
Iron Saturation: 13 % — ABNORMAL LOW (ref 15–55)
Iron: 35 ug/dL (ref 27–159)
Total Iron Binding Capacity: 280 ug/dL (ref 250–450)
UIBC: 245 ug/dL (ref 131–425)

## 2024-01-19 LAB — C-REACTIVE PROTEIN: CRP: 17 mg/L — ABNORMAL HIGH (ref 0–10)

## 2024-01-19 LAB — VITAMIN D 25 HYDROXY (VIT D DEFICIENCY, FRACTURES): Vit D, 25-Hydroxy: 56.7 ng/mL (ref 30.0–100.0)

## 2024-01-19 LAB — TISSUE TRANSGLUTAMINASE, IGA: t-Transglutaminase (tTG) IgA: 3 U/mL (ref 0–3)

## 2024-01-21 ENCOUNTER — Ambulatory Visit: Payer: Self-pay | Admitting: Gastroenterology

## 2024-01-22 ENCOUNTER — Ambulatory Visit (HOSPITAL_COMMUNITY): Admission: RE | Admit: 2024-01-22 | Discharge: 2024-01-22 | Attending: Gastroenterology | Admitting: Gastroenterology

## 2024-01-22 DIAGNOSIS — R1031 Right lower quadrant pain: Secondary | ICD-10-CM | POA: Insufficient documentation

## 2024-01-22 MED ORDER — IOHEXOL 9 MG/ML PO SOLN
ORAL | Status: AC
  Filled 2024-01-22: qty 1000

## 2024-01-22 MED ORDER — IOHEXOL 9 MG/ML PO SOLN
500.0000 mL | ORAL | Status: AC
  Administered 2024-01-22: 15:00:00 500 mL via ORAL

## 2024-01-22 MED ORDER — IOHEXOL 300 MG/ML  SOLN
100.0000 mL | Freq: Once | INTRAMUSCULAR | Status: AC | PRN
  Administered 2024-01-22: 15:00:00 100 mL via INTRAVENOUS

## 2024-01-22 NOTE — Telephone Encounter (Signed)
 Please refer to Hematology with [facility]. Dx: IDA, chronic leukocytosis.

## 2024-01-23 ENCOUNTER — Ambulatory Visit: Admitting: Internal Medicine

## 2024-01-23 ENCOUNTER — Encounter: Payer: Self-pay | Admitting: Internal Medicine

## 2024-01-23 VITALS — BP 103/68 | HR 92 | Ht 63.0 in | Wt 258.0 lb

## 2024-01-23 DIAGNOSIS — R0609 Other forms of dyspnea: Secondary | ICD-10-CM

## 2024-01-23 DIAGNOSIS — R058 Other specified cough: Secondary | ICD-10-CM

## 2024-01-23 NOTE — Assessment & Plan Note (Addendum)
 Onset with COVID 2020 with baseline wt 298  - initial pulmonary eval 05/02/2023 wt 306 limited by knees > sob  - 05/02/2023  After extensive coaching inhaler device,  effectiveness =    50% vs baseline < 25%  :  rec trial of symbicort  80 2bid and max gerd rx> improved 01/23/2024  Allergy screen 07/24/2023 >  Eos 03. /  IgE  29  All goals of chronic asthma control met including optimal function and elimination of symptoms with minimal need for rescue therapy.  Re SABA :  I spent extra time with pt today reviewing appropriate use of albuterol  for prn use on exertion with the following points: 1) saba is for relief of sob that does not improve by walking a slower pace or resting but rather if the pt does not improve after trying this first. 2) If the pt is convinced, as many are, that saba helps recover from activity faster then it's easy to tell if this is the case by re-challenging : ie stop, take the inhaler, then p 5 minutes try the exact same activity (intensity of workload) that just caused the symptoms and see if they are substantially diminished or not after saba 3) if there is an activity that reproducibly causes the symptoms, try the saba 15 min before the activity on alternate days   If in fact the saba really does help, then fine to continue to use it prn but advised may need to look closer at the maintenance regimen being used to achieve better control of airways disease with exertion.      F/u q 6 m, sooner prn     Each maintenance medication was reviewed in detail including emphasizing most importantly the difference between maintenance and prns and under what circumstances the prns are to be triggered using an action plan format where appropriate.  Total time for H and P, chart review, counseling, reviewing hfa  device(s) and generating customized AVS unique to this office visit / same day charting = 

## 2024-01-23 NOTE — Progress Notes (Signed)
 Melissa Schmidt, female    DOB: 11-15-1975    MRN: [id number]   Brief patient profile:  68  yowf  never smoker with seasonal rhinitis teenager self referred to pulmonary clinic in [facility]  05/02/2023 for ? AB ?  Ever since covid  2020   Wt 281 pre covid 2020   History of Present Illness  05/02/2023  Pulmonary/ 1st Schmidt eval/ Melissa Schmidt / [place] Schmidt wt 306  Chief Complaint  Patient presents with   Consult    Having trouble breathing SOB   Dyspnea:  limited by R knee/ steps a big problem  Cough: harsh dry cough  Sleep: flat bed / props up with 2 pillows helps breathing and cough  SABA use: couple times a week seems to help cough but baseline hfa technique < 25 % effective 02: none  Rec Plan A = Automatic = Always=    Symbicort  80 (Breyna  80 ) Take 2 puffs first thing in am and then another 2 puffs about 12 hours later.   Work on inhaler technique:   Plan B = Backup (to supplement plan A, not to replace it) Only use your Proaire  (albuterol  inhaler) as a rescue medication  Pantoprazole  (protonix ) 40 mg   Take  30-60 min before first meal of the day and Pepcid  (famotidine )  20 mg after supper until return to Schmidt -  GERD diet reviewed, bed blocks rec  Please schedule a follow up Schmidt visit in 6 weeks, call sooner if needed with all medications /inhalers/ solutions in hand   07/24/2023  f/u ov/Melissa Schmidt/Melissa Schmidt re: cough  maint on symbicort  80 did not bring meds  Chief Complaint  Patient presents with   Shortness of Breath    Green mucus when coughing which started today   Follow-up  Dyspnea:  improving  Cough: afternoons / trace green mucus  Sleeping: bed is flat 1-2 pillows s resp cc  SABA use: less  02: none  Rec No change in medications Work on inhaler technique: >>>  Remember how golfers warm up by taking practice swings - do this with an empty inhaler   If not doing well can use prednisone  x 6 days as a backup as we talked about today, but only x 6 days with  limited refills s ov      01/23/2024  f/u ov/Melissa Schmidt/Melissa Schmidt re: cough /doe p covid  maint on symbicort  80  no need for prednisone   Chief Complaint  Patient presents with   Cough    F/u Med refills  Dyspnea:  limited by R knee > sob  Cough: variable min mucoid  Sleeping: flat bed  2 pillows due to neck problem SABA use: rarely  02: none    No obvious day to day or daytime variability or assoc excess/ purulent sputum or mucus plugs or hemoptysis or cp or chest tightness, subjective wheeze or overt sinus  symptoms.    Also denies any obvious fluctuation of symptoms with weather or environmental changes or other aggravating or alleviating factors except as outlined above   No unusual exposure hx or h/o childhood pna/ asthma or knowledge of premature birth.  Current Allergies, Complete Past Medical History, Past Surgical History, Family History, and Social History were reviewed in [employer] record.  ROS  The following are not active complaints unless bolded Hoarseness, sore throat, dysphagia, dental problems, itching, sneezing,  nasal congestion or discharge of excess mucus or purulent secretions, ear ache,  fever, chills, sweats, unintended wt loss or wt gain, classically pleuritic or exertional cp,  orthopnea pnd or arm/hand swelling  or leg swelling, presyncope, palpitations, abdominal pain, anorexia, nausea, vomiting, diarrhea  or change in bowel habits or change in bladder habits, change in stools or change in urine, dysuria, hematuria,  rash, arthralgias, visual complaints, headache, numbness, weakness or ataxia or problems with walking or coordination,  change in mood or  memory.         Outpatient Medications Prior to Visit  Medication Sig Dispense Refill   albuterol  (PROAIR  HFA) 108 (90 Base) MCG/ACT inhaler 2 puffs every 4 hours as needed only  if your can't catch your breath 18 g 11   Ascorbic Acid (VITAMIN C ADULT GUMMIES PO) Take 2 tablets by  mouth daily.     Blood Glucose Monitoring Suppl (ACCU-CHEK GUIDE ME) w/Device KIT Use to check blood glucose four times daily as directed. 1 kit 0   budesonide -formoterol  (SYMBICORT ) 80-4.5 MCG/ACT inhaler Take 2 puffs first thing in am and then another 2 puffs about 12 hours later. 1 each 12   Calcium  Carb-Cholecalciferol (CALCIUM  1000 + D PO) Take 1 tablet by mouth daily.     Cholecalciferol (VITAMIN D3) 125 MCG (5000 UT) CAPS Take 1 capsule (5,000 Units total) by mouth daily. 90 capsule 0   Continuous Glucose Receiver (FREESTYLE LIBRE 3 READER) DEVI 1 Piece by Does not apply route once as needed for up to 1 dose. 1 each 0   Continuous Glucose Sensor (FREESTYLE LIBRE 3 PLUS SENSOR) MISC CHANGE SENSOR EVERY 15 DAYS 6 each 0   ferrous sulfate 325 (65 FE) MG EC tablet Take 325 mg by mouth daily with breakfast.     insulin  glargine (LANTUS  SOLOSTAR) 100 UNIT/ML Solostar Pen Inject 20 Units into the skin at bedtime. 15 mL 1   levothyroxine  (SYNTHROID ) 200 MCG tablet TAKE 1 TABLET BY MOUTH ONCE DAILY BEFORE BREAKFAST 90 tablet 0   losartan (COZAAR) 50 MG tablet Take 1 tablet by mouth daily.     metFORMIN (GLUCOPHAGE) 1000 MG tablet Take 1,000 mg by mouth 2 (two) times daily with a meal.      metroNIDAZOLE (METROCREAM) 0.75 % cream Apply 1 Application topically 2 (two) times daily.     Multiple Vitamin (MULTIVITAMIN) capsule Take 1 capsule by mouth daily.     pantoprazole  (PROTONIX ) 40 MG tablet Take 1 tablet (40 mg total) by mouth 2 (two) times daily before a meal. 60 tablet 3   PARoxetine (PAXIL) 40 MG tablet Take 40 mg by mouth every morning.     propranolol  (INDERAL ) 20 MG tablet Take 10 mg by mouth every 6 (six) hours as needed (anxiety).     rosuvastatin  (CRESTOR ) 10 MG tablet Take 10 mg by mouth at bedtime.     SUMAtriptan (IMITREX) 100 MG tablet Take 100 mg by mouth every 2 (two) hours as needed.     tirzepatide  (MOUNJARO ) 5 MG/0.5ML Pen Inject 5 mg into the skin once a week. 6 mL 0    topiramate (TOPAMAX) 25 MG tablet Take 100 mg by mouth daily.     valACYclovir (VALTREX) 1000 MG tablet Take 1,000 mg by mouth.     diazepam  (VALIUM ) 5 MG tablet Take 1 by mouth 1 hour  pre-procedure with very light food. Do not drive motor vehicle. (Patient not taking: Reported on 01/23/2024) 1 tablet 0   Facility-Administered Medications Prior to Visit  Medication Dose Route Frequency Provider Last Rate Last Admin  methylPREDNISolone  acetate (DEPO-MEDROL ) injection 40 mg  40 mg Other Once              Past Medical History:  Diagnosis Date   Arthritis    Borderline diabetes    Celiac disease    Complication of anesthesia    shaking post anesthesia   Diabetes mellitus without complication (HCC)    Dyspnea    Hemorrhoids    Hyperthyroidism    Hypertriglyceridemia    PCOS (polycystic ovarian syndrome)       Objective:    Wts  01/23/2024         258   07/24/23 (!) 307 lb (139.3 kg)  07/20/23 (!) 304 lb 6.4 oz (138.1 kg)  06/14/23 (!) 309 lb (140.2 kg)     Vital signs reviewed  01/23/2024  - Note at rest 02 sats  94% on RA   General appearance:   MO (by bmi pleasant wf nad    HEENT : Oropharynx  clear      Nasal turbinates nl    NECK :  without  apparent JVD/ palpable Nodes/TM    LUNGS: no acc muscle use,  Nl contour chest which is clear to A and P bilaterally without cough on insp or exp maneuvers   CV:  RRR  no s3 or murmur or increase in P2, and no edema   ABD:  soft and nontender   MS:  Gait nl   ext warm without deformities Or obvious joint restrictions  calf tenderness, cyanosis or clubbing    SKIN: warm and dry without lesions    NEURO:  alert, approp, nl sensorium with  no motor or cerebellar deficits apparent.        Assessment     Assessment & Plan Upper airway cough syndrome Onset rhinitis in childhood - Allergy screen 07/24/2023 >  Eos 0.3 /  IgE  29  Marked improvement with gerd rx and wt loss, no additional pulmonary w/u needed   DOE  (dyspnea on exertion) Onset with COVID 2020 with baseline wt 298  - initial pulmonary eval 05/02/2023 wt 306 limited by knees > sob  - 05/02/2023  After extensive coaching inhaler device,  effectiveness =    50% vs baseline < 25%  :  rec trial of symbicort  80 2bid and max gerd rx> improved 01/23/2024  Allergy screen 07/24/2023 >  Eos 03. /  IgE  29  All goals of chronic asthma control met including optimal function and elimination of symptoms with minimal need for rescue therapy.  Re SABA :  I spent extra time with pt today reviewing appropriate use of albuterol  for prn use on exertion with the following points: 1) saba is for relief of sob that does not improve by walking a slower pace or resting but rather if the pt does not improve after trying this first. 2) If the pt is convinced, as many are, that saba helps recover from activity faster then it's easy to tell if this is the case by re-challenging : ie stop, take the inhaler, then p 5 minutes try the exact same activity (intensity of workload) that just caused the symptoms and see if they are substantially diminished or not after saba 3) if there is an activity that reproducibly causes the symptoms, try the saba 15 min before the activity on alternate days   If in fact the saba really does help, then fine to continue to use it prn but advised may need to  look closer at the maintenance regimen being used to achieve better control of airways disease with exertion.      F/u q 6 m, sooner prn     Each maintenance medication was reviewed in detail including emphasizing most importantly the difference between maintenance and prns and under what circumstances the prns are to be triggered using an action plan format where appropriate.  Total time for H and P, chart review, counseling, reviewing hfa  device(s) and generating customized AVS unique to this Schmidt visit / same day charting =         AVS  Patient Instructions  No change  symbicort  80 Take 2 puffs first thing in am and then another 2 puffs about 12 hours later.    Use your albuterol  as a rescue medication to be used if you can't catch your breath by resting, slowing your pace,  or doing a relaxed purse lip breathing pattern.  - The less you use it, the better it will work when you need it. - Ok to use up to 2 puffs  every 4 hours if you must but call for  appointment if use goes up over your usual need - Don't leave home without it !!  (think of it like a spare tire or starter fluid for your car)   Also  Ok to try albuterol  15 min before an activity (on alternating days)  that you know would usually make you short of breath and see if it makes any difference and if makes none then don't take albuterol  after activity unless you can't catch your breath as this means it's the resting that helps, not the albuterol .  Please schedule a follow up visit in 6 months but call sooner if needed        Ozell America, MD 01/23/2024

## 2024-01-23 NOTE — Assessment & Plan Note (Addendum)
 Onset rhinitis in childhood - Allergy screen 07/24/2023 >  Eos 0.3 /  IgE  29  Marked improvement with gerd rx and wt loss, no additional pulmonary w/u needed

## 2024-01-23 NOTE — Patient Instructions (Addendum)
 No change symbicort  80 Take 2 puffs first thing in am and then another 2 puffs about 12 hours later.    Use your albuterol  as a rescue medication to be used if you can't catch your breath by resting, slowing your pace,  or doing a relaxed purse lip breathing pattern.  - The less you use it, the better it will work when you need it. - Ok to use up to 2 puffs  every 4 hours if you must but call for  appointment if use goes up over your usual need - Don't leave home without it !!  (think of it like a spare tire or starter fluid for your car)   Also  Ok to try albuterol  15 min before an activity (on alternating days)  that you know would usually make you short of breath and see if it makes any difference and if makes none then don't take albuterol  after activity unless you can't catch your breath as this means it's the resting that helps, not the albuterol .  Please schedule a follow up visit in 6 months but call sooner if needed

## 2024-01-23 NOTE — Progress Notes (Signed)
 Melissa Schmidt - 48 y.o. female MRN [id number]  Date of birth: 06-19-1975  Office Visit Note: Visit Date: 01/16/2024 PCP: Marchelle Clem Pitts, MD Referred by: Marchelle Clem Pitts, MD  Subjective: Chief Complaint  Patient presents with   Lower Back - Pain   HPI:  Melissa Schmidt is a 48 y.o. female who comes in todayfor planned radiofrequency ablation of the Left L4-5 and L5-S1 Lumbar facet joints. This would be ablation of the corresponding medial branches and/or dorsal rami.  Patient has had double diagnostic blocks with more than 50% relief.  These are documented on pain diary.  They have had chronic back pain for quite some time, more than 3 months, which has been an ongoing situation with recalcitrant axial back pain.  They have no radicular pain.  Their axial pain is worse with standing and ambulating and on exam today with facet loading.  They have had physical therapy as well as home exercise program.  The imaging noted in the chart below indicated facet pathology. Accordingly they meet all the criteria and qualification for for radiofrequency ablation and we are going to complete this today hopefully for more longer term relief as part of comprehensive management program.   ROS Otherwise per HPI.  Assessment & Plan: Visit Diagnoses:    ICD-10-CM   1. Spondylosis without myelopathy or radiculopathy, lumbar region  M47.816 XR C-ARM NO REPORT    Radiofrequency,Lumbar    methylPREDNISolone  acetate (DEPO-MEDROL ) injection 40 mg      Plan: No additional findings.   Meds & Orders:  Meds ordered this encounter  Medications   methylPREDNISolone  acetate (DEPO-MEDROL ) injection 40 mg    Orders Placed This Encounter  Procedures   Radiofrequency,Lumbar   XR C-ARM NO REPORT    Follow-up: Return for visit to requesting provider as needed.   Procedures: No procedures performed  Lumbar Facet Joint Nerve Denervation  Patient: Melissa Schmidt      Date of Birth: [birth year] MRN:  [id number] PCP: Marchelle Clem Pitts, MD      Visit Date: 01/16/2024   Universal Protocol:    Date/Time: 12/16/20255:15 AM  Consent Given By: the patient  Position: PRONE  Additional Comments: Vital signs were monitored before and after the procedure. Patient was prepped and draped in the usual sterile fashion. The correct patient, procedure, and site was verified.   Injection Procedure Details:   Procedure diagnoses:  1. Spondylosis without myelopathy or radiculopathy, lumbar region      Meds Administered:  Meds ordered this encounter  Medications   methylPREDNISolone  acetate (DEPO-MEDROL ) injection 40 mg     Laterality: Left  Location/Site:  L4-L5, L3 and L4 medial branches and L5-S1, L4 medial branch and L5 dorsal ramus  Needle: 18 ga.,  10mm active tip, RF Cannula  Needle Placement: Along juncture of superior articular process and transverse pocess  Findings:  -Comments:  Procedure Details: For each desired target nerve, the corresponding transverse process (sacral ala for the L5 dorsal rami) was identified and the fluoroscope was positioned to square off the endplates of the corresponding vertebral body to achieve a true AP midline view.  The beam was then obliqued 15 to 20 degrees and caudally tilted 15 to 20 degrees to line up a trajectory along the target nerves. The skin over the target of the junction of superior articulating process and transverse process (sacral ala for the L5 dorsal rami) was infiltrated with 1ml of 1% Lidocaine  without Epinephrine.  The 18 gauge  10mm active tip outer cannula was advanced in trajectory view to the target.  This procedure was repeated for each target nerve.  Then, for all levels, the outer cannula placement was fine-tuned and the position was then confirmed with bi-planar imaging.    Test stimulation was done both at sensory and motor levels to ensure there was no radicular stimulation. The target tissues were then  infiltrated with 1 ml of 1% Lidocaine  without Epinephrine. Subsequently, a percutaneous neurotomy was carried out for 90 seconds at 80 degrees Celsius.  After the completion of the lesion, 1 ml of injectate was delivered. It was then repeated for each facet joint nerve mentioned above. Appropriate radiographs were obtained to verify the probe placement during the neurotomy.   Additional Comments:  The patient tolerated the procedure well Dressing: 2 x 2 sterile gauze and Band-Aid    Post-procedure details: Patient was observed during the procedure. Post-procedure instructions were reviewed.  Patient left the clinic in stable condition.      Clinical History: CLINICAL DATA:  Low back pain, symptoms persist with > 6 wks treatment   EXAM: MRI LUMBAR SPINE WITHOUT CONTRAST   TECHNIQUE: Multiplanar, multisequence MR imaging of the lumbar spine was performed. No intravenous contrast was administered.   COMPARISON:  None Available.   FINDINGS: Segmentation:  Standard.   Alignment:  Physiologic.   Vertebrae:  No fracture, evidence of discitis, or bone lesion.   Conus medullaris and cauda equina: Conus extends to the L1-L2 disc space level. Conus and cauda equina appear normal.   Paraspinal and other soft tissues: Negative.   Disc levels:   T12-L1: Unremarkable   L1-L2: Unremarkable   L2-L3: Unremarkable   L3-L4: Mild bilateral facet degenerative change. No spinal canal or neural foraminal narrowing.   L4-L5: Moderate to severe bilateral facet degenerative change. No spinal canal narrowing. No neural foraminal narrowing.   L5-S1: Moderate to severe bilateral facet degenerative change. No spinal canal narrowing. No neural foraminal narrowing.   IMPRESSION: 1. Moderate to severe bilateral facet degenerative change at L4-L5 and L5-S1. 2. No spinal canal or neural foraminal narrowing.     Electronically Signed   By: [name] M.D.   On: 01/04/2023 17:18      Objective:  VS:  HT:    WT:   BMI:     BP:110/77  HR:84bpm  TEMP: ( )  RESP:  Physical Exam Vitals and nursing note reviewed.  Constitutional:      General: She is not in acute distress.    Appearance: Normal appearance. She is obese. She is not ill-appearing.  HENT:     Head: Normocephalic and atraumatic.     Right Ear: External ear normal.     Left Ear: External ear normal.  Eyes:     Extraocular Movements: Extraocular movements intact.  Cardiovascular:     Rate and Rhythm: Normal rate.     Pulses: Normal pulses.  Pulmonary:     Effort: Pulmonary effort is normal. No respiratory distress.  Abdominal:     General: There is no distension.     Palpations: Abdomen is soft.  Musculoskeletal:        General: Tenderness present.     Cervical back: Neck supple.     Right lower leg: No edema.     Left lower leg: No edema.     Comments: Patient has good distal strength with no pain over the greater trochanters.  No clonus or focal weakness.  Skin:  Findings: No erythema, lesion or rash.  Neurological:     General: No focal deficit present.     Mental Status: She is alert and oriented to person, place, and time.     Sensory: No sensory deficit.     Motor: No weakness or abnormal muscle tone.     Coordination: Coordination normal.  Psychiatric:        Mood and Affect: Mood normal.        Behavior: Behavior normal.      Imaging: CT ABDOMEN PELVIS W CONTRAST Result Date: 01/22/2024 CLINICAL DATA:  Right lower quadrant abdominal pain with unintended weight loss. EXAM: CT ABDOMEN AND PELVIS WITH CONTRAST TECHNIQUE: Multidetector CT imaging of the abdomen and pelvis was performed using the standard protocol following bolus administration of intravenous contrast. RADIATION DOSE REDUCTION: This exam was performed according to the departmental dose-optimization program which includes automated exposure control, adjustment of the mA and/or kV according to patient size and/or  use of iterative reconstruction technique. CONTRAST:  OMNIPAQUE  IOHEXOL  300 MG/ML  SOLN COMPARISON:  09/08/2022 FINDINGS: Lower chest: No acute abnormality. Hepatobiliary: Previously noted hepatic steatosis appears less prominent on the current study. No evidence of cirrhosis or hepatic mass. The gallbladder is surgically absent. No biliary ductal dilatation. Pancreas: Unremarkable. No pancreatic ductal dilatation or surrounding inflammatory changes. Spleen: Normal in size without focal abnormality. Adrenals/Urinary Tract: Adrenal glands are unremarkable. Kidneys are normal, without renal calculi, focal lesion, or hydronephrosis. Bladder is unremarkable. Stomach/Bowel: Bowel shows no evidence of obstruction, ileus, inflammation or lesion. The appendix is normal. No free intraperitoneal air. Vascular/Lymphatic: No significant vascular findings are present. No enlarged abdominal or pelvic lymph nodes. Reproductive: Uterus and bilateral adnexa are unremarkable. Normally positioned IUD in the endometrial cavity. Other: No abdominal wall hernia or abnormality. No abdominopelvic ascites. Musculoskeletal: No acute or significant osseous findings. IMPRESSION: 1. No acute findings in the abdomen or pelvis. 2. Previously noted hepatic steatosis appears less prominent on the current study. Electronically Signed   By: Marcey Moan M.D.   On: 01/22/2024 16:21

## 2024-01-23 NOTE — Procedures (Signed)
 Lumbar Facet Joint Nerve Denervation  Patient: Melissa Schmidt      Date of Birth: 06-17-1975 MRN: [id number] PCP: Marchelle Clem Pitts, MD      Visit Date: 01/16/2024   Universal Protocol:    Date/Time: 12/16/20255:15 AM  Consent Given By: the patient  Position: PRONE  Additional Comments: Vital signs were monitored before and after the procedure. Patient was prepped and draped in the usual sterile fashion. The correct patient, procedure, and site was verified.   Injection Procedure Details:   Procedure diagnoses:  1. Spondylosis without myelopathy or radiculopathy, lumbar region      Meds Administered:  Meds ordered this encounter  Medications   methylPREDNISolone  acetate (DEPO-MEDROL ) injection 40 mg     Laterality: Left  Location/Site:  L4-L5, L3 and L4 medial branches and L5-S1, L4 medial branch and L5 dorsal ramus  Needle: 18 ga.,  10mm active tip, RF Cannula  Needle Placement: Along juncture of superior articular process and transverse pocess  Findings:  -Comments:  Procedure Details: For each desired target nerve, the corresponding transverse process (sacral ala for the L5 dorsal rami) was identified and the fluoroscope was positioned to square off the endplates of the corresponding vertebral body to achieve a true AP midline view.  The beam was then obliqued 15 to 20 degrees and caudally tilted 15 to 20 degrees to line up a trajectory along the target nerves. The skin over the target of the junction of superior articulating process and transverse process (sacral ala for the L5 dorsal rami) was infiltrated with 1ml of 1% Lidocaine  without Epinephrine.  The 18 gauge 10mm active tip outer cannula was advanced in trajectory view to the target.  This procedure was repeated for each target nerve.  Then, for all levels, the outer cannula placement was fine-tuned and the position was then confirmed with bi-planar imaging.    Test stimulation was done both at sensory  and motor levels to ensure there was no radicular stimulation. The target tissues were then infiltrated with 1 ml of 1% Lidocaine  without Epinephrine. Subsequently, a percutaneous neurotomy was carried out for 90 seconds at 80 degrees Celsius.  After the completion of the lesion, 1 ml of injectate was delivered. It was then repeated for each facet joint nerve mentioned above. Appropriate radiographs were obtained to verify the probe placement during the neurotomy.   Additional Comments:  The patient tolerated the procedure well Dressing: 2 x 2 sterile gauze and Band-Aid    Post-procedure details: Patient was observed during the procedure. Post-procedure instructions were reviewed.  Patient left the clinic in stable condition.

## 2024-01-24 NOTE — Progress Notes (Unsigned)
 Office Visit Note   Patient: Melissa Schmidt           Date of Birth: [birth year]           MRN: [id number] Visit Date: 01/19/2024              Requested by: Marchelle Clem Pitts, MD [address] [place],  [place] [id number] PCP: Marchelle Clem Pitts, MD  Chief Complaint  Patient presents with   Right Ankle - Follow-up      HPI:   Assessment & Plan: Visit Diagnoses: No diagnosis found.  Plan: ***  Follow-Up Instructions: No follow-ups on file.   Ortho Exam  Patient is alert, oriented, no adenopathy, well-dressed, normal affect, normal respiratory effort. ***    Imaging: No results found. No images are attached to the encounter.  Labs: Lab Results  Component Value Date   HGBA1C 7.8 (A) 10/25/2023   HGBA1C 11.4 07/19/2023   HGBA1C 7.6 (H) 11/09/2022   ESRSEDRATE 26 01/16/2024   CRP 17 (H) 01/16/2024     Lab Results  Component Value Date   ALBUMIN 4.4 01/16/2024   ALBUMIN 4.0 11/09/2023   ALBUMIN 4.0 07/07/2023    No results found for: MG Lab Results  Component Value Date   VD25OH 56.7 01/16/2024   VD25OH 23.9 (L) 07/07/2023   VD25OH 19.0 (L) 07/05/2023    No results found for: PREALBUMIN    Latest Ref Rng & Units 01/16/2024    1:06 PM 11/09/2023    4:17 PM 07/24/2023    3:36 PM  CBC EXTENDED  WBC 3.4 - 10.8 x10E3/uL 11.4  12.2  10.8   RBC 3.77 - 5.28 x10E6/uL 5.12  4.88  4.98   Hemoglobin 11.1 - 15.9 g/dL 86.9  87.4  86.9   HCT 34.0 - 46.6 % 41.5  39.2  40.5   Platelets 150 - 450 x10E3/uL 380  386  310   NEUT# 1.4 - 7.0 x10E3/uL 7.8  8.6  7.2   Lymph# 0.7 - 3.1 x10E3/uL 2.6  2.7  2.6      There is no height or weight on file to calculate BMI.  Orders:  No orders of the defined types were placed in this encounter.  No orders of the defined types were placed in this encounter.    Procedures: No procedures performed  Clinical Data: No additional findings.  ROS:  All other systems negative, except as noted in the HPI. Review  of Systems  Objective: Vital Signs: LMP  (LMP Unknown)   Specialty Comments:  CLINICAL DATA:  Low back pain, symptoms persist with > 6 wks treatment   EXAM: MRI LUMBAR SPINE WITHOUT CONTRAST   TECHNIQUE: Multiplanar, multisequence MR imaging of the lumbar spine was performed. No intravenous contrast was administered.   COMPARISON:  None Available.   FINDINGS: Segmentation:  Standard.   Alignment:  Physiologic.   Vertebrae:  No fracture, evidence of discitis, or bone lesion.   Conus medullaris and cauda equina: Conus extends to the L1-L2 disc space level. Conus and cauda equina appear normal.   Paraspinal and other soft tissues: Negative.   Disc levels:   T12-L1: Unremarkable   L1-L2: Unremarkable   L2-L3: Unremarkable   L3-L4: Mild bilateral facet degenerative change. No spinal canal or neural foraminal narrowing.   L4-L5: Moderate to severe bilateral facet degenerative change. No spinal canal narrowing. No neural foraminal narrowing.   L5-S1: Moderate to severe bilateral facet degenerative change. No spinal canal narrowing. No neural  foraminal narrowing.   IMPRESSION: 1. Moderate to severe bilateral facet degenerative change at L4-L5 and L5-S1. 2. No spinal canal or neural foraminal narrowing.     Electronically Signed   By: [name] M.D.   On: 01/04/2023 17:18  PMFS History: Patient Active Problem List   Diagnosis Date Noted   Loss of weight 01/16/2024   Chronic maxillary sinusitis 12/27/2023   Impacted cerumen of right ear 11/23/2023   Right-sided tinnitus 11/23/2023   Conductive hearing loss in right ear 11/23/2023   Deviated nasal septum 11/23/2023   Hypertrophy of nasal turbinates 11/23/2023   Chronic pansinusitis 11/23/2023   Upper airway cough syndrome 07/24/2023   Metabolic dysfunction-associated steatohepatitis (MASH) 05/11/2023   DOE (dyspnea on exertion) 05/02/2023   Sinus tachycardia 12/01/2022   Chronic  cholecystitis 11/07/2022   Mixed hyperlipidemia 05/16/2022   Morbid obesity (HCC) 04/06/2021   IDA (iron deficiency anemia) 08/20/2020   Vitamin D  deficiency 08/20/2020   Type 2 diabetes mellitus without complication, without long-term current use of insulin  (HCC) 07/21/2020   Iron deficiency anemia 12/31/2019   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   Hemorrhoids 12/11/2018   Graves' disease 11/27/2018   RUQ abdominal pain 10/10/2018   Celiac disease 10/10/2018   GERD (gastroesophageal reflux disease) 10/10/2018   Hyperthyroidism 10/10/2017   Uncontrolled type 2 diabetes mellitus with hyperglycemia (HCC) 10/10/2017   Mucosal abnormality of stomach    Dysphagia    Positive autoantibody screening for celiac disease 04/29/2015   Fatty liver 03/19/2015   Colicky RUQ abdominal pain 03/19/2015   RLQ abdominal pain 03/19/2015   Rectal bleeding 03/19/2015   Past Medical History:  Diagnosis Date   Anxiety    Arthritis    Asthma    Borderline diabetes    Celiac disease    Complication of anesthesia     I have shaking all over when I am waking up from anesthesia   Depression    Diabetes mellitus without complication (HCC)    Dyspnea    Fatty liver    GERD (gastroesophageal reflux disease)    Hemorrhoids    Hyperthyroidism    Hypertriglyceridemia    PCOS (polycystic ovarian syndrome)     Family History  Problem Relation Age of Onset   Irritable bowel syndrome Mother    Colon polyps Mother        ? 5 year surveillance   Inflammatory bowel disease Neg Hx    Colon cancer Neg Hx    Liver disease Neg Hx     Past Surgical History:  Procedure Laterality Date   APPENDECTOMY  2002   CHOLECYSTECTOMY  11/2022   COLONOSCOPY N/A 06/14/2023   Procedure: COLONOSCOPY;  Surgeon: Shaaron Lamar HERO, MD;  Location: [facility];  Service: Endoscopy;  Laterality: N/A;  815am, asa 3   COLONOSCOPY WITH PROPOFOL  N/A 05/25/2015    Dr.Rourk- non-bleeding hemorrhoids, the examination was o/w normal   ESOPHAGEAL DILATION N/A 05/25/2015   Procedure: ESOPHAGEAL DILATION;  Surgeon: Lamar HERO Shaaron, MD;  Location: [facility];  Service: Endoscopy;  Laterality: N/A;   ESOPHAGOGASTRODUODENOSCOPY  04/2017   [place]: EGD with irregular Z-line s/p biopsy, benign-appearing esophageal stenosis s/p dilation, normal examined duodenum but no biopsies of duodenum. Pathology with junctional type GE mucosa with chronic inflammation and surface erosion, no metaplasia, negative dysplasia.    ESOPHAGOGASTRODUODENOSCOPY N/A 12/01/2023   Procedure: EGD (ESOPHAGOGASTRODUODENOSCOPY);  Surgeon: Shaaron Lamar HERO, MD;  Location: [facility];  Service: Endoscopy;  Laterality: N/A;  9:15  AM, ASA 3   ESOPHAGOGASTRODUODENOSCOPY (EGD) WITH PROPOFOL  N/A 05/25/2015   Dr.Rourk- normal esophagus, dilated, gastric erosions, intact fundoplication, normal third portion of the duodenum, non-bleeding erosive gastropathy. duodenum bx= benign small bowel mucosa with patchy increased intraepithelial lymphocytes, mild villous blunting and mild crypt hyperplasia, no dysplasia or malignancy stomach bx= erosive gastritis with reactive changes   GIVENS CAPSULE STUDY N/A 01/09/2024   Procedure: IMAGING PROCEDURE, GI TRACT, INTRALUMINAL, VIA CAPSULE;  Surgeon: Shaaron Lamar HERO, MD;  Location: [facility];  Service: Endoscopy;  Laterality: N/A;  730am   LIVER BIOPSY N/A 11/11/2022   Procedure: LIVER BIOPSY;  Surgeon: Kallie Manuelita BROCKS, MD;  Location: [facility];  Service: General;  Laterality: N/A;   NISSEN FUNDOPLICATION  2005   Social History   Occupational History   Occupation: in school  Tobacco Use   Smoking status: Never   Smokeless tobacco: Never  Vaping Use   Vaping status: Never Used  Substance and Sexual Activity   Alcohol use: No    Alcohol/week: 0.0 standard drinks of alcohol   Drug use: No   Sexual activity: Yes    Birth control/protection:  None

## 2024-01-24 NOTE — Telephone Encounter (Signed)
Referral to Hematology done

## 2024-01-25 NOTE — Addendum Note (Signed)
 Addended by: SHIRLEAN THERISA ORN on: 01/25/2024 05:20 PM   Modules accepted: Orders

## 2024-01-26 LAB — COMPREHENSIVE METABOLIC PANEL WITH GFR
ALT: 29 IU/L (ref 0–32)
AST: 25 IU/L (ref 0–40)
Albumin: 4.2 g/dL (ref 3.9–4.9)
Alkaline Phosphatase: 85 IU/L (ref 41–116)
BUN/Creatinine Ratio: 20 (ref 9–23)
BUN: 16 mg/dL (ref 6–24)
Bilirubin Total: 0.3 mg/dL (ref 0.0–1.2)
CO2: 25 mmol/L (ref 20–29)
Calcium: 9.7 mg/dL (ref 8.7–10.2)
Chloride: 103 mmol/L (ref 96–106)
Creatinine, Ser: 0.79 mg/dL (ref 0.57–1.00)
Globulin, Total: 2.4 g/dL (ref 1.5–4.5)
Glucose: 109 mg/dL — ABNORMAL HIGH (ref 70–99)
Potassium: 4.6 mmol/L (ref 3.5–5.2)
Sodium: 140 mmol/L (ref 134–144)
Total Protein: 6.6 g/dL (ref 6.0–8.5)
eGFR: 93 mL/min/1.73

## 2024-01-26 LAB — T4, FREE: Free T4: 2.34 ng/dL — ABNORMAL HIGH (ref 0.82–1.77)

## 2024-01-26 LAB — TSH: TSH: <0.005 u[IU]/mL — ABNORMAL LOW (ref 0.450–4.500)

## 2024-01-26 LAB — LIPID PANEL
Chol/HDL Ratio: 2.8 ratio (ref 0.0–4.4)
Cholesterol, Total: 121 mg/dL (ref 100–199)
HDL: 43 mg/dL
LDL Chol Calc (NIH): 57 mg/dL (ref 0–99)
Triglycerides: 113 mg/dL (ref 0–149)
VLDL Cholesterol Cal: 21 mg/dL (ref 5–40)

## 2024-01-28 ENCOUNTER — Encounter: Payer: Self-pay | Admitting: Family

## 2024-01-29 LAB — IBD EXPANDED PANEL
ACCA: 45 U (ref 0–90)
ALCA: 89 U — ABNORMAL HIGH (ref 0–60)
AMCA: 32 U (ref 0–100)
Atypical pANCA: NEGATIVE
gASCA: 16 U (ref 0–50)

## 2024-01-30 ENCOUNTER — Ambulatory Visit (INDEPENDENT_AMBULATORY_CARE_PROVIDER_SITE_OTHER): Admitting: "Endocrinology

## 2024-01-30 ENCOUNTER — Encounter: Payer: Self-pay | Admitting: "Endocrinology

## 2024-01-30 ENCOUNTER — Encounter: Admitting: Nutrition

## 2024-01-30 VITALS — BP 101/68 | HR 76 | Resp 18 | Ht 63.0 in | Wt 258.8 lb

## 2024-01-30 VITALS — Ht 63.0 in | Wt 258.0 lb

## 2024-01-30 DIAGNOSIS — E890 Postprocedural hypothyroidism: Secondary | ICD-10-CM | POA: Diagnosis not present

## 2024-01-30 DIAGNOSIS — E119 Type 2 diabetes mellitus without complications: Secondary | ICD-10-CM | POA: Diagnosis not present

## 2024-01-30 DIAGNOSIS — Z7985 Long-term (current) use of injectable non-insulin antidiabetic drugs: Secondary | ICD-10-CM

## 2024-01-30 DIAGNOSIS — Z7984 Long term (current) use of oral hypoglycemic drugs: Secondary | ICD-10-CM | POA: Diagnosis not present

## 2024-01-30 DIAGNOSIS — E559 Vitamin D deficiency, unspecified: Secondary | ICD-10-CM | POA: Diagnosis not present

## 2024-01-30 DIAGNOSIS — E1165 Type 2 diabetes mellitus with hyperglycemia: Secondary | ICD-10-CM | POA: Diagnosis present

## 2024-01-30 DIAGNOSIS — E782 Mixed hyperlipidemia: Secondary | ICD-10-CM

## 2024-01-30 LAB — POCT GLYCOSYLATED HEMOGLOBIN (HGB A1C): Hemoglobin A1C: 6.1 % — AB (ref 4.0–5.6)

## 2024-01-30 MED ORDER — LEVOTHYROXINE SODIUM 150 MCG PO TABS: 150.0000 ug | ORAL_TABLET | Freq: Every day | ORAL | 0 refills | Status: AC

## 2024-01-30 MED ORDER — FREESTYLE LIBRE 3 PLUS SENSOR MISC: 0 refills | Status: AC

## 2024-01-30 NOTE — Progress Notes (Signed)
 Medical Nutrition Therapy  Appointment Start time: 1515  Appointment End time:  1600  Primary concerns today: Type 2 Dm  Referral diagnosis: E11.8 Preferred learning style: No Preference  Learning readiness: Ready    NUTRITION ASSESSMENT Dm Follow up. I am engaged now and am working hard to make my blood sugars better and improve my health. Stopped Lantus  today per Dr. Lenis. Is on Metformin 1000 mg BID. Is taking Mounjaro  and tolerating well.. Has lost 7 lbs in the last month. Is avoiding gluten food and trying to eat more fruits, vegetables and lean protein. Seeing GI for Celiac and possible Crohns disease. A1C 6.1% down from 7.8%- Libre. Drinkings water , unsweet tea  Lost 7 lbs Has Libre CGM. TIR 99%. 1% low. GMI 5.7%. Avg glucose 99  mg/dl.    Wt Readings from Last 3 Encounters:  01/30/24 258 lb 12.8 oz (117.4 kg)  01/23/24 258 lb (117 kg)  01/16/24 265 lb 8 oz (120.4 kg)   Ht Readings from Last 3 Encounters:  01/30/24 5' 3 (1.6 m)  01/23/24 5' 3 (1.6 m)  01/16/24 5' 3 (1.6 m)   There is no height or weight on file to calculate BMI. @BMIFA @ Facility age limit for growth %iles is 20 years. Facility age limit for growth %iles is 20 years.       Lost 8 lbs. Drinking more water  and unsweet tea. Cut out juices and processed foods and sweet tea. Eating healthier foods, watching portions and timing of meals. Feels better. Less bloated. She is engaged and making excellent progress. Sprained ankle (?) is feeling better and going to get PT to help it. Libre CMG on. TIR 96% !!!!! Amazing improvement.  4% high. No hypoglycemia. Tolerating Lantus  30 units at nigh, Metformin 1000 mg BID,  GMI 6.3%.  Clinical Medical Hx:  Past Medical History:  Diagnosis Date   Anxiety    Arthritis    Asthma    Borderline diabetes    Celiac disease    Complication of anesthesia     I have shaking all over when I am waking up from anesthesia   Depression    Diabetes mellitus  without complication (HCC)    Dyspnea    Fatty liver    GERD (gastroesophageal reflux disease)    Hemorrhoids    Hyperthyroidism    Hypertriglyceridemia    PCOS (polycystic ovarian syndrome)    Medications:  Current Outpatient Medications on File Prior to Visit  Medication Sig Dispense Refill   albuterol  (PROAIR  HFA) 108 (90 Base) MCG/ACT inhaler 2 puffs every 4 hours as needed only  if your can't catch your breath 18 g 11   Ascorbic Acid (VITAMIN C ADULT GUMMIES PO) Take 2 tablets by mouth daily.     Blood Glucose Monitoring Suppl (ACCU-CHEK GUIDE ME) w/Device KIT Use to check blood glucose four times daily as directed. 1 kit 0   budesonide -formoterol  (SYMBICORT ) 80-4.5 MCG/ACT inhaler Take 2 puffs first thing in am and then another 2 puffs about 12 hours later. 1 each 12   Calcium  Carb-Cholecalciferol (CALCIUM  1000 + D PO) Take 1 tablet by mouth daily.     Cholecalciferol (VITAMIN D3) 125 MCG (5000 UT) CAPS Take 1 capsule (5,000 Units total) by mouth daily. 90 capsule 0   Continuous Glucose Receiver (FREESTYLE LIBRE 3 READER) DEVI 1 Piece by Does not apply route once as needed for up to 1 dose. 1 each 0   Continuous Glucose Sensor (FREESTYLE LIBRE 3 PLUS SENSOR)  MISC Change sensor every 15 days. 6 each 0   diazepam  (VALIUM ) 5 MG tablet Take 1 by mouth 1 hour  pre-procedure with very light food. Do not drive motor vehicle. (Patient not taking: Reported on 01/23/2024) 1 tablet 0   ferrous sulfate 325 (65 FE) MG EC tablet Take 325 mg by mouth daily with breakfast.     levothyroxine  (SYNTHROID ) 150 MCG tablet Take 1 tablet (150 mcg total) by mouth daily before breakfast. 90 tablet 0   losartan (COZAAR) 50 MG tablet Take 1 tablet by mouth daily.     metFORMIN (GLUCOPHAGE) 1000 MG tablet Take 1,000 mg by mouth 2 (two) times daily with a meal.      metroNIDAZOLE (METROCREAM) 0.75 % cream Apply 1 Application topically 2 (two) times daily.     Multiple Vitamin (MULTIVITAMIN) capsule Take 1 capsule  by mouth daily.     pantoprazole  (PROTONIX ) 40 MG tablet Take 1 tablet (40 mg total) by mouth 2 (two) times daily before a meal. 60 tablet 3   PARoxetine (PAXIL) 40 MG tablet Take 40 mg by mouth every morning.     propranolol  (INDERAL ) 20 MG tablet Take 10 mg by mouth every 6 (six) hours as needed (anxiety).     rosuvastatin  (CRESTOR ) 10 MG tablet Take 10 mg by mouth at bedtime.     SUMAtriptan (IMITREX) 100 MG tablet Take 100 mg by mouth every 2 (two) hours as needed.     tirzepatide  (MOUNJARO ) 5 MG/0.5ML Pen Inject 5 mg into the skin once a week. 6 mL 0   topiramate (TOPAMAX) 25 MG tablet Take 100 mg by mouth daily.     valACYclovir (VALTREX) 1000 MG tablet Take 1,000 mg by mouth.     Current Facility-Administered Medications on File Prior to Visit  Medication Dose Route Frequency Provider Last Rate Last Admin   methylPREDNISolone  acetate (DEPO-MEDROL ) injection 40 mg  40 mg Other Once         Labs:  Lab Results  Component Value Date   HGBA1C 6.1 (A) 01/30/2024      Latest Ref Rng & Units 01/25/2024    9:22 AM 01/16/2024    1:06 PM 11/09/2023    4:17 PM  CMP  Glucose 70 - 99 mg/dL 890  82  867   BUN 6 - 24 mg/dL 16  17  11    Creatinine 0.57 - 1.00 mg/dL 9.20  9.28  9.26   Sodium 134 - 144 mmol/L 140  138  138   Potassium 3.5 - 5.2 mmol/L 4.6  4.3  4.4   Chloride 96 - 106 mmol/L 103  99  101   CO2 20 - 29 mmol/L 25  26  23    Calcium  8.7 - 10.2 mg/dL 9.7  89.6  9.4   Total Protein 6.0 - 8.5 g/dL 6.6  7.2  6.7   Total Bilirubin 0.0 - 1.2 mg/dL 0.3  0.2  0.4   Alkaline Phos 41 - 116 IU/L 85  80  80   AST 0 - 40 IU/L 25  30  24    ALT 0 - 32 IU/L 29  30  23     Lipid Panel     Component Value Date/Time   CHOL 121 01/25/2024 0922   TRIG 113 01/25/2024 0922   HDL 43 01/25/2024 0922   CHOLHDL 2.8 01/25/2024 0922   LDLCALC 57 01/25/2024 0922   LABVLDL 21 01/25/2024 0922    Notable Signs/Symptoms: Increased thirst, fatigue,   Lifestyle &  Dietary Hx Lives with her  husband. Not working  Estimated daily fluid intake: 80-100 oz Supplements: Vit C, Vit D,  Sleep: 7-8 Stress / self-care: None Current average weekly physical activity: ADL hurt her ankle  24-Hr Dietary Recall First Meal: Egg whites, GF Bread or oatmeal and fruit Lunch: chicken, baked beans, water  Dinner chicken, sweet potatoes, peas , green beans, water  Fruits strawberries and banana and grapes   Beverages: water   Estimated Energy Needs Calories: 1200 Carbohydrate: 135g Protein: 90g Fat: 33g   NUTRITION DIAGNOSIS  NI-1.7 Predicted excessive energy intake As related to high calorie high carb diet.  As evidenced by A1C 11.4%, BMI 54.   NUTRITION INTERVENTION  Nutrition education (E-1) on the following topics:  Nutrition and Diabetes education provided on My Plate, CHO counting, meal planning, portion sizes, timing of meals, avoiding snacks between meals unless having a low blood sugar, target ranges for A1C and blood sugars, signs/symptoms and treatment of hyper/hypoglycemia, monitoring blood sugars, taking medications as prescribed, benefits of exercising 30 minutes per day and prevention of complications of DM.  Lifestyle Medicine  - Whole Food, Plant Predominant Nutrition is highly recommended: Eat Plenty of vegetables, Mushrooms, fruits, Legumes, Whole Grains, Nuts, seeds in lieu of processed meats, processed snacks/pastries red meat, poultry, eggs.    -It is better to avoid simple carbohydrates including: Cakes, Sweet Desserts, Ice Cream, Soda (diet and regular), Sweet Tea, Candies, Chips, Cookies, Store Bought Juices, Alcohol in Excess of  1-2 drinks a day, Lemonade,  Artificial Sweeteners, Doughnuts, Coffee Creamers, Sugar-free Products, etc, etc.  This is not a complete list.....  Exercise: If you are able: 30 -60 minutes a day ,4 days a week, or 150 minutes a week.  The longer the better.  Combine stretch, strength, and aerobic activities.  If you were told in the past  that you have high risk for cardiovascular diseases, you may seek evaluation by your heart doctor prior to initiating moderate to intense exercise programs.   Handouts Provided Include  Lifestyle Medicine handouts S/s of hyper/hypoglycemia BS log  Learning Style & Readiness for Change Teaching method utilized: Visual & Auditory  Demonstrated degree of understanding via: Teach Back  Barriers to learning/adherence to lifestyle change: depression  Goals Established by Pt Goals Keep up the fantastic job!!! Lose 2-3 lbs per month Focus on whole plant based foods. A gallon of water  per day Walk 30 minutes Aim for 25 grams of fiber per day.SABRA   MONITORING & EVALUATION Dietary intake, weekly physical activity, and blood sugars  in 3 month.  Next Steps  Patient is to work on meal planning and meal prepping.SABRA

## 2024-01-30 NOTE — Progress Notes (Signed)
 "    01/30/2024           Endocrinology follow-up note  Subjective:    Patient ID: Melissa Schmidt, female    DOB: 1975/05/17, PCP Marchelle Clem Pitts, MD.   Past Medical History:  Diagnosis Date   Anxiety    Arthritis    Asthma    Borderline diabetes    Celiac disease    Complication of anesthesia     I have shaking all over when I am waking up from anesthesia   Depression    Diabetes mellitus without complication (HCC)    Dyspnea    Fatty liver    GERD (gastroesophageal reflux disease)    Hemorrhoids    Hyperthyroidism    Hypertriglyceridemia    PCOS (polycystic ovarian syndrome)    Past Surgical History:  Procedure Laterality Date   APPENDECTOMY  2002   CHOLECYSTECTOMY  11/2022   COLONOSCOPY N/A 06/14/2023   Procedure: COLONOSCOPY;  Surgeon: Shaaron Lamar HERO, MD;  Location: [facility];  Service: Endoscopy;  Laterality: N/A;  815am, asa 3   COLONOSCOPY WITH PROPOFOL  N/A 05/25/2015   Dr.Rourk- non-bleeding hemorrhoids, the examination was o/w normal   ESOPHAGEAL DILATION N/A 05/25/2015   Procedure: ESOPHAGEAL DILATION;  Surgeon: Lamar HERO Shaaron, MD;  Location: [facility];  Service: Endoscopy;  Laterality: N/A;   ESOPHAGOGASTRODUODENOSCOPY  04/2017   [place]: EGD with irregular Z-line s/p biopsy, benign-appearing esophageal stenosis s/p dilation, normal examined duodenum but no biopsies of duodenum. Pathology with junctional type GE mucosa with chronic inflammation and surface erosion, no metaplasia, negative dysplasia.    ESOPHAGOGASTRODUODENOSCOPY N/A 12/01/2023   Procedure: EGD (ESOPHAGOGASTRODUODENOSCOPY);  Surgeon: Shaaron Lamar HERO, MD;  [facility];  Service: Endoscopy;  Laterality: N/A;  9:15 AM, ASA 3   ESOPHAGOGASTRODUODENOSCOPY (EGD) WITH PROPOFOL  N/A 05/25/2015   Dr.Rourk- normal esophagus, dilated, gastric erosions, intact fundoplication, normal third portion of the duodenum, non-bleeding erosive gastropathy. duodenum bx= benign small bowel  mucosa with patchy increased intraepithelial lymphocytes, mild villous blunting and mild crypt hyperplasia, no dysplasia or malignancy stomach bx= erosive gastritis with reactive changes   GIVENS CAPSULE STUDY N/A 01/09/2024   Procedure: IMAGING PROCEDURE, GI TRACT, INTRALUMINAL, VIA CAPSULE;  Surgeon: Shaaron Lamar HERO, MD;  [facility];  Service: Endoscopy;  Laterality: N/A;  730am   LIVER BIOPSY N/A 11/11/2022   Procedure: LIVER BIOPSY;  Surgeon: Kallie Manuelita BROCKS, MD;  Location: [facility];  Service: General;  Laterality: N/A;   NISSEN FUNDOPLICATION  2005   Social History   Socioeconomic History   Marital status: Married    Spouse name: Not on file   Number of children: 1   Years of education: Not on file   Highest education level: Not on file  Occupational History   Occupation: in school  Tobacco Use   Smoking status: Never   Smokeless tobacco: Never  Vaping Use   Vaping status: Never Used  Substance and Sexual Activity   Alcohol use: No    Alcohol/week: 0.0 standard drinks of alcohol   Drug use: No   Sexual activity: Yes    Birth control/protection: None  Other Topics Concern   Not on file  Social History Narrative   Not on file   Social Drivers of Health   Tobacco Use: Low Risk (01/30/2024)   Patient History    Smoking Tobacco Use: Never    Smokeless Tobacco Use: Never    Passive Exposure: Not on file  Financial  Resource Strain: Not on file  Food Insecurity: Not on file  Transportation Needs: Not on file  Physical Activity: Not on file  Stress: Not on file  Social Connections: Not on file  Depression (PHQ2-9): Low Risk (09/04/2023)   Depression (PHQ2-9)    PHQ-2 Score: 3  Alcohol Screen: Not on file  Housing: Not on file  Utilities: Not on file  Health Literacy: Not on file   Outpatient Encounter Medications as of 01/30/2024  Medication Sig   albuterol  (PROAIR  HFA) 108 (90 Base) MCG/ACT inhaler 2 puffs every 4 hours as needed only  if your can't  catch your breath   Ascorbic Acid (VITAMIN C ADULT GUMMIES PO) Take 2 tablets by mouth daily.   Blood Glucose Monitoring Suppl (ACCU-CHEK GUIDE ME) w/Device KIT Use to check blood glucose four times daily as directed.   budesonide -formoterol  (SYMBICORT ) 80-4.5 MCG/ACT inhaler Take 2 puffs first thing in am and then another 2 puffs about 12 hours later.   Calcium  Carb-Cholecalciferol (CALCIUM  1000 + D PO) Take 1 tablet by mouth daily.   Cholecalciferol (VITAMIN D3) 125 MCG (5000 UT) CAPS Take 1 capsule (5,000 Units total) by mouth daily.   Continuous Glucose Receiver (FREESTYLE LIBRE 3 READER) DEVI 1 Piece by Does not apply route once as needed for up to 1 dose.   ferrous sulfate 325 (65 FE) MG EC tablet Take 325 mg by mouth daily with breakfast.   losartan (COZAAR) 50 MG tablet Take 1 tablet by mouth daily.   metFORMIN (GLUCOPHAGE) 1000 MG tablet Take 1,000 mg by mouth 2 (two) times daily with a meal.    metroNIDAZOLE (METROCREAM) 0.75 % cream Apply 1 Application topically 2 (two) times daily.   Multiple Vitamin (MULTIVITAMIN) capsule Take 1 capsule by mouth daily.   pantoprazole  (PROTONIX ) 40 MG tablet Take 1 tablet (40 mg total) by mouth 2 (two) times daily before a meal.   PARoxetine (PAXIL) 40 MG tablet Take 40 mg by mouth every morning.   propranolol  (INDERAL ) 20 MG tablet Take 10 mg by mouth every 6 (six) hours as needed (anxiety).   rosuvastatin  (CRESTOR ) 10 MG tablet Take 10 mg by mouth at bedtime.   SUMAtriptan (IMITREX) 100 MG tablet Take 100 mg by mouth every 2 (two) hours as needed.   tirzepatide  (MOUNJARO ) 5 MG/0.5ML Pen Inject 5 mg into the skin once a week.   topiramate (TOPAMAX) 25 MG tablet Take 100 mg by mouth daily.   valACYclovir (VALTREX) 1000 MG tablet Take 1,000 mg by mouth.   [DISCONTINUED] Continuous Glucose Sensor (FREESTYLE LIBRE 3 PLUS SENSOR) MISC CHANGE SENSOR EVERY 15 DAYS   [DISCONTINUED] insulin  glargine (LANTUS  SOLOSTAR) 100 UNIT/ML Solostar Pen Inject 20 Units  into the skin at bedtime.   [DISCONTINUED] levothyroxine  (SYNTHROID ) 200 MCG tablet TAKE 1 TABLET BY MOUTH ONCE DAILY BEFORE BREAKFAST   Continuous Glucose Sensor (FREESTYLE LIBRE 3 PLUS SENSOR) MISC Change sensor every 15 days.   diazepam  (VALIUM ) 5 MG tablet Take 1 by mouth 1 hour  pre-procedure with very light food. Do not drive motor vehicle. (Patient not taking: Reported on 01/23/2024)   levothyroxine  (SYNTHROID ) 150 MCG tablet Take 1 tablet (150 mcg total) by mouth daily before breakfast.   Facility-Administered Encounter Medications as of 01/30/2024  Medication   methylPREDNISolone  acetate (DEPO-MEDROL ) injection 40 mg    ALLERGIES: Allergies  Allergen Reactions   Montelukast Sodium Dermatitis    montelukast sodium   Doxycycline Rash   Singulair [Montelukast] Rash    VACCINATION  STATUS:  There is no immunization history on file for this patient.   HPI  Melissa Schmidt is 48 y.o. 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  First treatment was administered in November 2020, with inadequate response.  She was treated again in January 2022.  She was initiated on levothyroxine  for RAI induced hypothyroidism during her prior visits.  She is currently on levothyroxine  200 mcg p.o. daily before breakfast.  She has had better engagement in lifestyle measures, currently presents with loss of weight on Mounjaro .  Her previsit labs are consistent with over replacement.    She also has type 2 diabetes with recent loss of control with A1c of 6.1% improving from 11.4%.  She remains on low-dose Lantus    She wears a CGM showing average blood glucose 102 mg per DL, with 01% time in range, 1% mild hyperglycemia.    she denies family history of thyroid  dysfunction nor any thyroid  cancer.  She is grieving the death of her brother by suicide.                           Review of systems  Limited as above  Objective:    BP  101/68   Pulse 76   Resp 18   Ht 5' 3 (1.6 m)   Wt 258 lb 12.8 oz (117.4 kg)   LMP 11/07/2020   SpO2 97%   BMI 45.84 kg/m   Wt Readings from Last 3 Encounters:  01/30/24 258 lb 12.8 oz (117.4 kg)  01/23/24 258 lb (117 kg)  01/16/24 265 lb 8 oz (120.4 kg)                   Component Value Date/Time   NA 140 01/25/2024 0922   K 4.6 01/25/2024 0922   CL 103 01/25/2024 0922   CO2 25 01/25/2024 0922   GLUCOSE 109 (H) 01/25/2024 0922   GLUCOSE 419 (H) 04/12/2023 1720   BUN 16 01/25/2024 0922   CREATININE 0.79 01/25/2024 0922   CALCIUM  9.7 01/25/2024 0922   PROT 6.6 01/25/2024 0922   ALBUMIN 4.2 01/25/2024 0922   AST 25 01/25/2024 0922   ALT 29 01/25/2024 0922   ALKPHOS 85 01/25/2024 0922   BILITOT 0.3 01/25/2024 0922   GFRNONAA >60 04/12/2023 1720   GFRAA 131 11/22/2019 0000     CBC    Component Value Date/Time   WBC 11.4 (H) 01/16/2024 1306   WBC 10.2 04/12/2023 1720   RBC 5.12 01/16/2024 1306   RBC 5.68 (H) 04/12/2023 1720   HGB 13.0 01/16/2024 1306   HCT 41.5 01/16/2024 1306   PLT 380 01/16/2024 1306   MCV 81 01/16/2024 1306   MCH 25.4 (L) 01/16/2024 1306   MCH 25.2 (L) 04/12/2023 1720   MCHC 31.3 (L) 01/16/2024 1306   MCHC 31.4 04/12/2023 1720   RDW 14.2 01/16/2024 1306   LYMPHSABS 2.6 01/16/2024 1306   EOSABS 0.2 01/16/2024 1306   BASOSABS 0.0 01/16/2024 1306     Diabetic Labs (most recent): Lab Results  Component Value Date   HGBA1C 6.1 (A) 01/30/2024   HGBA1C 7.8 (A) 10/25/2023   HGBA1C 11.4 07/19/2023    Lipid Panel     Component Value Date/Time   CHOL 121 01/25/2024 0922   TRIG 113 01/25/2024 0922   HDL 43 01/25/2024 0922   CHOLHDL 2.8 01/25/2024 0922   LDLCALC  57 01/25/2024 0922     Thyroid  uptake and scan on October 20, 20 FINDINGS: Homogeneous tracer accumulation within both thyroid  lobes, diffusely increased.   No focal areas of increased or decreased tracer localization.  4 hour I-123 uptake = 57% (normal 5-20%)  24 hour  I-123 uptake = 68% (normal 10-30%)   IMPRESSION: Diffusely increased homogeneous tracer accumulation in both thyroid  lobes.   Increased 4 hour and 24 hour radio iodine uptakes consistent with hyperthyroidism.  Findings consistent with Graves disease.  I-131 thyroid  ablation on December 12, 2018  Her response was only partial. Repeat thyroid  uptake and scan on January 16, 2020 FINDINGS: Homogeneous tracer distribution in both thyroid  lobes.  No focal areas of increased or decreased tracer localization.   4 hour I-123 uptake = 27.7% (normal 5-20%),  24 hour I-123 uptake = 43.7% (normal 10-30%)   IMPRESSION: Normal thyroid  scan.  Mildly elevated 4 hour and 24 hour radio iodine uptakes.  Findings consistent with hyperthyroidism and Graves disease.   4 hour and 24 hour radio iodine uptakes have decreased since the previous exam.    Recent Results (from the past 2160 hours)  CBC w/Diff/Platelet     Status: Abnormal   Collection Time: 11/09/23  4:17 PM  Result Value Ref Range   WBC 12.2 (H) 3.4 - 10.8 x10E3/uL   RBC 4.88 3.77 - 5.28 x10E6/uL   Hemoglobin 12.5 11.1 - 15.9 g/dL   Hematocrit 60.7 65.9 - 46.6 %   MCV 80 79 - 97 fL   MCH 25.6 (L) 26.6 - 33.0 pg   MCHC 31.9 31.5 - 35.7 g/dL   RDW 86.0 88.2 - 84.5 %   Platelets 386 150 - 450 x10E3/uL   Neutrophils 69 Not Estab. %   Lymphs 22 Not Estab. %   Monocytes 6 Not Estab. %   Eos 2 Not Estab. %   Basos 1 Not Estab. %   Neutrophils Absolute 8.6 (H) 1.4 - 7.0 x10E3/uL   Lymphocytes Absolute 2.7 0.7 - 3.1 x10E3/uL   Monocytes Absolute 0.7 0.1 - 0.9 x10E3/uL   EOS (ABSOLUTE) 0.2 0.0 - 0.4 x10E3/uL   Basophils Absolute 0.1 0.0 - 0.2 x10E3/uL   Immature Granulocytes 0 Not Estab. %   Immature Grans (Abs) 0.0 0.0 - 0.1 x10E3/uL  Comprehensive Metabolic Panel (CMET)     Status: Abnormal   Collection Time: 11/09/23  4:17 PM  Result Value Ref Range   Glucose 132 (H) 70 - 99 mg/dL   BUN 11 6 - 24 mg/dL   Creatinine, Ser 9.26  0.57 - 1.00 mg/dL   eGFR 896 >40 fO/fpw/8.26   BUN/Creatinine Ratio 15 9 - 23   Sodium 138 134 - 144 mmol/L   Potassium 4.4 3.5 - 5.2 mmol/L   Chloride 101 96 - 106 mmol/L   CO2 23 20 - 29 mmol/L   Calcium  9.4 8.7 - 10.2 mg/dL   Total Protein 6.7 6.0 - 8.5 g/dL   Albumin 4.0 3.9 - 4.9 g/dL   Globulin, Total 2.7 1.5 - 4.5 g/dL   Bilirubin Total 0.4 0.0 - 1.2 mg/dL   Alkaline Phosphatase 80 41 - 116 IU/L   AST 24 0 - 40 IU/L   ALT 23 0 - 32 IU/L  Lipase     Status: None   Collection Time: 11/09/23  4:17 PM  Result Value Ref Range   Lipase 29 14 - 72 U/L  Tissue transglutaminase, IgA     Status: Abnormal   Collection  Time: 11/09/23  4:17 PM  Result Value Ref Range   t-Transglutaminase (tTG) IgA 5 (H) 0 - 3 U/mL    Comment:                               Negative        0 -  3                               Weak Positive   4 - 10                               Positive           >10  Tissue Transglutaminase (tTG) has been identified  as the endomysial antigen.  Studies have demonstr-  ated that endomysial IgA antibodies have over 99%  specificity for gluten sensitive enteropathy.   Pregnancy, urine     Status: None   Collection Time: 11/28/23 12:23 PM  Result Value Ref Range   Preg Test, Ur NEGATIVE NEGATIVE    Comment:        THE SENSITIVITY OF THIS METHODOLOGY IS >20 mIU/mL. Performed at [facility], [address] [place], [place] [address]   Glucose, capillary     Status: None   Collection Time: 12/01/23  7:39 AM  Result Value Ref Range   Glucose-Capillary 92 70 - 99 mg/dL    Comment: Glucose reference range applies only to samples taken after fasting for at least 8 hours.  Surgical pathology     Status: None   Collection Time: 12/01/23  9:47 AM  Result Value Ref Range   SURGICAL PATHOLOGY      SURGICAL PATHOLOGY CASE: [id number] PATIENT: Shabre Hitchcock Surgical Pathology Report     Clinical History: Abdominal pain, hx of celiac disease     FINAL MICROSCOPIC  DIAGNOSIS:  A. DUODENAL, BIOPSY: Duodenal mucosa with patchy villous blunting, focal increased intraepithelial lymphocytes and focal crypt hyperplasia. See comment.  B. ESOPHAGEAL, DISTAL, BIOPSY: Unremarkable squamous mucosa. Negative for eosinophilic esophagitis.  C. ESOPHAGEAL, MID, BIOPSY: Unremarkable squamous mucosa. Negative for eosinophilic esophagitis.  D. GASTRIC, POLYPECTOMY: Fundic gland polyps (2).  COMMENT: A.  The duodenal biopsies show patchy villous blunting associated with increased intraepithelial lymphocytes and crypt hyperplasia.  There are also fragments with normal histologic features and the combination suggest partially treated sprue.  There is focal pyloric metaplasia which also suggest a component of peptic duodenitis.  GROSS DESCRIPTION: A: Received in forma lin are tan, soft tissue fragments that are submitted in toto. Number: Multiple size: Range from 0.2 to 0.4 cm blocks: 1  B: Received in formalin are tan, soft tissue fragments that are submitted in toto. Number: 4 size: Range from 0.3 to 0.6 cm blocks: 1  C: Received in formalin are tan, soft tissue fragments that are submitted in toto. Number: 3 size: Range from 0.2 to 0.3 cm blocks: 1  D: Received in formalin are tan, soft tissue fragments that are submitted in toto. Number: 2 size: 0.2 and 0.3 cm blocks: 1 SHIRLEEN 12/01/2023)    Final Diagnosis performed by Norleen Dover, MD.   Electronically signed 12/04/2023 Technical component performed at [facility], [address] [place], [place] [address].  Professional component performed at [employer]. [facility], [address] [place],  [place] [address].  Immunohistochemistry Technical component (if applicable) was performed at [facility]. [address], [address],  [place], [place] [address].   IMMUNOHISTOCHEMISTRY DISCLAIMER (if applicable): Some of these immunohistochemical stains may have  been developed and the performance characteristics determine by [facility]. Some may not have been cleared or approved by the U.S. Food and Drug Administration. The FDA has determined that such clearance or approval is not necessary. This test is used for clinical purposes. It should not be regarded as investigational or for research. This laboratory is certified under the Clinical Laboratory Improvement Amendments of 1988 (CLIA-88) as qualified to perform high complexity clinical laboratory testing.  The controls stained appropriately.   IHC stains are performed on formalin fixed, paraffin embedded tissue using a 3,3diaminobenzidine (DAB) chromogen and Leica Bond Autostainer System. The staining intensity of the nucleus is score manually and is reported as the percentage of tumor cell nuclei demonstrating specific nuclear staining. T he specimens are fixed in 10% Neutral Formalin for at least 6 hours and up to 72hrs. These tests are validated on decalcified tissue. Results should be interpreted with caution given the possibility of false negative results on decalcified specimens. Antibody Clones are as follows ER-clone [id number], PR-clone 16, Ki67- clone MM1. Some of these immunohistochemical stains may have been developed and the performance characteristics determined by [facility].   Pregnancy, urine     Status: None   Collection Time: 01/16/24  1:05 PM  Result Value Ref Range   Preg Test, Ur Negative Negative  Vitamin D  (25 hydroxy)     Status: None   Collection Time: 01/16/24  1:06 PM  Result Value Ref Range   Vit D, 25-Hydroxy 56.7 30.0 - 100.0 ng/mL    Comment: Vitamin D  deficiency has been defined by the Institute of Medicine and an Endocrine Society practice guideline as a level of serum 25-OH vitamin D  less than 20 ng/mL (1,2). The Endocrine Society went on to further define vitamin D  insufficiency as a level between 21 and 29 ng/mL (2). 1. IOM (Institute  of Medicine). 2010. Dietary reference    intakes for calcium  and D. [place]: The    [employer]. 2. Holick MF, Binkley Morris, Bischoff-Ferrari HA, et al.    Evaluation, treatment, and prevention of vitamin D     deficiency: an Endocrine Society clinical practice    guideline. JCEM. 2011 Jul; 96(7):1911-30.   C-reactive protein     Status: Abnormal   Collection Time: 01/16/24  1:06 PM  Result Value Ref Range   CRP 17 (H) 0 - 10 mg/L  Sed Rate (ESR)     Status: None   Collection Time: 01/16/24  1:06 PM  Result Value Ref Range   Sed Rate 26 0 - 32 mm/hr  Tissue transglutaminase, IgA     Status: None   Collection Time: 01/16/24  1:06 PM  Result Value Ref Range   t-Transglutaminase (tTG) IgA 3 0 - 3 U/mL    Comment:                               Negative        0 -  3                               Weak Positive   4 - 10  Positive           >10  Tissue Transglutaminase (tTG) has been identified  as the endomysial antigen.  Studies have demonstr-  ated that endomysial IgA antibodies have over 99%  specificity for gluten sensitive enteropathy.   Comprehensive Metabolic Panel (CMET)     Status: Abnormal   Collection Time: 01/16/24  1:06 PM  Result Value Ref Range   Glucose 82 70 - 99 mg/dL   BUN 17 6 - 24 mg/dL   Creatinine, Ser 9.28 0.57 - 1.00 mg/dL   eGFR 893 >40 fO/fpw/8.26   BUN/Creatinine Ratio 24 (H) 9 - 23   Sodium 138 134 - 144 mmol/L   Potassium 4.3 3.5 - 5.2 mmol/L   Chloride 99 96 - 106 mmol/L   CO2 26 20 - 29 mmol/L   Calcium  10.3 (H) 8.7 - 10.2 mg/dL   Total Protein 7.2 6.0 - 8.5 g/dL   Albumin 4.4 3.9 - 4.9 g/dL   Globulin, Total 2.8 1.5 - 4.5 g/dL   Bilirubin Total 0.2 0.0 - 1.2 mg/dL   Alkaline Phosphatase 80 41 - 116 IU/L   AST 30 0 - 40 IU/L   ALT 30 0 - 32 IU/L  CBC w/Diff/Platelet     Status: Abnormal   Collection Time: 01/16/24  1:06 PM  Result Value Ref Range   WBC 11.4 (H) 3.4 - 10.8 x10E3/uL   RBC 5.12  3.77 - 5.28 x10E6/uL   Hemoglobin 13.0 11.1 - 15.9 g/dL   Hematocrit 58.4 65.9 - 46.6 %   MCV 81 79 - 97 fL   MCH 25.4 (L) 26.6 - 33.0 pg   MCHC 31.3 (L) 31.5 - 35.7 g/dL   RDW 85.7 88.2 - 84.5 %   Platelets 380 150 - 450 x10E3/uL   Neutrophils 69 Not Estab. %   Lymphs 23 Not Estab. %   Monocytes 6 Not Estab. %   Eos 2 Not Estab. %   Basos 0 Not Estab. %   Neutrophils Absolute 7.8 (H) 1.4 - 7.0 x10E3/uL   Lymphocytes Absolute 2.6 0.7 - 3.1 x10E3/uL   Monocytes Absolute 0.7 0.1 - 0.9 x10E3/uL   EOS (ABSOLUTE) 0.2 0.0 - 0.4 x10E3/uL   Basophils Absolute 0.0 0.0 - 0.2 x10E3/uL   Immature Granulocytes 0 Not Estab. %   Immature Grans (Abs) 0.0 0.0 - 0.1 x10E3/uL  Alpha-Gal Panel     Status: None   Collection Time: 01/16/24  1:06 PM  Result Value Ref Range   Class Description Allergens Comment     Comment:     Levels of Specific IgE       Class  Description of Class     ---------------------------  -----  --------------------                    < 0.10         0         Negative            0.10 -    0.31         0/I       Equivocal/Low            0.32 -    0.55         I         Low            0.56 -    1.40         II  Moderate            1.41 -    3.90         III       High            3.91 -   19.00         IV        Very High           19.01 -  100.00         V         Very High                   >100.00         VI        Very High    IgE (Immunoglobulin E), Serum 100 6 - 495 IU/mL   Pork IgE <0.10 Class 0 kU/L   Beef IgE <0.10 Class 0 kU/L   Allergen Lamb IgE <0.10 Class 0 kU/L   O215-IgE Alpha-Gal <0.10 Class 0 kU/L  Iron, TIBC and Ferritin Panel     Status: Abnormal   Collection Time: 01/16/24  1:06 PM  Result Value Ref Range   Total Iron Binding Capacity 280 250 - 450 ug/dL   UIBC 754 868 - 574 ug/dL   Iron 35 27 - 840 ug/dL   Iron Saturation 13 (L) 15 - 55 %   Ferritin 125 15 - 150 ng/mL  Comprehensive metabolic panel with GFR     Status: Abnormal   Collection  Time: 01/25/24  9:22 AM  Result Value Ref Range   Glucose 109 (H) 70 - 99 mg/dL   BUN 16 6 - 24 mg/dL   Creatinine, Ser 9.20 0.57 - 1.00 mg/dL   eGFR 93 >40 fO/fpw/8.26   BUN/Creatinine Ratio 20 9 - 23   Sodium 140 134 - 144 mmol/L   Potassium 4.6 3.5 - 5.2 mmol/L   Chloride 103 96 - 106 mmol/L   CO2 25 20 - 29 mmol/L   Calcium  9.7 8.7 - 10.2 mg/dL   Total Protein 6.6 6.0 - 8.5 g/dL   Albumin 4.2 3.9 - 4.9 g/dL   Globulin, Total 2.4 1.5 - 4.5 g/dL   Bilirubin Total 0.3 0.0 - 1.2 mg/dL   Alkaline Phosphatase 85 41 - 116 IU/L   AST 25 0 - 40 IU/L   ALT 29 0 - 32 IU/L  Lipid panel     Status: None   Collection Time: 01/25/24  9:22 AM  Result Value Ref Range   Cholesterol, Total 121 100 - 199 mg/dL   Triglycerides 886 0 - 149 mg/dL   HDL 43 >60 mg/dL   VLDL Cholesterol Cal 21 5 - 40 mg/dL   LDL Chol Calc (NIH) 57 0 - 99 mg/dL   Chol/HDL Ratio 2.8 0.0 - 4.4 ratio    Comment:                                   T. Chol/HDL Ratio                                             Men  Women  1/2 Avg.Risk  3.4    3.3                                   Avg.Risk  5.0    4.4                                2X Avg.Risk  9.6    7.1                                3X Avg.Risk 23.4   11.0   TSH     Status: Abnormal   Collection Time: 01/25/24  9:22 AM  Result Value Ref Range   TSH <0.005 (L) 0.450 - 4.500 uIU/mL  T4, free     Status: Abnormal   Collection Time: 01/25/24  9:22 AM  Result Value Ref Range   Free T4 2.34 (H) 0.82 - 1.77 ng/dL  IBD Expanded Panel     Status: Abnormal   Collection Time: 01/26/24  2:32 PM  Result Value Ref Range   gASCA 16 0 - 50 units    Comment:     Negative: <45  Equivocal: 45-50  Positive:  >50   ACCA 45 0 - 90 units    Comment:     Negative: <80  Equivocal: 80-90  Positive: >90   ALCA 89 (H) 0 - 60 units    Comment:     Negative:<55  Equivocal: 55-60 Positive: >60   AMCA 32 0 - 100 units    Comment:     Negative: <90  Equivocal:   90-100  Positive: >100     This test was developed and its performance     characteristics determined by [facility]. It has not     been cleared or approved by the Food and Drug     Administration. The FDA has determined that such     clearance or approval is not necessary.    Atypical pANCA Negative Negative   Comments Comment (A)     Comment: Suggestive of Crohn's Disease.  Pattern is not conclusive for disease behavior risk stratification.   HgB A1c     Status: Abnormal   Collection Time: 01/30/24  2:40 PM  Result Value Ref Range   Hemoglobin A1C 6.1 (A) 4.0 - 5.6 %   HbA1c POC (<> result, manual entry)     HbA1c, POC (prediabetic range)     HbA1c, POC (controlled diabetic range)     On February 21, 2020 she received second dose of RAI thyroid  ablative therapy: Patient then received the radiopharmaceutical without immediate complication.   RADIOPHARMACEUTICALS:  25.8 mCi I-131 sodium iodide orally   IMPRESSION: Per oral administration of I-131 sodium iodide for the treatment of recurrent hyperthyroidism.  Assessment & Plan:   1.  RAI induced hypothyroidism  2. Graves' disease-resolved 3.  Type 2 diabetes 4.  Vitamin D  deficiency 5.  Morbid obesity 6.  Hyperlipidemia  -She  has responded to the second treatment with RAI with clinical and biochemical hypothyroidism.    Her previsit labs are consistent with over replacement.  I discussed and lowered her levothyroxine  to 150 mcg p.o. daily before breakfast.     - We discussed about the correct intake of her thyroid  hormone, on empty stomach at fasting, with water ,  separated by at least 30 minutes from breakfast and other medications,  and separated by more than 4 hours from calcium , iron, multivitamins, acid reflux medications (PPIs). -Patient is made aware of the fact that thyroid  hormone replacement is needed for life, dose to be adjusted by periodic monitoring of thyroid  function tests.   Related to her metabolic syndrome  involving morbid obesity, type 2 diabetes, MASLD, hyperlipidemia, she is encouraged to stay engaged with lifestyle medicine and her medications.  She he is benefiting from Mounjaro .  In light of her GI complaints, she is advised to continue her current dose of Mounjaro  at 5 mg subcutaneously weekly.   She is also advised to continue metformin 1000 mg p.o. twice daily with breakfast and supper.  At this point, she is advised to discontinue Lantus .     - she acknowledges that there is a room for improvement in her food and drink choices. - Suggestion is made for her to avoid simple carbohydrates  from her diet including Cakes, Sweet Desserts, Ice Cream, Soda (diet and regular), Sweet Tea, Candies, Chips, Cookies, Store Bought Juices, Alcohol , Artificial Sweeteners,  Coffee Creamer, and Sugar-free Products, Lemonade. This will help patient to have more stable blood glucose profile and potentially avoid unintended weight gain.  The following Lifestyle Medicine recommendations according to American College of Lifestyle Medicine  [facility]) were discussed and and offered to patient and she  agrees to start the journey:  A. Whole Foods, Plant-Based Nutrition comprising of fruits and vegetables, plant-based proteins, whole-grain carbohydrates was discussed in detail with the patient.   A list for source of those nutrients were also provided to the patient.  Patient will use only water  or unsweetened tea for hydration. B.  The need to stay away from risky substances including alcohol, smoking; obtaining 7 to 9 hours of restorative sleep, at least 150 minutes of moderate intensity exercise weekly, the importance of healthy social connections,  and stress management techniques were discussed. C.  A full color page of  Calorie density of various food groups per pound showing examples of each food groups was provided to the patient.   She has severe dyslipidemia with MASLD, I advised her to stay close to her GI  doctors.  She is advised to continue Crestor  10 mg p.o. nightly.   She side effects and precautions discussed with her. She is advised to continue vitamin D3 5000 units daily.   - I advised her to maintain close follow up with Marchelle Clem Pitts, MD for primary care needs.   I spent  26  minutes in the care of the patient today including review of labs from CMP, Lipids, Thyroid  Function, Hematology (current and previous including abstractions from other facilities); face-to-face time discussing  her blood glucose readings/logs, discussing hypoglycemia and hyperglycemia episodes and symptoms, medications doses, her options of short and long term treatment based on the latest standards of care / guidelines;  discussion about incorporating lifestyle medicine;  and documenting the encounter. Risk reduction counseling performed per USPSTF guidelines to reduce  obesity and cardiovascular risk factors.     Please refer to Patient Instructions for Blood Glucose Monitoring and Insulin /Medications Dosing Guide  in media tab for additional information. Please  also refer to  Patient Self Inventory in the Media  tab for reviewed elements of pertinent patient history.  Taletha Twiford Bookbinder participated in the discussions, expressed understanding, and voiced agreement with the above plans.  All questions were answered to her satisfaction.  she is encouraged to contact clinic should she have any questions or concerns prior to her return visit.   Follow up plan: Return in about 3 months (around 04/29/2024) for F/U with Pre-visit Labs, A1c -NV.   Thank you for involving me in the care of this pleasant patient, and I will continue to update you with her progress.  Ranny Earl, MD [facility] Phone: [phone]  Fax: [phone]   01/30/2024, 3:01 PM  This note was partially dictated with voice recognition software. Similar sounding words can be transcribed  inadequately or may not  be corrected upon review. "

## 2024-01-30 NOTE — Patient Instructions (Signed)

## 2024-02-07 NOTE — Procedures (Cosign Needed)
 SABRA

## 2024-02-09 ENCOUNTER — Telehealth (INDEPENDENT_AMBULATORY_CARE_PROVIDER_SITE_OTHER): Payer: Self-pay

## 2024-02-09 NOTE — Telephone Encounter (Signed)
 Patient called in call was answered. Patient asked if she was suppose to stop her Majaro shot prior to surgery I asked the patient to hold while I went and talked to the surgical scheduling department and looked into her chart. I let the patient know to go ahead and stop the shot. Patient understood.

## 2024-02-12 ENCOUNTER — Encounter: Payer: Self-pay | Admitting: Nutrition

## 2024-02-12 NOTE — Patient Instructions (Signed)
 Goals Keep up the fantastic job!!! Lose 2-3 lbs per month Focus on whole plant based foods. A gallon of water  per day Walk 30 minutes Aim for 25 grams of fiber per day.SABRA

## 2024-02-13 ENCOUNTER — Encounter (HOSPITAL_COMMUNITY): Payer: Self-pay | Admitting: Otolaryngology

## 2024-02-13 ENCOUNTER — Ambulatory Visit: Admitting: Sports Medicine

## 2024-02-13 ENCOUNTER — Other Ambulatory Visit: Payer: Self-pay

## 2024-02-13 NOTE — Progress Notes (Signed)
 SDW CALL  Patient was given pre-op instructions over the phone. The opportunity was given for the patient to ask questions. No further questions asked. Patient verbalized understanding of instructions given.   PCP - Clem Pence Cardiologist - Dr. Gatha Oiler at [facility] - Dr. Darlean  PPM/ICD - denies Device Orders - n/a Rep Notified - n/a  Chest x-ray - denies EKG - pt states she had an EKG in October - requested from cardiologist but if not received, will need DOS Stress Test - denies ECHO - November 2025 Cardiac Cath - denies  Sleep Study - OSA+ and wears CPAP   Patient wears a [employer] on left arm.  Patient educated to check blood sugar morning of surgery and if less than 70, patient is to drink 1/2 cup of apple or cranberry juice  Last dose of GLP1 agonist-  last dose of Mounjaro  was 12/29 - patient aware not to take another dose prior to surgery   Blood Thinner Instructions: n/a Aspirin Instructions: n/a  ERAS Protcol - clears until 1000 PRE-SURGERY Ensure or G2-  n/a  COVID TEST- n/a   Anesthesia review: yes - recent echo for SOB but patient states that she is no longer having issues with SOB  Patient denies shortness of breath, fever, cough and chest pain over the phone call   All instructions explained to the patient, with a verbal understanding of the material. Patient agrees to go over the instructions while at home for a better understanding.

## 2024-02-14 ENCOUNTER — Encounter: Payer: Self-pay | Admitting: Physician Assistant

## 2024-02-14 ENCOUNTER — Ambulatory Visit (INDEPENDENT_AMBULATORY_CARE_PROVIDER_SITE_OTHER): Admitting: Physician Assistant

## 2024-02-14 ENCOUNTER — Other Ambulatory Visit (INDEPENDENT_AMBULATORY_CARE_PROVIDER_SITE_OTHER): Payer: Self-pay

## 2024-02-14 DIAGNOSIS — M25561 Pain in right knee: Secondary | ICD-10-CM

## 2024-02-14 DIAGNOSIS — M25562 Pain in left knee: Secondary | ICD-10-CM

## 2024-02-14 DIAGNOSIS — M170 Bilateral primary osteoarthritis of knee: Secondary | ICD-10-CM | POA: Diagnosis not present

## 2024-02-14 NOTE — Addendum Note (Signed)
 Addended by: Mrk Buzby L on: 02/14/2024 02:53 PM   Modules accepted: Orders

## 2024-02-14 NOTE — Progress Notes (Signed)
 "  Office Visit Note   Patient: Melissa Schmidt           Date of Birth: 28-Jun-1975           MRN: [id number] Visit Date: 02/14/2024              Requested by: Marchelle Clem Pitts, MD [address] [place],  [place] [id number] PCP: Marchelle Clem Pitts, MD   Assessment & Plan: Visit Diagnoses:  1. Bilateral primary osteoarthritis of knee     Plan: Impression is advanced bilateral knee osteoarthritis right greater than left.  We discussed various treatment options to include cortisone injection versus viscosupplementation injection.  She tells me cortisone is not helped in the past and is not interested in repeating this.  She would like to get approval for bilateral knee viscosupplementation injections.  Follow-up once approved.  This patient is diagnosed with osteoarthritis of the knee(s).    Radiographs show evidence of joint space narrowing, osteophytes, subchondral sclerosis and/or subchondral cysts.  This patient has knee pain which interferes with functional and activities of daily living.    This patient has experienced inadequate response, adverse effects and/or intolerance with conservative treatments such as acetaminophen , NSAIDS, topical creams, physical therapy or regular exercise, knee bracing and/or weight loss.   This patient has experienced inadequate response or has a contraindication to intra articular steroid injections for at least 3 months.   This patient is not scheduled to have a total knee replacement within 6 months of starting treatment with viscosupplementation.   Follow-Up Instructions: Return for once approved for visco inj.   Orders:  Orders Placed This Encounter  Procedures   XR KNEE 3 VIEW LEFT   XR KNEE 3 VIEW RIGHT   No orders of the defined types were placed in this encounter.     Procedures: No procedures performed   Clinical Data: No additional findings.   Subjective: Chief Complaint  Patient presents with   Right Knee - Pain   Left  Knee - Pain    HPI patient is a pleasant 49 year old female who comes in today with bilateral knee pain right greater than left.  She does have an underlying history of osteoarthritis to both knees.  She underwent gel injection several years ago which significantly helped.  Previous cortisone injections provided no relief.  She is doing well until this summer when she fell landing on her right knee.  She has had constant pain throughout the entire right knee since.  Intermittent pain to the left knee.  She has associated swelling and pulling sensations to the right knee worse with stair climbing, standing for a long time or walking.  She has tried Tylenol  which does not help.  She is unable to take NSAIDs due to GI ulcers.  Review of Systems as detailed in HPI.  All others reviewed and are negative.   Objective: Vital Signs: LMP 11/07/2020   Physical Exam well-developed well-nourished female in no acute distress.  Alert and orient x 3.  Ortho Exam bilateral knee exam: Range of motion of 0 to 90 degrees.  Medial and lateral joint line tenderness both sides.  Moderate patellofemoral crepitus.  She is able to fully straight leg raise on the right.  She is neurovascular intact distally.  Specialty Comments:  CLINICAL DATA:  Low back pain, symptoms persist with > 6 wks treatment   EXAM: MRI LUMBAR SPINE WITHOUT CONTRAST   TECHNIQUE: Multiplanar, multisequence MR imaging of the lumbar spine was performed.  No intravenous contrast was administered.   COMPARISON:  None Available.   FINDINGS: Segmentation:  Standard.   Alignment:  Physiologic.   Vertebrae:  No fracture, evidence of discitis, or bone lesion.   Conus medullaris and cauda equina: Conus extends to the L1-L2 disc space level. Conus and cauda equina appear normal.   Paraspinal and other soft tissues: Negative.   Disc levels:   T12-L1: Unremarkable   L1-L2: Unremarkable   L2-L3: Unremarkable   L3-L4: Mild bilateral  facet degenerative change. No spinal canal or neural foraminal narrowing.   L4-L5: Moderate to severe bilateral facet degenerative change. No spinal canal narrowing. No neural foraminal narrowing.   L5-S1: Moderate to severe bilateral facet degenerative change. No spinal canal narrowing. No neural foraminal narrowing.   IMPRESSION: 1. Moderate to severe bilateral facet degenerative change at L4-L5 and L5-S1. 2. No spinal canal or neural foraminal narrowing.     Electronically Signed   By: [name] M.D.   On: 01/04/2023 17:18  Imaging: XR KNEE 3 VIEW LEFT Result Date: 02/14/2024 X-rays demonstrate moderate to advanced degenerative changes of the medial and patellofemoral compartments with lateral compartment osteophyte formation  XR KNEE 3 VIEW RIGHT Result Date: 02/14/2024 X-rays demonstrate moderate to advanced degenerative changes of the medial and patellofemoral compartments with lateral compartment osteophyte formation    PMFS History: Patient Active Problem List   Diagnosis Date Noted   Loss of weight 01/16/2024   Chronic maxillary sinusitis 12/27/2023   Impacted cerumen of right ear 11/23/2023   Right-sided tinnitus 11/23/2023   Conductive hearing loss in right ear 11/23/2023   Deviated nasal septum 11/23/2023   Hypertrophy of nasal turbinates 11/23/2023   Chronic pansinusitis 11/23/2023   Upper airway cough syndrome 07/24/2023   Metabolic dysfunction-associated steatohepatitis (MASH) 05/11/2023   DOE (dyspnea on exertion) 05/02/2023   Sinus tachycardia 12/01/2022   Chronic cholecystitis 11/07/2022   Mixed hyperlipidemia 05/16/2022   Morbid obesity (HCC) 04/06/2021   IDA (iron deficiency anemia) 08/20/2020   Vitamin D  deficiency 08/20/2020   Type 2 diabetes mellitus without complication, without long-term current use of insulin  (HCC) 07/21/2020   Iron deficiency anemia 12/31/2019   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   Hemorrhoids  12/11/2018   Graves' disease 11/27/2018   RUQ abdominal pain 10/10/2018   Celiac disease 10/10/2018   GERD (gastroesophageal reflux disease) 10/10/2018   Hyperthyroidism 10/10/2017   Uncontrolled type 2 diabetes mellitus with hyperglycemia (HCC) 10/10/2017   Mucosal abnormality of stomach    Dysphagia    Positive autoantibody screening for celiac disease 04/29/2015   Fatty liver 03/19/2015   Colicky RUQ abdominal pain 03/19/2015   RLQ abdominal pain 03/19/2015   Rectal bleeding 03/19/2015   Past Medical History:  Diagnosis Date   Anemia    Anxiety    Arthritis    Asthma    Borderline diabetes    Celiac disease    Complication of anesthesia     I have shaking all over when I am waking up from anesthesia   Depression    Diabetes mellitus without complication (HCC)    Dyspnea    Fatty liver    GERD (gastroesophageal reflux disease)    Hemorrhoids    History of hiatal hernia    Hypertension    Hyperthyroidism    Hypertriglyceridemia    PCOS (polycystic ovarian syndrome)    PONV (postoperative nausea and vomiting)    Sleep apnea    Tachycardia     Family History  Problem  Relation Age of Onset   Irritable bowel syndrome Mother    Colon polyps Mother        ? 5 year surveillance   Inflammatory bowel disease Neg Hx    Colon cancer Neg Hx    Liver disease Neg Hx     Past Surgical History:  Procedure Laterality Date   APPENDECTOMY  2002   CHOLECYSTECTOMY  11/2022   COLONOSCOPY N/A 06/14/2023   Procedure: COLONOSCOPY;  Surgeon: Shaaron Lamar HERO, MD;  [facility];  Service: Endoscopy;  Laterality: N/A;  815am, asa 3   COLONOSCOPY WITH PROPOFOL  N/A 05/25/2015   Dr.Rourk- non-bleeding hemorrhoids, the examination was o/w normal   ESOPHAGEAL DILATION N/A 05/25/2015   Procedure: ESOPHAGEAL DILATION;  Surgeon: Lamar HERO Shaaron, MD;  [facility]: [facility];  Service: Endoscopy;  Laterality: N/A;   ESOPHAGOGASTRODUODENOSCOPY  04/2017   [place]: EGD with  irregular Z-line s/p biopsy, benign-appearing esophageal stenosis s/p dilation, normal examined duodenum but no biopsies of duodenum. Pathology with junctional type GE mucosa with chronic inflammation and surface erosion, no metaplasia, negative dysplasia.    ESOPHAGOGASTRODUODENOSCOPY N/A 12/01/2023   Procedure: EGD (ESOPHAGOGASTRODUODENOSCOPY);  Surgeon: Shaaron Lamar HERO, MD;  [facility];  Service: Endoscopy;  Laterality: N/A;  9:15 AM, ASA 3   ESOPHAGOGASTRODUODENOSCOPY (EGD) WITH PROPOFOL  N/A 05/25/2015   Dr.Rourk- normal esophagus, dilated, gastric erosions, intact fundoplication, normal third portion of the duodenum, non-bleeding erosive gastropathy. duodenum bx= benign small bowel mucosa with patchy increased intraepithelial lymphocytes, mild villous blunting and mild crypt hyperplasia, no dysplasia or malignancy stomach bx= erosive gastritis with reactive changes   GIVENS CAPSULE STUDY N/A 01/09/2024   Procedure: IMAGING PROCEDURE, GI TRACT, INTRALUMINAL, VIA CAPSULE;  Surgeon: Shaaron Lamar HERO, MD;  [facility]: [facility];  Service: Endoscopy;  Laterality: N/A;  730am   LIVER BIOPSY N/A 11/11/2022   Procedure: LIVER BIOPSY;  Surgeon: Kallie Manuelita BROCKS, MD;  Location: [facility];  Service: General;  Laterality: N/A;   NISSEN FUNDOPLICATION  2005   Social History   Occupational History   Occupation: in school  Tobacco Use   Smoking status: Never   Smokeless tobacco: Never  Vaping Use   Vaping status: Never Used  Substance and Sexual Activity   Alcohol use: No    Alcohol/week: 0.0 standard drinks of alcohol   Drug use: No   Sexual activity: Yes    Birth control/protection: None        "

## 2024-02-14 NOTE — Anesthesia Preprocedure Evaluation (Addendum)
 "                                  Anesthesia Evaluation  Patient identified by MRN, date of birth, ID band Patient awake  General Assessment Comment:Complication of anesthesia   I have shaking all over when I am waking up from anesthesia  Reviewed: Allergy & Precautions, H&P , NPO status , Patient's Chart, lab work & pertinent test results, reviewed documented beta blocker date and time   History of Anesthesia Complications (+) PONV and history of anesthetic complications  Airway Mallampati: I  TM Distance: >3 FB Neck ROM: full    Dental no notable dental hx. (+) Teeth Intact, Dental Advisory Given   Pulmonary shortness of breath, asthma , sleep apnea    Pulmonary exam normal breath sounds clear to auscultation       Cardiovascular Exercise Tolerance: Good hypertension, Pt. on home beta blockers + DOE   Rhythm:regular Rate:Normal     Neuro/Psych  PSYCHIATRIC DISORDERS Anxiety Depression    negative neurological ROS     GI/Hepatic hiatal hernia,GERD  ,,(+) Hepatitis -  Endo/Other  diabetes, Type 2Hypothyroidism Hyperthyroidism Class 4 obesity  Renal/GU negative Renal ROS  negative genitourinary   Musculoskeletal  (+) Arthritis ,    Abdominal   Peds  Hematology  (+) Blood dyscrasia, anemia   Anesthesia Other Findings   Reproductive/Obstetrics negative OB ROS                              Anesthesia Physical Anesthesia Plan  ASA: 3  Anesthesia Plan: General   Post-op Pain Management: Precedex    Induction:   PONV Risk Score and Plan: Propofol  infusion, Ondansetron , Dexamethasone , Midazolam  and Scopolamine  patch - Pre-op  Airway Management Planned: Oral ETT  Additional Equipment: None  Intra-op Plan:   Post-operative Plan:   Informed Consent: I have reviewed the patients History and Physical, chart, labs and discussed the procedure including the risks, benefits and alternatives for the proposed anesthesia  with the patient or authorized representative who has indicated his/her understanding and acceptance.     Dental Advisory Given  Plan Discussed with: CRNA and Anesthesiologist  Anesthesia Plan Comments: (PAT note written 02/14/2024 by Isaiah Ruder, PA-C.       DISCUSSION: Patient is a 49 year old female scheduled for the above procedure.   History includes never smoker, postoperative N/V, PCOS, DM2, HTN, hypercholesterolemia, tachycardia, hypothyroidism, asthma, dyspnea, celiac disease, GERD, hiatal hernia fatty liver, OSA (uses CPAP), anemia, cholecystectomy (with liver biopsy showing mildly active grade 1 of 3 steatohepatitis without fibrosis 11/11/2022).  She reported shaking after waking up from anesthesia.   She had cardiology follow-up with Dr. Gatha Oiler on 11/29/2023 at [facility] Limited records are viewable in Care Everywhere, but full office note requested (See [facility]  Care Everywhere). Records indicate she was initially seen in 2022 for chest pain and had a stress test and echocardiogram in 06/2020. Stress test was poor quality, with small partially reversible perfusion abnormality in the anterior wall versus artifact. Stress ECG was negative but at submaximal HR. LVEF 55-60%, mild MR on echo. LHC was discussed but never done. Her symptoms resolved after treatment of iron deficiency anemia. She was doing well at her 05/25/2021 follow-up with Dr. Franky Rave. More recently, she was re-evaluated at the same practice by Dr. Gatha Oiler on 11/29/2023. She  indicated that she was seen for dyspnea, which has since resolved. Notes suggest she was seen for BLE edema. Otherwise, he wrote, From a cardiovascular standpoint the patient is doing well. The patient denies any exertional chest pain, left arm or jaw pain, palpitations, diaphoresis, nausea, vomiting, or other anginal symptoms. Likewise, the patient denies any orthopnea, paroxysmal nocturnal  dyspnea, syncope, presyncope, claudication, or stroke-like symptoms. LE Venous US  studies ordered as well as an updated TTE.   12/13/2023 LE venous US  showed normal saphenofemoral junction reflux with varicosities in the RLE, no DVT. 12/25/2023 TTE showed LVEF 70 to 75%, no regional wall motion abnormalities, grade 1 diastolic dysfunction, trace MR, normal RV systolic function, trace TR.     Last visit with pulmonologist Dr. Darlean was on 01/23/2024 for upper airway cough syndrome follow-up. She had marked improvement with GERD treatment and weight loss. As needed pulmonology follow-up planned.SABRA )         Anesthesia Quick Evaluation  "

## 2024-02-14 NOTE — Progress Notes (Addendum)
 Anesthesia Chart Review: SAME DAY WORK-UP  Case: [id number] Date/Time: 02/16/24 1245   Procedure: SEPTOPLASTY, NOSE, WITH NASAL TURBINATE REDUCTION (Bilateral)   Anesthesia type: General   Diagnosis:      Deviated nasal septum [J34.2]     Hypertrophy of nasal turbinates [J34.3]   Pre-op diagnosis:      Deviated nasal septum     Hypertrophy of nasal turbinates   [facility]: [facility] ROOM 08 / [facility]   Surgeons: Karis Clunes, MD       DISCUSSION: Patient is a 49 year old female scheduled for the above procedure.  History includes never smoker, postoperative N/V, PCOS, DM2, HTN, hypercholesterolemia, tachycardia, hypothyroidism, asthma, dyspnea, celiac disease, GERD, hiatal hernia fatty liver, OSA (uses CPAP), anemia, cholecystectomy (with liver biopsy showing mildly active grade 1 of 3 steatohepatitis without fibrosis 11/11/2022).  She reported shaking after waking up from anesthesia.  She had cardiology follow-up with Dr. Gatha Oiler on 11/29/2023 at [facility]. Limited records are viewable in Care Everywhere, but full office note requested (See [facility]  Care Everywhere). Records indicate she was initially seen in 2022 for chest pain and had a stress test and echocardiogram in 06/2020. Stress test was poor quality, with small partially reversible perfusion abnormality in the anterior wall versus artifact. Stress ECG was negative but at submaximal HR. LVEF 55-60%, mild MR on echo. LHC was discussed but never done. Her symptoms resolved after treatment of iron deficiency anemia. She was doing well at her 05/25/2021 follow-up with Dr. Franky Rave. More recently, she was re-evaluated at the same practice by Dr. Gatha Oiler on 11/29/2023. She indicated that she was seen for dyspnea, which has since resolved. Notes suggest she was seen for BLE edema. Otherwise, he wrote, From a cardiovascular standpoint the patient is doing well. The patient denies any exertional chest pain,  left arm or jaw pain, palpitations, diaphoresis, nausea, vomiting, or other anginal symptoms. Likewise, the patient denies any orthopnea, paroxysmal nocturnal dyspnea, syncope, presyncope, claudication, or stroke-like symptoms. LE Venous US  studies ordered as well as an updated TTE.  12/13/2023 LE venous US  showed normal saphenofemoral junction reflux with varicosities in the RLE, no DVT. 12/25/2023 TTE showed LVEF 70 to 75%, no regional wall motion abnormalities, grade 1 diastolic dysfunction, trace MR, normal RV systolic function, trace TR.   Last visit with pulmonologist Dr. Darlean was on 01/23/2024 for upper airway cough syndrome follow-up. She had marked improvement with GERD treatment and weight loss. As needed pulmonology follow-up planned..   A1c 6.1%. She has a Freestyle Libre CGM to LUE. She is on metformin and Mounjaro . Last Mounjaro  02/05/2024.   I will plan to review any new records received from [facility], and if any new information can update my note, otherwise anesthesia team to evaluate on the day of surgery. I reviewed available information with anesthesiologist Erma Simmonds, DO. (UPDATE 02/15/2024 4:31 PM: Additional records received from [facility] only pertained to 12/25/2023 TTE, not her last 11/29/2023 office note.)    VS:  Wt Readings from Last 3 Encounters:  01/30/24 117 kg  01/30/24 117.4 kg  01/23/24 117 kg   BP Readings from Last 3 Encounters:  01/30/24 101/68  01/23/24 103/68  01/16/24 110/77   Pulse Readings from Last 3 Encounters:  01/30/24 76  01/23/24 92  01/16/24 84     PROVIDERS: Marchelle Clem Pitts, MD is PCP  Oiler Gatha, MD is cardiologist ([place]) Darlean Sharper, MD is pulmonologist Lenis Crest, MD is endocrinologist  LABS:  Most recent labs in [facility] include: Lab Results  Component Value Date   WBC 11.4 (H) 01/16/2024   HGB 13.0 01/16/2024   HCT 41.5 01/16/2024   PLT 380 01/16/2024   GLUCOSE 109 (H) 01/25/2024   CHOL 121 01/25/2024    TRIG 113 01/25/2024   HDL 43 01/25/2024   LDLCALC 57 01/25/2024   ALT 29 01/25/2024   AST 25 01/25/2024   NA 140 01/25/2024   K 4.6 01/25/2024   CL 103 01/25/2024   CREATININE 0.79 01/25/2024   BUN 16 01/25/2024   CO2 25 01/25/2024   TSH <0.005 (L) 01/25/2024   HGBA1C 6.1 (A) 01/30/2024     IMAGES: CT Abd/pelvis 01/22/2024: IMPRESSION: 1. No acute findings in the abdomen or pelvis. 2. Previously noted hepatic steatosis appears less prominent on the current study.   1V CXR 04/12/2023: FINDINGS: The heart size and mediastinal contours are within normal limits. Both lungs are clear. The visualized skeletal structures are unremarkable. IMPRESSION: No active disease.    EKG: 11/29/2023 ([facility]; scanned under Media tab): Sinus Rhythm Low voltage in precordial leads   CV: Echo 12/25/2023 ([facility]; [facility]): Conclusions Summary:  1. Left ventricle: The cavity size is normal. Wall thickness is normal. Systolic function is normal. The closest LVEF is 70-75%. Wall motion is normal; there are no regional wall motion abnormalities. Grade I diastolic dysfunction.  2. Aortic valve: There is no stenosis. There is no significant regurgitation.  3. Aortic root: The root is normal-sized.  4. Mitral valve: There is no evidence for stenosis. There is trace regurgitation.  5. Left atrium: The atrium is normal in size.  6. Right ventricle: The cavity size is normal. Systolic function is normal.  7. Pulmonic valve: There is no evidence for stenosis. There is no significant regurgitation.  8. Pulmonary arteries: The tricuspid jet envelope definition is inadequate for estimation of RV systolic pressure.  9. Tricuspid valve: There is no evidence for stenosis. There is trace regurgitation.  10. Right atrium: The atrium is normal in size.  11. Systemic veins: Central venous respirophasic diameter changes are in the normal range (>50%).  12. Pericardium, extracardiac: There is no  pericardial effusion, thickening or mass.    LE Venous Reflux Study 12/13/2023 ([facility]; [facility]): Re: LE edema SUMMARY: 1. Bilateral saphenofemoral junction reflux, as above.  2. Varicosities noted at the right inner thigh/knee region.  3. No deep venous thrombosis.    Adenosine MPI 5/262022 (as summarized in [facility], [facility]): POOR QUALITY STUDY, EF 56%, possible mid-distal LAD infarct with minimal peri-infarct ischemia vs artifact. Stress ECG was negative but at submaximal heart rate. - TTE then showed LVEF 55-60%, trivial MR. She was considered for LHC, but symptoms improved after treatment of her IDA.     Past Medical History:  Diagnosis Date   Anemia    Anxiety    Arthritis    Asthma    Borderline diabetes    Celiac disease    Complication of anesthesia     I have shaking all over when I am waking up from anesthesia   Depression    Diabetes mellitus without complication (HCC)    Dyspnea    Fatty liver    GERD (gastroesophageal reflux disease)    Hemorrhoids    History of hiatal hernia    Hypertension    Hyperthyroidism    Hypertriglyceridemia    PCOS (polycystic ovarian syndrome)    PONV (postoperative nausea and vomiting)    Sleep apnea  Tachycardia     Past Surgical History:  Procedure Laterality Date   APPENDECTOMY  2002   CHOLECYSTECTOMY  11/2022   COLONOSCOPY N/A 06/14/2023   Procedure: COLONOSCOPY;  Surgeon: Shaaron Lamar HERO, MD;  Location: [facility];  Service: Endoscopy;  Laterality: N/A;  815am, asa 3   COLONOSCOPY WITH PROPOFOL  N/A 05/25/2015   Dr.Rourk- non-bleeding hemorrhoids, the examination was o/w normal   ESOPHAGEAL DILATION N/A 05/25/2015   Procedure: ESOPHAGEAL DILATION;  Surgeon: Lamar HERO Shaaron, MD;  Location: [facility];  Service: Endoscopy;  Laterality: N/A;   ESOPHAGOGASTRODUODENOSCOPY  04/2017   [place]: EGD with irregular Z-line s/p biopsy, benign-appearing esophageal stenosis s/p dilation, normal  examined duodenum but no biopsies of duodenum. Pathology with junctional type GE mucosa with chronic inflammation and surface erosion, no metaplasia, negative dysplasia.    ESOPHAGOGASTRODUODENOSCOPY N/A 12/01/2023   Procedure: EGD (ESOPHAGOGASTRODUODENOSCOPY);  Surgeon: Shaaron Lamar HERO, MD;  Location[facility];  Service: Endoscopy;  Laterality: N/A;  9:15 AM, ASA 3   ESOPHAGOGASTRODUODENOSCOPY (EGD) WITH PROPOFOL  N/A 05/25/2015   Dr.Rourk- normal esophagus, dilated, gastric erosions, intact fundoplication, normal third portion of the duodenum, non-bleeding erosive gastropathy. duodenum bx= benign small bowel mucosa with patchy increased intraepithelial lymphocytes, mild villous blunting and mild crypt hyperplasia, no dysplasia or malignancy stomach bx= erosive gastritis with reactive changes   GIVENS CAPSULE STUDY N/A 01/09/2024   Procedure: IMAGING PROCEDURE, GI TRACT, INTRALUMINAL, VIA CAPSULE;  Surgeon: Shaaron Lamar HERO, MD;  Location: [facility];  Service: Endoscopy;  Laterality: N/A;  730am   LIVER BIOPSY N/A 11/11/2022   Procedure: LIVER BIOPSY;  Surgeon: Kallie Manuelita BROCKS, MD;  Location: [facility] ORS;  Service: General;  Laterality: N/A;   NISSEN FUNDOPLICATION  2005    MEDICATIONS:  methylPREDNISolone  acetate (DEPO-MEDROL ) injection 40 mg    albuterol  (PROAIR  HFA) 108 (90 Base) MCG/ACT inhaler   Ascorbic Acid (VITAMIN C ADULT GUMMIES PO)   budesonide -formoterol  (SYMBICORT ) 80-4.5 MCG/ACT inhaler   Calcium  Carb-Cholecalciferol (CALCIUM  1000 + D PO)   Cholecalciferol (VITAMIN D3) 125 MCG (5000 UT) CAPS   ferrous sulfate 325 (65 FE) MG EC tablet   fluticasone (FLONASE) 50 MCG/ACT nasal spray   levothyroxine  (SYNTHROID ) 150 MCG tablet   losartan (COZAAR) 50 MG tablet   metFORMIN (GLUCOPHAGE) 1000 MG tablet   metroNIDAZOLE (METROCREAM) 0.75 % cream   Multiple Vitamin (MULTIVITAMIN) capsule   pantoprazole  (PROTONIX ) 40 MG tablet   PARoxetine (PAXIL) 40 MG tablet   propranolol   (INDERAL ) 20 MG tablet   rosuvastatin  (CRESTOR ) 10 MG tablet   SUMAtriptan (IMITREX) 100 MG tablet   tirzepatide  (MOUNJARO ) 5 MG/0.5ML Pen   topiramate (TOPAMAX) 25 MG tablet   valACYclovir (VALTREX) 1000 MG tablet   Blood Glucose Monitoring Suppl (ACCU-CHEK GUIDE ME) w/Device KIT   Continuous Glucose Receiver (FREESTYLE LIBRE 3 READER) DEVI   Continuous Glucose Sensor (FREESTYLE LIBRE 3 PLUS SENSOR) MISC    Isaiah Ruder, PA-C Surgical Short Stay/Anesthesiology [facility] Phone [phone] [facility] Phone [phone] 02/14/2024 4:38 PM

## 2024-02-15 MED ORDER — BUDESONIDE 3 MG PO CPEP: ORAL_CAPSULE | ORAL | 0 refills | Status: AC

## 2024-02-15 NOTE — Addendum Note (Signed)
 Addended by: SHIRLEAN THERISA ORN on: 02/15/2024 05:11 PM   Modules accepted: Orders

## 2024-02-16 ENCOUNTER — Other Ambulatory Visit: Payer: Self-pay

## 2024-02-16 ENCOUNTER — Ambulatory Visit (HOSPITAL_COMMUNITY): Payer: Self-pay | Admitting: Vascular Surgery

## 2024-02-16 ENCOUNTER — Ambulatory Visit (HOSPITAL_COMMUNITY)
Admission: RE | Admit: 2024-02-16 | Discharge: 2024-02-16 | Disposition: A | Discharge status: Home / Self Care | Attending: Otolaryngology | Admitting: Otolaryngology

## 2024-02-16 ENCOUNTER — Encounter (HOSPITAL_COMMUNITY): Payer: Self-pay | Admitting: Otolaryngology

## 2024-02-16 ENCOUNTER — Encounter (HOSPITAL_COMMUNITY)
Admission: RE | Disposition: A | Discharge status: Home / Self Care | Payer: Self-pay | Source: Home / Self Care | Attending: Otolaryngology

## 2024-02-16 ENCOUNTER — Other Ambulatory Visit: Payer: Self-pay | Admitting: Physician Assistant

## 2024-02-16 ENCOUNTER — Telehealth: Payer: Self-pay | Admitting: Physician Assistant

## 2024-02-16 DIAGNOSIS — E119 Type 2 diabetes mellitus without complications: Secondary | ICD-10-CM | POA: Diagnosis not present

## 2024-02-16 DIAGNOSIS — J343 Hypertrophy of nasal turbinates: Secondary | ICD-10-CM | POA: Diagnosis not present

## 2024-02-16 DIAGNOSIS — E6689 Other obesity not elsewhere classified: Secondary | ICD-10-CM | POA: Diagnosis not present

## 2024-02-16 DIAGNOSIS — J45909 Unspecified asthma, uncomplicated: Secondary | ICD-10-CM | POA: Diagnosis not present

## 2024-02-16 DIAGNOSIS — K219 Gastro-esophageal reflux disease without esophagitis: Secondary | ICD-10-CM | POA: Insufficient documentation

## 2024-02-16 DIAGNOSIS — J3489 Other specified disorders of nose and nasal sinuses: Secondary | ICD-10-CM | POA: Insufficient documentation

## 2024-02-16 DIAGNOSIS — Z6841 Body Mass Index (BMI) 40.0 and over, adult: Secondary | ICD-10-CM | POA: Insufficient documentation

## 2024-02-16 DIAGNOSIS — M1990 Unspecified osteoarthritis, unspecified site: Secondary | ICD-10-CM | POA: Insufficient documentation

## 2024-02-16 DIAGNOSIS — I10 Essential (primary) hypertension: Secondary | ICD-10-CM | POA: Insufficient documentation

## 2024-02-16 DIAGNOSIS — E039 Hypothyroidism, unspecified: Secondary | ICD-10-CM | POA: Insufficient documentation

## 2024-02-16 DIAGNOSIS — J342 Deviated nasal septum: Secondary | ICD-10-CM | POA: Insufficient documentation

## 2024-02-16 DIAGNOSIS — G4730 Sleep apnea, unspecified: Secondary | ICD-10-CM | POA: Insufficient documentation

## 2024-02-16 HISTORY — DX: Anemia, unspecified: D64.9

## 2024-02-16 HISTORY — DX: Personal history of other diseases of the digestive system: Z87.19

## 2024-02-16 HISTORY — DX: Essential (primary) hypertension: I10

## 2024-02-16 HISTORY — DX: Other specified postprocedural states: R11.2

## 2024-02-16 HISTORY — PX: NASAL SEPTOPLASTY W/ TURBINOPLASTY: SHX2070

## 2024-02-16 HISTORY — DX: Other specified postprocedural states: Z98.890

## 2024-02-16 HISTORY — DX: Tachycardia, unspecified: R00.0

## 2024-02-16 HISTORY — DX: Sleep apnea, unspecified: G47.30

## 2024-02-16 LAB — GLUCOSE, CAPILLARY
Glucose-Capillary: 101 mg/dL — ABNORMAL HIGH (ref 70–99)
Glucose-Capillary: 97 mg/dL (ref 70–99)
Glucose-Capillary: 116 mg/dL — ABNORMAL HIGH (ref 70–99)

## 2024-02-16 LAB — POCT PREGNANCY, URINE: Preg Test, Ur: NEGATIVE

## 2024-02-16 MED ORDER — OXYMETAZOLINE HCL 0.05 % NA SOLN
NASAL | Status: AC
  Filled 2024-02-16: qty 30

## 2024-02-16 MED ORDER — OXYCODONE HCL 5 MG/5ML PO SOLN: 5.0000 mg | Freq: Once | ORAL | Status: AC | PRN

## 2024-02-16 MED ORDER — MEPERIDINE HCL 25 MG/ML IJ SOLN: 6.2500 mg | INTRAMUSCULAR | Status: DC | PRN

## 2024-02-16 MED ORDER — FENTANYL CITRATE (PF) 100 MCG/2ML IJ SOLN
INTRAMUSCULAR | Status: AC
  Filled 2024-02-16: qty 2

## 2024-02-16 MED ORDER — LIDOCAINE HCL (CARDIAC) PF 100 MG/5ML IV SOSY
PREFILLED_SYRINGE | INTRAVENOUS | Status: DC | PRN
  Administered 2024-02-16: 80 mg via INTRAVENOUS

## 2024-02-16 MED ORDER — TRAMADOL HCL 50 MG PO TABS: 50.0000 mg | ORAL_TABLET | Freq: Two times a day (BID) | ORAL | 0 refills | Status: AC | PRN

## 2024-02-16 MED ORDER — PROPOFOL 10 MG/ML IV BOLUS
INTRAVENOUS | Status: AC
  Filled 2024-02-16: qty 20

## 2024-02-16 MED ORDER — BACITRACIN ZINC 500 UNIT/GM EX OINT
TOPICAL_OINTMENT | CUTANEOUS | Status: AC
  Filled 2024-02-16: qty 28.35

## 2024-02-16 MED ORDER — BACITRACIN ZINC 500 UNIT/GM EX OINT
TOPICAL_OINTMENT | CUTANEOUS | Status: DC | PRN
  Administered 2024-02-16: 1 via TOPICAL

## 2024-02-16 MED ORDER — FENTANYL CITRATE (PF) 100 MCG/2ML IJ SOLN
25.0000 ug | INTRAMUSCULAR | Status: DC | PRN
  Administered 2024-02-16 (×2): 50 ug via INTRAVENOUS

## 2024-02-16 MED ORDER — IPRATROPIUM-ALBUTEROL 0.5-2.5 (3) MG/3ML IN SOLN
RESPIRATORY_TRACT | Status: AC
  Filled 2024-02-16: qty 3

## 2024-02-16 MED ORDER — PROPOFOL 10 MG/ML IV BOLUS
INTRAVENOUS | Status: DC | PRN
  Administered 2024-02-16: 170 mg via INTRAVENOUS

## 2024-02-16 MED ORDER — MIDAZOLAM HCL (PF) 2 MG/2ML IJ SOLN
2.0000 mg | Freq: Once | INTRAMUSCULAR | Status: AC
  Administered 2024-02-16: 2 mg via INTRAVENOUS
  Filled 2024-02-16: qty 2

## 2024-02-16 MED ORDER — DEXAMETHASONE SOD PHOSPHATE PF 10 MG/ML IJ SOLN
INTRAMUSCULAR | Status: DC | PRN
  Administered 2024-02-16: 10 mg via INTRAVENOUS

## 2024-02-16 MED ORDER — 0.9 % SODIUM CHLORIDE (POUR BTL) OPTIME
TOPICAL | Status: DC | PRN
  Administered 2024-02-16: 1000 mL

## 2024-02-16 MED ORDER — STERILE WATER FOR IRRIGATION IR SOLN
Status: DC | PRN
  Administered 2024-02-16: 1000 mL

## 2024-02-16 MED ORDER — SCOPOLAMINE 1 MG/3DAYS TD PT72
1.0000 | MEDICATED_PATCH | TRANSDERMAL | Status: DC
  Administered 2024-02-16: 1 mg via TRANSDERMAL
  Filled 2024-02-16: qty 1

## 2024-02-16 MED ORDER — PHENYLEPHRINE 80 MCG/ML (10ML) SYRINGE FOR IV PUSH (FOR BLOOD PRESSURE SUPPORT)
PREFILLED_SYRINGE | INTRAVENOUS | Status: DC | PRN
  Administered 2024-02-16: 120 ug via INTRAVENOUS

## 2024-02-16 MED ORDER — ONDANSETRON HCL 4 MG/2ML IJ SOLN
INTRAMUSCULAR | Status: DC | PRN
  Administered 2024-02-16: 4 mg via INTRAVENOUS

## 2024-02-16 MED ORDER — CEFAZOLIN SODIUM-DEXTROSE 2-3 GM-%(50ML) IV SOLR
INTRAVENOUS | Status: DC | PRN
  Administered 2024-02-16: 2 g via INTRAVENOUS

## 2024-02-16 MED ORDER — ONDANSETRON HCL 4 MG/2ML IJ SOLN: 4.0000 mg | Freq: Once | INTRAMUSCULAR | Status: DC | PRN

## 2024-02-16 MED ORDER — OXYCODONE HCL 5 MG PO TABS
ORAL_TABLET | ORAL | Status: AC
  Filled 2024-02-16: qty 1

## 2024-02-16 MED ORDER — OXYCODONE HCL 5 MG PO TABS
5.0000 mg | ORAL_TABLET | Freq: Once | ORAL | Status: AC | PRN
  Administered 2024-02-16: 5 mg via ORAL

## 2024-02-16 MED ORDER — INSULIN ASPART 100 UNIT/ML IJ SOLN: 0.0000 [IU] | INTRAMUSCULAR | Status: DC | PRN

## 2024-02-16 MED ORDER — IPRATROPIUM-ALBUTEROL 0.5-2.5 (3) MG/3ML IN SOLN
3.0000 mL | Freq: Once | RESPIRATORY_TRACT | Status: AC
  Administered 2024-02-16: 3 mL via RESPIRATORY_TRACT

## 2024-02-16 MED ORDER — FENTANYL CITRATE (PF) 250 MCG/5ML IJ SOLN
INTRAMUSCULAR | Status: DC | PRN
  Administered 2024-02-16 (×2): 50 ug via INTRAVENOUS
  Administered 2024-02-16: 100 ug via INTRAVENOUS
  Administered 2024-02-16: 50 ug via INTRAVENOUS

## 2024-02-16 MED ORDER — DEXMEDETOMIDINE HCL IN NACL 80 MCG/20ML IV SOLN
INTRAVENOUS | Status: DC | PRN
  Administered 2024-02-16 (×2): 10 ug via INTRAVENOUS

## 2024-02-16 MED ORDER — MIDAZOLAM HCL (PF) 2 MG/2ML IJ SOLN
INTRAMUSCULAR | Status: DC | PRN
  Administered 2024-02-16: 2 mg via INTRAVENOUS

## 2024-02-16 MED ORDER — ACETAMINOPHEN 500 MG PO TABS
1000.0000 mg | ORAL_TABLET | Freq: Once | ORAL | Status: AC
  Administered 2024-02-16: 1000 mg via ORAL
  Filled 2024-02-16: qty 2

## 2024-02-16 MED ORDER — ROCURONIUM BROMIDE 10 MG/ML (PF) SYRINGE
PREFILLED_SYRINGE | INTRAVENOUS | Status: DC | PRN
  Administered 2024-02-16: 50 mg via INTRAVENOUS

## 2024-02-16 MED ORDER — LIDOCAINE-EPINEPHRINE 1 %-1:100000 IJ SOLN
INTRAMUSCULAR | Status: DC | PRN
  Administered 2024-02-16: 4.5 mL

## 2024-02-16 MED ORDER — MIDAZOLAM HCL 2 MG/2ML IJ SOLN
INTRAMUSCULAR | Status: AC
  Filled 2024-02-16: qty 2

## 2024-02-16 MED ORDER — SUGAMMADEX SODIUM 200 MG/2ML IV SOLN
INTRAVENOUS | Status: DC | PRN
  Administered 2024-02-16: 400 mg via INTRAVENOUS

## 2024-02-16 MED ORDER — FENTANYL CITRATE (PF) 250 MCG/5ML IJ SOLN
INTRAMUSCULAR | Status: AC
  Filled 2024-02-16: qty 5

## 2024-02-16 MED ORDER — CHLORHEXIDINE GLUCONATE 0.12 % MT SOLN
OROMUCOSAL | Status: AC
  Filled 2024-02-16: qty 15

## 2024-02-16 MED ORDER — OXYMETAZOLINE HCL 0.05 % NA SOLN
NASAL | Status: DC | PRN
  Administered 2024-02-16 (×2): 1 via TOPICAL

## 2024-02-16 MED ORDER — LACTATED RINGERS IV SOLN: INTRAVENOUS | Status: DC

## 2024-02-16 MED ORDER — LIDOCAINE-EPINEPHRINE 1 %-1:100000 IJ SOLN
INTRAMUSCULAR | Status: AC
  Filled 2024-02-16: qty 1

## 2024-02-16 NOTE — Anesthesia Postprocedure Evaluation (Signed)
"   Anesthesia Post Note  Patient: Melissa Schmidt  Procedure(s) Performed: SEPTOPLASTY, NOSE, WITH NASAL TURBINATE REDUCTION (Bilateral)     Patient location during evaluation: PACU Anesthesia Type: General Level of consciousness: awake and alert Pain management: pain level controlled Vital Signs Assessment: post-procedure vital signs reviewed and stable Respiratory status: spontaneous breathing, nonlabored ventilation, respiratory function stable and patient connected to nasal cannula oxygen Cardiovascular status: blood pressure returned to baseline and stable Postop Assessment: no apparent nausea or vomiting Anesthetic complications: no   No notable events documented.  Last Vitals:  Vitals:   02/16/24 1540 02/16/24 1545  BP:  123/70  Pulse:  85  Resp:  20  Temp:  36.7 C  SpO2: 97% 100%    Last Pain:  Vitals:   02/16/24 1545  TempSrc:   PainSc: 0-No pain                 Axton Cihlar      "

## 2024-02-16 NOTE — Transfer of Care (Signed)
 Immediate Anesthesia Transfer of Care Note  Patient: KEMBERLY TAVES  Procedure(s) Performed: SEPTOPLASTY, NOSE, WITH NASAL TURBINATE REDUCTION (Bilateral)  Patient Location: PACU  Anesthesia Type:General  Level of Consciousness: awake, alert , and oriented  Airway & Oxygen Therapy: Patient Spontanous Breathing and Patient connected to face mask oxygen  Post-op Assessment: Report given to RN and Post -op Vital signs reviewed and stable  Post vital signs: Reviewed and stable  Last Vitals:  Vitals Value Taken Time  BP    Temp    Pulse 108 02/16/24 14:31  Resp 18 02/16/24 14:31  SpO2 88 % 02/16/24 14:31  Vitals shown include unfiled device data.  Last Pain:  Vitals:   02/16/24 1116  TempSrc:   PainSc: 5       Patients Stated Pain Goal: 2 (02/16/24 1111)  Complications: No notable events documented.

## 2024-02-16 NOTE — H&P (Signed)
 Cc: Chronic nasal obstruction, recurrent sinusitis   HPI: The patient is a 49 year old female who returns today for her follow-up evaluation.  She was last seen in October 2025.  At that time, she was complaining of chronic nasal obstruction and recurrent sinusitis.  She was treated with multiple courses of antibiotics, allergy medications, Flonase nasal spray, and nasal saline irrigation.  Despite the treatment, she continues to be symptomatic.  She recently underwent a sinus CT scan.  The CT showed mild maxillary mucosal edema, likely secondary to extensive dental diseases.  She has since been treated by her dentist.  The CT also showed significant nasal septal deviation and bilateral inferior turbinate hypertrophy.  More than 95% of her nasal passageways are obstructed bilaterally.  The patient returns today complaining of persistent nasal obstruction.  Currently she denies any facial pain, fever, or visual change.   Exam: General: Communicates without difficulty, well nourished, no acute distress. Head: Normocephalic, no evidence injury, no tenderness, facial buttresses intact without stepoff. Face/sinus: No tenderness to palpation and percussion. Facial movement is normal and symmetric. Eyes: PERRL, EOMI. No scleral icterus, conjunctivae clear. Neuro: CN II exam reveals vision grossly intact.  No nystagmus at any point of gaze. Ears: Auricles well formed without lesions.  Ear canals are intact without mass or lesion.  No erythema or edema is appreciated.  The TMs are intact without fluid. Nose: External evaluation reveals normal support and skin without lesions.  Dorsum is intact.  Anterior rhinoscopy reveals congested mucosa over anterior aspect of inferior turbinates and deviated septum.  No purulence noted. Oral:  Oral cavity and oropharynx are intact, symmetric, without erythema or edema.  Mucosa is moist without lesions. Neck: Full range of motion without pain.  There is no significant  lymphadenopathy.  No masses palpable.  Thyroid  bed within normal limits to palpation.  Parotid glands and submandibular glands equal bilaterally without mass.  Trachea is midline. Neuro:  CN 2-12 grossly intact.    Assessment: 1.  Chronic nasal obstruction, secondary to nasal mucosal congestion, nasal septal deviation, and bilateral inferior turbinate hypertrophy.  More than 95% of her nasal passageways are obstructed bilaterally. 2.  Mild mucosal disease is noted within the floor of bilateral maxillary sinuses.  This may be secondary to her extensive dental diseases.  She is currently being treated by her dentist. 3.  Her nasal obstruction has not responded to medical treatment for the past year.   Plan: 1.  The physical exam findings and the CT images are extensively reviewed with the patient. 2.  Continue with Flonase and Zyrtec daily.  Nasal saline irrigation is also encouraged. 3.  Based on the above findings, the patient would benefit from surgical intervention with septoplasty and bilateral inferior turbinate reduction.  The risk, benefits, alternatives, and details of the procedures are extensively reviewed.  Questions are invited and answered. 4.  The patient would like to proceed with the procedures.

## 2024-02-16 NOTE — Anesthesia Procedure Notes (Signed)
 Procedure Name: Intubation Date/Time: 02/16/2024 1:27 PM  Performed by: Cindie Donald CROME, CRNAPre-anesthesia Checklist: Patient identified, Emergency Drugs available, Suction available and Patient being monitored Patient Re-evaluated:Patient Re-evaluated prior to induction Oxygen Delivery Method: Circle System Utilized Preoxygenation: Pre-oxygenation with 100% oxygen Induction Type: IV induction Ventilation: Mask ventilation without difficulty Laryngoscope Size: Mac and 3 Grade View: Grade I Tube type: Oral Tube size: 7.0 mm Number of attempts: 1 Airway Equipment and Method: Stylet Placement Confirmation: ETT inserted through vocal cords under direct vision, positive ETCO2 and breath sounds checked- equal and bilateral Secured at: 22 cm Tube secured with: Tape Dental Injury: Teeth and Oropharynx as per pre-operative assessment

## 2024-02-16 NOTE — Discharge Instructions (Signed)

## 2024-02-16 NOTE — Telephone Encounter (Signed)
 sent

## 2024-02-16 NOTE — Telephone Encounter (Signed)
 Pt called stating she had an appt with Morna on 1/7 and waiting for her to send in tramadol  to [facility] on [address] [place] [facility]. Pt number is [phone].

## 2024-02-16 NOTE — Op Note (Signed)
 DATE OF PROCEDURE: 02/16/2024  OPERATIVE REPORT   SURGEON: Daniel Moccasin, MD   PREOPERATIVE DIAGNOSES:  1. Nasal septal deviation.  2. Bilateral inferior turbinate hypertrophy.  3. Chronic nasal obstruction.  POSTOPERATIVE DIAGNOSES:  1. Nasal septal deviation.  2. Bilateral inferior turbinate hypertrophy.  3. Chronic nasal obstruction.  PROCEDURE PERFORMED:  1. Septoplasty.  2. Bilateral partial inferior turbinate resection.   ANESTHESIA: General endotracheal tube anesthesia.   COMPLICATIONS: None.   ESTIMATED BLOOD LOSS: 100 mL.   INDICATION FOR PROCEDURE: Melissa Schmidt is a 49 y.o. female with a history of chronic nasal obstruction. The patient was treated with antihistamine, decongestant, and steroid nasal sprays. However, the patient continued to be symptomatic. On examination, the patient was noted to have bilateral severe inferior turbinate hypertrophy and significant nasal septal deviation, causing significant nasal obstruction. Based on the above findings, the decision was made for the patient to undergo the above-stated procedures. The risks, benefits, alternatives, and details of the procedures were discussed with the patient. Questions were invited and answered. Informed consent was obtained.   DESCRIPTION OF PROCEDURE: The patient was taken to the operating room and placed supine on the operating table. General endotracheal tube anesthesia was administered by the anesthesiologist. The patient was positioned, and prepped and draped in the standard fashion for nasal surgery. Pledgets soaked with Afrin were placed in both nasal cavities for decongestion. The pledgets were subsequently removed.   Examination of the nasal cavity revealed a nasal septal deviation. 1% lidocaine  with 1:100,000 epinephrine  was injected onto the nasal septum bilaterally. A hemitransfixion incision was made on the left side. The mucosal flap was carefully elevated on the left side. A cartilaginous incision was  made 1 cm superior to the caudal margin of the nasal septum. Mucosal flap was also elevated on the right side in the similar fashion. It should be noted that due to the severe septal deviation, the deviated portion of the cartilaginous and bony septum had to be removed in piecemeal fashion. Once the deviated portions were removed, a straight midline septum was achieved. The septum was then quilted with 4-0 plain gut sutures. The hemitransfixion incision was closed with interrupted 4-0 chromic sutures.   The inferior one half of both hypertrophied inferior turbinate was crossclamped with a Kelly clamp. The inferior one half of each inferior turbinate was then resected with a pair of cross cutting scissors. Hemostasis was achieved with a suction cautery device.  Doyle splints were applied to the nasal septum.  The care of the patient was turned over to the anesthesiologist. The patient was awakened from anesthesia without difficulty. The patient was extubated and transferred to the recovery room in good condition.   OPERATIVE FINDINGS: Nasal septal deviation and bilateral inferior turbinate hypertrophy.   SPECIMEN: None.   FOLLOWUP CARE: The patient be discharged home once she is awake and alert. The patient will follow up in my office in 3 days for splint removal.   Shakema Surita Dois Moccasin, MD

## 2024-02-17 ENCOUNTER — Encounter (HOSPITAL_COMMUNITY): Payer: Self-pay | Admitting: Otolaryngology

## 2024-02-19 ENCOUNTER — Ambulatory Visit (INDEPENDENT_AMBULATORY_CARE_PROVIDER_SITE_OTHER): Admitting: Otolaryngology

## 2024-02-19 ENCOUNTER — Encounter (INDEPENDENT_AMBULATORY_CARE_PROVIDER_SITE_OTHER): Payer: Self-pay | Admitting: Otolaryngology

## 2024-02-19 VITALS — BP 121/81 | HR 88

## 2024-02-19 DIAGNOSIS — Z09 Encounter for follow-up examination after completed treatment for conditions other than malignant neoplasm: Secondary | ICD-10-CM

## 2024-02-19 DIAGNOSIS — J342 Deviated nasal septum: Secondary | ICD-10-CM

## 2024-02-19 NOTE — Progress Notes (Signed)
 Doyle splints removed. Septum and turbinates are healing well.   Both Foss debrided.  Nasal saline irrigation.  Recheck in 3 weeks.

## 2024-02-20 ENCOUNTER — Encounter: Admitting: Orthopaedic Surgery

## 2024-02-21 ENCOUNTER — Telehealth: Payer: Self-pay | Admitting: *Deleted

## 2024-02-21 NOTE — Progress Notes (Signed)
 PA pending review.

## 2024-02-21 NOTE — Addendum Note (Signed)
 Addended by: JEANELL GRAEME RAMAN on: 02/21/2024 08:11 AM   Modules accepted: Orders

## 2024-02-21 NOTE — Telephone Encounter (Signed)
 PA pending review via [facility]md Tracking# [id number]

## 2024-02-21 NOTE — Telephone Encounter (Signed)
 PA approved for MR ABD  [id number] - [id number] DOS: 02/21/2024-04/21/2024  PA approved for MR PELVIS [id number] - [id number] DOS: 02/21/2024-04/21/2024

## 2024-02-22 ENCOUNTER — Encounter: Payer: Self-pay | Admitting: Sports Medicine

## 2024-02-22 ENCOUNTER — Ambulatory Visit: Admitting: Sports Medicine

## 2024-02-22 ENCOUNTER — Other Ambulatory Visit: Payer: Self-pay

## 2024-02-22 DIAGNOSIS — M216X1 Other acquired deformities of right foot: Secondary | ICD-10-CM

## 2024-02-22 DIAGNOSIS — M76829 Posterior tibial tendinitis, unspecified leg: Secondary | ICD-10-CM | POA: Diagnosis not present

## 2024-02-22 DIAGNOSIS — M25571 Pain in right ankle and joints of right foot: Secondary | ICD-10-CM | POA: Diagnosis not present

## 2024-02-22 NOTE — Progress Notes (Signed)
 "  Melissa Schmidt - 49 y.o. female MRN [id number]  Date of birth: [birth year]  Office Visit Note: Visit Date: 02/22/2024 PCP: Marchelle Clem Pitts, MD Referred by: Marchelle Clem Pitts, MD  Subjective: Chief Complaint  Patient presents with   Right Ankle - Pain   HPI: Melissa Schmidt is a pleasant 49 y.o. female who presents today for chronic right ankle pain and PTTD.  She has a chronic right foot and ankle pain, this all started after an injury to the right foot and ankle when she stepped in a hole in her dad's yard.  She has used topical Voltaren  gel, short-term immobilization in a cam walker boot as well as formalized physical therapy with only some relief.  She did have insoles/orthotics given with a metatarsal cookie and arch support.  Her pain continues more so over the medial ankle but does have pain over the dorsum of the foot as well.  Pertinent ROS were reviewed with the patient and found to be negative unless otherwise specified above in HPI.   Assessment & Plan: Visit Diagnoses:  1. PTTD (posterior tibial tendon dysfunction)   2. Loss of transverse plantar arch, right   3. Pain in right ankle and joints of right foot    Plan: Impression is chronic right medial foot and ankle pain with evidence of posterior tibial tendon dysfunction, bedside ultrasound does show tendinosis but no full-thickness or high-grade tearing of the posterior tibial tendon.  She also has pain over the dorsum of the foot which likely is from her transverse arch loss and metatarsalgia.  Through shared decision making, we did proceed with ultrasound-guided PT tendon injection, patient tolerated well.  Advised on postinjection protocol.  She may use ice as well as Tylenol  or topical diclofenac  gel.  I would like her to go back into her cam walker boot for the next 10 days and then transition out of this.  At the 2-week mark, she will restart her physical therapy/home exercises and continue this consistently for the  next month.  I will see her back in 6 weeks for reevaluation.  Would like her to bring in her orthotics/insoles at that visit so I can evaluate her in these.  Follow-up: Return if symptoms worsen or fail to improve.   Meds & Orders: No orders of the defined types were placed in this encounter.   Orders Placed This Encounter  Procedures   US  Guided Needle Placement - No Linked Charges     Procedures: US -guided Posterior Tibial Tendon Sheath Injection, Right ankle: After discussion on risk/benefits/indications, an informed verbal consent was obtained. A timeout was then performed. The patient was lying supine on exam table with affected medial ankle facing upward and leg in frog-leg position with bolster under ankle.  he area overlying the posterior tibial tendon was prepped with ChloraPrep and multiple alcohol swabs.  The ultrasound probe was placed in a short-axis plane just posterior to the medial malleolus where the posterior tibial tendon was identified.  Using ultrasound guidance via an in-plane approach, a 25-gauge, 1.5 needle was inserted from a posterior to anterior direction into the posterior tibial tendon sheath with subsequent injection of 1:1:1cc of lidocaine :bupivicaine:Celestone. Appropriate spread of the injectate within the tendon sheath was visualized with ultrasound guidance. Patient tolerated the procedure well without immediate complications.  A Band-Aid was then applied.         Clinical History:   She reports that she has never smoked. She has never used smokeless  tobacco.  Recent Labs    07/19/23 0000 10/25/23 1454 01/30/24 1440  HGBA1C 11.4 7.8* 6.1*    Objective:   Vital Signs: LMP 11/07/2020   Physical Exam  Gen: Well-appearing, in no acute distress; non-toxic CV: Well-perfused. Warm.  Resp: Breathing unlabored on room air; no wheezing. Psych: Fluid speech in conversation; appropriate affect; normal thought process  Ortho Exam - Right foot/ankle: + TTP  over the posterior aspect of the medial malleolus through the course of the posterior tibial tendon.  The foot in general has pes planus with loss of longitudinal arch with toe splaying between toes 2-3.  There is pain and inability to perform single and bilateral heel raise with poor activation of the posterior tibial tendon.  Imaging:  *Independent review and interpretation of three-view right ankle x-ray from 09/26/2023 was performed by myself today.  X-rays demonstrate minimal to no arthritic change.  There is a intact and congruent ankle mortise.  There is a mild to moderate plantar calcaneal spur.  Otherwise no acute bony abnormality.  Past Medical/Family/Surgical/Social History: Medications & Allergies reviewed per EMR, new medications updated. Patient Active Problem List   Diagnosis Date Noted   Loss of weight 01/16/2024   Chronic maxillary sinusitis 12/27/2023   Impacted cerumen of right ear 11/23/2023   Right-sided tinnitus 11/23/2023   Conductive hearing loss in right ear 11/23/2023   Deviated nasal septum 11/23/2023   Hypertrophy of nasal turbinates 11/23/2023   Chronic pansinusitis 11/23/2023   Upper airway cough syndrome 07/24/2023   Metabolic dysfunction-associated steatohepatitis (MASH) 05/11/2023   DOE (dyspnea on exertion) 05/02/2023   Sinus tachycardia 12/01/2022   Chronic cholecystitis 11/07/2022   Mixed hyperlipidemia 05/16/2022   Morbid obesity (HCC) 04/06/2021   IDA (iron deficiency anemia) 08/20/2020   Vitamin D  deficiency 08/20/2020   Type 2 diabetes mellitus without complication, without long-term current use of insulin  (HCC) 07/21/2020   Iron deficiency anemia 12/31/2019   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   Hemorrhoids 12/11/2018   Graves' disease 11/27/2018   RUQ abdominal pain 10/10/2018   Celiac disease 10/10/2018   GERD (gastroesophageal reflux disease) 10/10/2018   Hyperthyroidism 10/10/2017   Uncontrolled type 2 diabetes  mellitus with hyperglycemia (HCC) 10/10/2017   Mucosal abnormality of stomach    Dysphagia    Positive autoantibody screening for celiac disease 04/29/2015   Fatty liver 03/19/2015   Colicky RUQ abdominal pain 03/19/2015   RLQ abdominal pain 03/19/2015   Rectal bleeding 03/19/2015   Past Medical History:  Diagnosis Date   Anemia    Anxiety    Arthritis    Asthma    Borderline diabetes    Celiac disease    Complication of anesthesia     I have shaking all over when I am waking up from anesthesia   Depression    Diabetes mellitus without complication (HCC)    Dyspnea    Fatty liver    GERD (gastroesophageal reflux disease)    Hemorrhoids    History of hiatal hernia    Hypertension    Hyperthyroidism    Hypertriglyceridemia    PCOS (polycystic ovarian syndrome)    PONV (postoperative nausea and vomiting)    Sleep apnea    Tachycardia    Family History  Problem Relation Age of Onset   Irritable bowel syndrome Mother    Colon polyps Mother        ? 5 year surveillance   Inflammatory bowel disease Neg Hx    Colon cancer Neg Hx  Liver disease Neg Hx    Past Surgical History:  Procedure Laterality Date   APPENDECTOMY  2002   CHOLECYSTECTOMY  11/2022   COLONOSCOPY N/A 06/14/2023   Procedure: COLONOSCOPY;  Surgeon: Shaaron Lamar HERO, MD;  Location: [facility];  Service: Endoscopy;  Laterality: N/A;  815am, asa 3   COLONOSCOPY WITH PROPOFOL  N/A 05/25/2015   Dr.Rourk- non-bleeding hemorrhoids, the examination was o/w normal   ESOPHAGEAL DILATION N/A 05/25/2015   Procedure: ESOPHAGEAL DILATION;  Surgeon: Lamar HERO Shaaron, MD;  Location: [facility];  Service: Endoscopy;  Laterality: N/A;   ESOPHAGOGASTRODUODENOSCOPY  04/2017   [place]: EGD with irregular Z-line s/p biopsy, benign-appearing esophageal stenosis s/p dilation, normal examined duodenum but no biopsies of duodenum. Pathology with junctional type GE mucosa with chronic inflammation and surface erosion, no  metaplasia, negative dysplasia.    ESOPHAGOGASTRODUODENOSCOPY N/A 12/01/2023   Procedure: EGD (ESOPHAGOGASTRODUODENOSCOPY);  Surgeon: Shaaron Lamar HERO, MD;  Location: [facility];  Service: Endoscopy;  Laterality: N/A;  9:15 AM, ASA 3   ESOPHAGOGASTRODUODENOSCOPY (EGD) WITH PROPOFOL  N/A 05/25/2015   Dr.Rourk- normal esophagus, dilated, gastric erosions, intact fundoplication, normal third portion of the duodenum, non-bleeding erosive gastropathy. duodenum bx= benign small bowel mucosa with patchy increased intraepithelial lymphocytes, mild villous blunting and mild crypt hyperplasia, no dysplasia or malignancy stomach bx= erosive gastritis with reactive changes   GIVENS CAPSULE STUDY N/A 01/09/2024   Procedure: IMAGING PROCEDURE, GI TRACT, INTRALUMINAL, VIA CAPSULE;  Surgeon: Shaaron Lamar HERO, MD;  Location: [facility];  Service: Endoscopy;  Laterality: N/A;  730am   LIVER BIOPSY N/A 11/11/2022   Procedure: LIVER BIOPSY;  Surgeon: Kallie Manuelita BROCKS, MD;  Location: [facility];  Service: General;  Laterality: N/A;   NASAL SEPTOPLASTY W/ TURBINOPLASTY Bilateral 02/16/2024   Procedure: SEPTOPLASTY, NOSE, WITH NASAL TURBINATE REDUCTION;  Surgeon: Karis Clunes, MD;  Location: [facility];  Service: ENT;  Laterality: Bilateral;   NISSEN FUNDOPLICATION  2005   Social History   Occupational History   Occupation: in school  Tobacco Use   Smoking status: Never   Smokeless tobacco: Never  Vaping Use   Vaping status: Never Used  Substance and Sexual Activity   Alcohol use: No    Alcohol/week: 0.0 standard drinks of alcohol   Drug use: No   Sexual activity: Yes    Birth control/protection: None   "

## 2024-02-23 ENCOUNTER — Other Ambulatory Visit: Payer: Self-pay | Admitting: Gastroenterology

## 2024-02-23 MED ORDER — DIAZEPAM 2 MG PO TABS: ORAL_TABLET | ORAL | 0 refills | Status: AC

## 2024-02-24 ENCOUNTER — Ambulatory Visit (HOSPITAL_COMMUNITY)

## 2024-02-24 ENCOUNTER — Encounter (HOSPITAL_COMMUNITY): Payer: Self-pay

## 2024-02-26 NOTE — Telephone Encounter (Signed)
 noted

## 2024-02-26 NOTE — Telephone Encounter (Signed)
 Melissa Schmidt  This is what the pt sent back. Please advise

## 2024-02-27 ENCOUNTER — Ambulatory Visit (HOSPITAL_COMMUNITY): Admission: RE | Admit: 2024-02-27 | Discharge: 2024-02-27 | Attending: Gastroenterology | Admitting: Gastroenterology

## 2024-02-27 ENCOUNTER — Ambulatory Visit (HOSPITAL_COMMUNITY)
Admission: RE | Admit: 2024-02-27 | Discharge: 2024-02-27 | Disposition: A | Discharge status: Home / Self Care | Source: Ambulatory Visit | Attending: Gastroenterology | Admitting: Gastroenterology

## 2024-02-27 DIAGNOSIS — R1031 Right lower quadrant pain: Secondary | ICD-10-CM | POA: Insufficient documentation

## 2024-02-27 DIAGNOSIS — D500 Iron deficiency anemia secondary to blood loss (chronic): Secondary | ICD-10-CM

## 2024-02-27 MED ORDER — GADOBUTROL 1 MMOL/ML IV SOLN
10.0000 mL | Freq: Once | INTRAVENOUS | Status: AC | PRN
  Administered 2024-02-27: 10 mL via INTRAVENOUS

## 2024-02-29 ENCOUNTER — Emergency Department (HOSPITAL_COMMUNITY): Admission: EM | Admit: 2024-02-29 | Discharge: 2024-02-29 | Disposition: A | Discharge status: Home / Self Care

## 2024-02-29 ENCOUNTER — Other Ambulatory Visit: Payer: Self-pay

## 2024-02-29 ENCOUNTER — Emergency Department (HOSPITAL_COMMUNITY)

## 2024-02-29 ENCOUNTER — Encounter (HOSPITAL_COMMUNITY): Payer: Self-pay | Admitting: *Deleted

## 2024-02-29 DIAGNOSIS — J101 Influenza due to other identified influenza virus with other respiratory manifestations: Secondary | ICD-10-CM | POA: Insufficient documentation

## 2024-02-29 DIAGNOSIS — J111 Influenza due to unidentified influenza virus with other respiratory manifestations: Secondary | ICD-10-CM

## 2024-02-29 DIAGNOSIS — R509 Fever, unspecified: Secondary | ICD-10-CM | POA: Diagnosis present

## 2024-02-29 LAB — URINALYSIS, W/ REFLEX TO CULTURE (INFECTION SUSPECTED)
Bilirubin Urine: NEGATIVE
Glucose, UA: NEGATIVE mg/dL
Hgb urine dipstick: NEGATIVE
Ketones, ur: NEGATIVE mg/dL
Leukocytes,Ua: NEGATIVE
Nitrite: NEGATIVE
Protein, ur: NEGATIVE mg/dL
Specific Gravity, Urine: 1.008 (ref 1.005–1.030)
pH: 8 (ref 5.0–8.0)

## 2024-02-29 LAB — CBG MONITORING, ED: Glucose-Capillary: 125 mg/dL — ABNORMAL HIGH (ref 70–99)

## 2024-02-29 LAB — COMPREHENSIVE METABOLIC PANEL WITH GFR
ALT: 27 U/L (ref 0–44)
AST: 25 U/L (ref 15–41)
Albumin: 4.2 g/dL (ref 3.5–5.0)
Alkaline Phosphatase: 87 U/L (ref 38–126)
Anion gap: 15 (ref 5–15)
BUN: 9 mg/dL (ref 6–20)
CO2: 22 mmol/L (ref 22–32)
Calcium: 9.4 mg/dL (ref 8.9–10.3)
Chloride: 101 mmol/L (ref 98–111)
Creatinine, Ser: 0.86 mg/dL (ref 0.44–1.00)
GFR, Estimated: >60 mL/min
Glucose, Bld: 120 mg/dL — ABNORMAL HIGH (ref 70–99)
Potassium: 3.8 mmol/L (ref 3.5–5.1)
Sodium: 137 mmol/L (ref 135–145)
Total Bilirubin: 0.3 mg/dL (ref 0.0–1.2)
Total Protein: 7.6 g/dL (ref 6.5–8.1)

## 2024-02-29 LAB — RESP PANEL BY RT-PCR (RSV, FLU A&B, COVID)  RVPGX2
Influenza A by PCR: POSITIVE — AB
Influenza B by PCR: NEGATIVE
Resp Syncytial Virus by PCR: NEGATIVE
SARS Coronavirus 2 by RT PCR: NEGATIVE

## 2024-02-29 LAB — PROTIME-INR
INR: 1 (ref 0.8–1.2)
Prothrombin Time: 13.3 s (ref 11.4–15.2)

## 2024-02-29 LAB — CBC WITH DIFFERENTIAL/PLATELET
Abs Immature Granulocytes: 0.02 K/uL (ref 0.00–0.07)
Basophils Absolute: 0.1 K/uL (ref 0.0–0.1)
Basophils Relative: 1 %
Eosinophils Absolute: 0.1 K/uL (ref 0.0–0.5)
Eosinophils Relative: 1 %
HCT: 42.6 % (ref 36.0–46.0)
Hemoglobin: 13.3 g/dL (ref 12.0–15.0)
Immature Granulocytes: 0 %
Lymphocytes Relative: 7 %
Lymphs Abs: 0.7 K/uL (ref 0.7–4.0)
MCH: 25.1 pg — ABNORMAL LOW (ref 26.0–34.0)
MCHC: 31.2 g/dL (ref 30.0–36.0)
MCV: 80.5 fL (ref 80.0–100.0)
Monocytes Absolute: 1.1 K/uL — ABNORMAL HIGH (ref 0.1–1.0)
Monocytes Relative: 11 %
Neutro Abs: 8.2 K/uL — ABNORMAL HIGH (ref 1.7–7.7)
Neutrophils Relative %: 80 %
Platelets: 369 K/uL (ref 150–400)
RBC: 5.29 MIL/uL — ABNORMAL HIGH (ref 3.87–5.11)
RDW: 15.8 % — ABNORMAL HIGH (ref 11.5–15.5)
WBC: 10.1 K/uL (ref 4.0–10.5)
nRBC: 0 % (ref 0.0–0.2)

## 2024-02-29 LAB — LACTIC ACID, PLASMA: Lactic Acid, Venous: 1.3 mmol/L (ref 0.5–1.9)

## 2024-02-29 MED ORDER — SODIUM CHLORIDE 0.9 % IV SOLN
1.0000 g | Freq: Once | INTRAVENOUS | Status: AC
  Administered 2024-02-29: 1 g via INTRAVENOUS
  Filled 2024-02-29: qty 10

## 2024-02-29 MED ORDER — LACTATED RINGERS IV BOLUS (SEPSIS)
1000.0000 mL | Freq: Once | INTRAVENOUS | Status: AC
  Administered 2024-02-29: 1000 mL via INTRAVENOUS

## 2024-02-29 MED ORDER — BENZONATATE 200 MG PO CAPS: 200.0000 mg | ORAL_CAPSULE | Freq: Three times a day (TID) | ORAL | 0 refills | Status: AC | PRN

## 2024-02-29 MED ORDER — OSELTAMIVIR PHOSPHATE 75 MG PO CAPS: 75.0000 mg | ORAL_CAPSULE | Freq: Two times a day (BID) | ORAL | 0 refills | Status: AC

## 2024-02-29 MED ORDER — LACTATED RINGERS IV SOLN: INTRAVENOUS | Status: DC

## 2024-02-29 MED ORDER — DIPHENHYDRAMINE HCL 50 MG/ML IJ SOLN
25.0000 mg | Freq: Once | INTRAMUSCULAR | Status: AC
  Administered 2024-02-29: 25 mg via INTRAVENOUS
  Filled 2024-02-29: qty 1

## 2024-02-29 MED ORDER — KETOROLAC TROMETHAMINE 15 MG/ML IJ SOLN
15.0000 mg | Freq: Once | INTRAMUSCULAR | Status: AC
  Administered 2024-02-29: 15 mg via INTRAVENOUS
  Filled 2024-02-29: qty 1

## 2024-02-29 MED ORDER — LACTATED RINGERS IV BOLUS (SEPSIS)
600.0000 mL | Freq: Once | INTRAVENOUS | Status: AC
  Administered 2024-02-29: 600 mL via INTRAVENOUS

## 2024-02-29 MED ORDER — ACETAMINOPHEN 325 MG PO TABS
650.0000 mg | ORAL_TABLET | Freq: Once | ORAL | Status: AC
  Administered 2024-02-29: 650 mg via ORAL
  Filled 2024-02-29: qty 2

## 2024-02-29 MED ORDER — PROCHLORPERAZINE EDISYLATE 10 MG/2ML IJ SOLN
10.0000 mg | Freq: Once | INTRAMUSCULAR | Status: AC
  Administered 2024-02-29: 10 mg via INTRAVENOUS
  Filled 2024-02-29: qty 2

## 2024-02-29 NOTE — ED Triage Notes (Signed)
 Pt exposed to someone with flu A recently, symptoms started on Tuesday-sneezing , coughing, body aches, noted elevated HR 134 at home.  + fever + HA +heartburn

## 2024-02-29 NOTE — ED Provider Notes (Signed)
 " [facility] EMERGENCY DEPARTMENT AT [facility] Provider Note   CSN: [id number] Arrival date & time: 02/29/24  1529     Patient presents with: Influenza   Melissa Schmidt is a 49 y.o. female.   Patient is a 49 year old female who presents the emergency department the chief complaint of headache, cough, congestion, fever, chills, body aches which has been ongoing for approximate the past 2 days.  She notes that she has had a positive to influenza A.  She denies any pain to her neck or back.  Patient denies any dysuria or hematuria.  She has had no abdominal pain, nausea or vomiting.  She did have some diarrhea 2 days ago but this has resolved.  She admits to a lack of p.o. intake.   Influenza Presenting symptoms: cough, fatigue and headache        Prior to Admission medications  Medication Sig Start Date End Date Taking? Authorizing Provider  albuterol  (PROAIR  HFA) 108 (90 Base) MCG/ACT inhaler 2 puffs every 4 hours as needed only  if your can't catch your breath 05/02/23   Darlean Ozell NOVAK, MD  Ascorbic Acid (VITAMIN C ADULT GUMMIES PO) Take 2 tablets by mouth daily.    [provider]  Blood Glucose Monitoring Suppl (ACCU-CHEK GUIDE ME) w/Device KIT Use to check blood glucose four times daily as directed. 07/26/23   Nida, Gebreselassie W, MD  budesonide  (ENTOCORT EC ) 3 MG 24 hr capsule Take 3 capsules (9 mg total) by mouth daily for 30 days, THEN 2 capsules (6 mg total) daily for 14 days, THEN 1 capsule (3 mg total) daily for 14 days. 02/15/24 04/13/24  Shirlean Therisa ORN, NP  budesonide -formoterol  (SYMBICORT ) 80-4.5 MCG/ACT inhaler Take 2 puffs first thing in am and then another 2 puffs about 12 hours later. 05/02/23   Darlean Ozell NOVAK, MD  Calcium  Carb-Cholecalciferol (CALCIUM  1000 + D PO) Take 1 tablet by mouth daily.    [provider]  Cholecalciferol (VITAMIN D3) 125 MCG (5000 UT) CAPS Take 1 capsule (5,000 Units total) by mouth daily. 12/03/20   Nida, Gebreselassie W,  MD  Continuous Glucose Receiver (FREESTYLE LIBRE 3 READER) DEVI 1 Piece by Does not apply route once as needed for up to 1 dose. 07/25/23   Nida, Gebreselassie W, MD  Continuous Glucose Sensor (FREESTYLE LIBRE 3 PLUS SENSOR) MISC Change sensor every 15 days. 01/30/24   Nida, Gebreselassie W, MD  diazepam  (VALIUM ) 2 MG tablet Take 1 tablet by mouth one hour prior to MRI and repeat only if needed just prior to MRI. Do not drive if taking. 02/23/24   Kennedy Charmaine CROME, NP  ferrous sulfate 325 (65 FE) MG EC tablet Take 325 mg by mouth daily with breakfast.    [provider]  levothyroxine  (SYNTHROID ) 150 MCG tablet Take 1 tablet (150 mcg total) by mouth daily before breakfast. 01/30/24   Nida, Gebreselassie W, MD  losartan (COZAAR) 50 MG tablet Take 50 mg by mouth daily. 04/19/23   [provider]  metFORMIN (GLUCOPHAGE) 1000 MG tablet Take 1,000 mg by mouth 2 (two) times daily with a meal.     [provider]  metroNIDAZOLE (METROCREAM) 0.75 % cream Apply 1 Application topically 2 (two) times daily as needed (rosacea). 12/11/23   [provider]  Multiple Vitamin (MULTIVITAMIN) capsule Take 1 capsule by mouth daily.    [provider]  pantoprazole  (PROTONIX ) 40 MG tablet Take 1 tablet (40 mg total) by mouth 2 (  two) times daily before a meal. 11/14/23   Shirlean Therisa ORN, NP  PARoxetine (PAXIL) 40 MG tablet Take 40 mg by mouth every morning. 11/23/23   [provider]  propranolol  (INDERAL ) 20 MG tablet Take 10 mg by mouth every 6 (six) hours as needed (anxiety). 09/28/22   [provider]  rosuvastatin  (CRESTOR ) 10 MG tablet Take 10 mg by mouth at bedtime.    [provider]  SUMAtriptan (IMITREX) 100 MG tablet Take 100 mg by mouth every 2 (two) hours as needed for migraine.    [provider]  tirzepatide  (MOUNJARO ) 5 MG/0.5ML Pen Inject 5 mg into the skin once a week. 10/25/23   Nida, Gebreselassie W, MD  topiramate (TOPAMAX) 25  MG tablet Take 100 mg by mouth daily.    [provider]  traMADol  (ULTRAM ) 50 MG tablet Take 1 tablet (50 mg total) by mouth 2 (two) times daily as needed. 02/16/24   Jule Ronal CROME, PA-C  valACYclovir (VALTREX) 1000 MG tablet Take 1,000 mg by mouth daily as needed (outbreaks). 10/12/23   [provider]    Allergies: Doxycycline and Singulair [montelukast]    Review of Systems  Constitutional:  Positive for fatigue.  Respiratory:  Positive for cough.   Neurological:  Positive for headaches.  All other systems reviewed and are negative.   Updated Vital Signs BP 129/86 (BP Location: Right Arm)   Pulse (!) 119   Temp 98.8 F (37.1 C)   Resp 20   Ht 5' 3 (1.6 m)   Wt 109.8 kg   SpO2 97%   BMI 42.87 kg/m   Physical Exam Vitals and nursing note reviewed.  Constitutional:      General: She is not in acute distress.    Appearance: Normal appearance. She is not ill-appearing.  HENT:     Head: Normocephalic and atraumatic.     Nose: Nose normal.     Mouth/Throat:     Mouth: Mucous membranes are moist.  Eyes:     Extraocular Movements: Extraocular movements intact.     Conjunctiva/sclera: Conjunctivae normal.     Pupils: Pupils are equal, round, and reactive to light.  Cardiovascular:     Rate and Rhythm: Regular rhythm. Tachycardia present.     Pulses: Normal pulses.     Heart sounds: Normal heart sounds. No murmur heard.    No gallop.  Pulmonary:     Effort: Pulmonary effort is normal. No respiratory distress.     Breath sounds: Normal breath sounds. No stridor. No wheezing, rhonchi or rales.  Abdominal:     General: Abdomen is flat. Bowel sounds are normal. There is no distension.     Palpations: Abdomen is soft.     Tenderness: There is no abdominal tenderness. There is no guarding.  Musculoskeletal:        General: Normal range of motion.     Cervical back: Normal range of motion and neck supple. No rigidity or tenderness.  Skin:    General: Skin  is warm and dry.  Neurological:     General: No focal deficit present.     Mental Status: She is alert and oriented to person, place, and time. Mental status is at baseline.  Psychiatric:        Mood and Affect: Mood normal.        Behavior: Behavior normal.        Thought Content: Thought content normal.        Judgment: Judgment  normal.     (all labs ordered are listed, but only abnormal results are displayed) Labs Reviewed  CBG MONITORING, ED - Abnormal; Notable for the following components:      Result Value   Glucose-Capillary 125 (*)    All other components within normal limits  RESP PANEL BY RT-PCR (RSV, FLU A&B, COVID)  RVPGX2  CULTURE, BLOOD (ROUTINE X 2)  CULTURE, BLOOD (ROUTINE X 2)  LACTIC ACID, PLASMA  LACTIC ACID, PLASMA  COMPREHENSIVE METABOLIC PANEL WITH GFR  CBC WITH DIFFERENTIAL/PLATELET  PROTIME-INR  URINALYSIS, W/ REFLEX TO CULTURE (INFECTION SUSPECTED)  POC URINE PREG, ED    EKG: None  Radiology: MR ENTERO PELVIS W WO CONTRAST Result Date: 02/28/2024 CLINICAL DATA:  MR enterography due to suspected Crohn's disease. EXAM: MR ABDOMEN AND PELVIS WITHOUT AND WITH CONTRAST (MR ENTEROGRAPHY) TECHNIQUE: Multiplanar, multisequence MRI of the abdomen and pelvis was performed both before and during bolus administration of intravenous contrast. Negative oral contrast VoLumen was given. CONTRAST:  10mL GADAVIST  GADOBUTROL  1 MMOL/ML IV SOLN COMPARISON:  CT scan abdomen and pelvis from 01/22/2024. FINDINGS: COMBINED FINDINGS FOR BOTH MR ABDOMEN AND PELVIS Lower chest: Unremarkable MR appearance to the lung bases. No pleural effusion. No pericardial effusion. Normal heart size. Hepatobiliary: The liver is moderately enlarged in size measuring up to 20 cm in length. Noncirrhotic configuration. No focal liver lesion. Can not assess degree of hepatic steatosis due to lack of in and out of phase images. No intrahepatic or extrahepatic bile duct dilatation. No choledocholithiasis.  Status post cholecystectomy. Pancreas: No mass, inflammatory changes or other parenchymal abnormality identified. No main pancreatic duct dilation. Spleen:  Within normal limits in size and appearance. No focal mass. Adrenals/Urinary Tract: Unremarkable adrenal glands. No hydroureteronephrosis. No suspicious renal mass. Stomach/Bowel: There is a small sliding hiatal hernia. No disproportionate dilation of small or large bowel loops. No abnormal bowel wall thickening. No mucosal hyperenhancement. No perienteric fat stranding. Normal appearance of terminal ileum. Appendix is surgically absent. Vascular/Lymphatic: No pathologically enlarged lymph nodes identified. No abdominal aortic aneurysm demonstrated. No ascites. Reproductive: Normal-sized uterus containing a T-shaped intrauterine device. Bilateral ovaries are within normal limits. A dominant follicle noted in the left ovary measuring up to 2.0 x 2.1 cm. Other:  None. Musculoskeletal: No suspicious bone lesions identified. IMPRESSION: 1. No evidence of active inflammatory bowel disease. Unremarkable terminal ileum. No abnormal bowel wall thickening or mucosal hyperenhancement. 2. No acute inflammatory process identified within the abdomen or pelvis. 3. Moderate hepatomegaly. 4. Otherwise essentially unremarkable exam, as described above. Electronically Signed   By: Ree Molt M.D.   On: 02/28/2024 08:33   MR ENTERO ABDOMEN W WO CONTRAST Result Date: 02/28/2024 CLINICAL DATA:  MR enterography due to suspected Crohn's disease. EXAM: MR ABDOMEN AND PELVIS WITHOUT AND WITH CONTRAST (MR ENTEROGRAPHY) TECHNIQUE: Multiplanar, multisequence MRI of the abdomen and pelvis was performed both before and during bolus administration of intravenous contrast. Negative oral contrast VoLumen was given. CONTRAST:  10mL GADAVIST  GADOBUTROL  1 MMOL/ML IV SOLN COMPARISON:  CT scan abdomen and pelvis from 01/22/2024. FINDINGS: COMBINED FINDINGS FOR BOTH MR ABDOMEN AND PELVIS Lower  chest: Unremarkable MR appearance to the lung bases. No pleural effusion. No pericardial effusion. Normal heart size. Hepatobiliary: The liver is moderately enlarged in size measuring up to 20 cm in length. Noncirrhotic configuration. No focal liver lesion. Can not assess degree of hepatic steatosis due to lack of in and out of phase images. No intrahepatic or extrahepatic bile duct dilatation. No choledocholithiasis.  Status post cholecystectomy. Pancreas: No mass, inflammatory changes or other parenchymal abnormality identified. No main pancreatic duct dilation. Spleen:  Within normal limits in size and appearance. No focal mass. Adrenals/Urinary Tract: Unremarkable adrenal glands. No hydroureteronephrosis. No suspicious renal mass. Stomach/Bowel: There is a small sliding hiatal hernia. No disproportionate dilation of small or large bowel loops. No abnormal bowel wall thickening. No mucosal hyperenhancement. No perienteric fat stranding. Normal appearance of terminal ileum. Appendix is surgically absent. Vascular/Lymphatic: No pathologically enlarged lymph nodes identified. No abdominal aortic aneurysm demonstrated. No ascites. Reproductive: Normal-sized uterus containing a T-shaped intrauterine device. Bilateral ovaries are within normal limits. A dominant follicle noted in the left ovary measuring up to 2.0 x 2.1 cm. Other:  None. Musculoskeletal: No suspicious bone lesions identified. IMPRESSION: 1. No evidence of active inflammatory bowel disease. Unremarkable terminal ileum. No abnormal bowel wall thickening or mucosal hyperenhancement. 2. No acute inflammatory process identified within the abdomen or pelvis. 3. Moderate hepatomegaly. 4. Otherwise essentially unremarkable exam, as described above. Electronically Signed   By: Ree Molt M.D.   On: 02/28/2024 08:33     Procedures   Medications Ordered in the ED  lactated ringers  infusion (has no administration in time range)  lactated ringers  bolus  1,000 mL (has no administration in time range)    And  lactated ringers  bolus 600 mL (has no administration in time range)  acetaminophen  (TYLENOL ) tablet 650 mg (has no administration in time range)  ketorolac  (TORADOL ) 15 MG/ML injection 15 mg (has no administration in time range)  cefTRIAXone  (ROCEPHIN ) 1 g in sodium chloride  0.9 % 100 mL IVPB (has no administration in time range)                                    Medical Decision Making Amount and/or Complexity of Data Reviewed Labs: ordered. Radiology: ordered.  Risk OTC drugs. Prescription drug management.   This patient presents to the ED for concern of fever, chills, headache, cough, congestion, rhinorrhea differential diagnosis includes acute viral syndrome, influenza, COVID-19, RSV, pneumonia, sepsis, urinary tract infection    Additional history obtained:  Additional history obtained from none External records from outside source obtained and reviewed including none   Lab Tests:  I Ordered, and personally interpreted labs.  The pertinent results include: No leukocytosis, no anemia, normal kidney function liver function, unremarkable electrolytes, negative lactic acid, unremarkable urinalysis, positive for influenza A   Imaging Studies ordered:  I ordered imaging studies including chest x-ray I independently visualized and interpreted imaging which showed no acute cardiopulmonary process I agree with the radiologist interpretation   Medicines ordered and prescription drug management:  I ordered medication including IV fluids, Compazine , Benadryl , Tylenol , Toradol , Rocephin  for possible sepsis, headache Reevaluation of the patient after these medicines showed that the patient improved I have reviewed the patients home medicines and have made adjustments as needed   Problem List / ED Course:  Patient is doing much better at this time.  Hypotension has resolved and tachycardia has resolved as well as heart  rate was within the 90s on my last evaluation.  She has positive for influenza A.  Blood work is otherwise been unremarkable.  Chest x-ray demonstrated no signs of acute consolidation.  Do suspect that symptoms are secondary to dehydration and influenza.  She was initially given Rocephin  as she was meeting sepsis criteria on presentation though this does appear to be secondary  to influenza and do not suspect antibiotics are warranted.  Will sign patient out to [facility], PA-C at this time pending reevaluation and orthostatic vital signs.  Patient should be stable for discharge home if orthostatics are appropriate.   Social Determinants of Health:  None        Final diagnoses:  None    ED Discharge Orders     None          Melissa Schmidt 02/29/24 1908    Simon Melissa SAILOR, MD 02/29/24 2102  "

## 2024-02-29 NOTE — Sepsis Progress Note (Signed)
 Elink following code sepsis  Bedside RN confirmed that [facility] were drawn prior to ABX started

## 2024-02-29 NOTE — ED Notes (Addendum)
 Pt had LR running in the right forearm to gravity. Same was placed on a pump. At this time. KCM~

## 2024-02-29 NOTE — Discharge Instructions (Signed)
 You tested positive this evening for influenza.  It is very important for the next several days that you drink plenty of fluids.  Please take Tylenol  every 4 hours to help body aches and fever.  Start the Tamiflu  is soon as possible.  Take it as directed until finished.  You have also been prescribed medication to help with your cough, please follow-up with your primary care provider for recheck or return to the emergency department for any new or worsening symptoms

## 2024-02-29 NOTE — ED Provider Notes (Signed)
" ° °  Signed out to me by Lonni Conger, PA-C pending patient reevaluation and orthostatic vital signs.  Patient initially arrived with flulike symptoms and was found to be tachycardic and hypotensive.  Evolving sepsis protocol initiated, work up showed positive influenza A.  She has received IV fluids and has responded nicely.  Orthostatic vital signs were obtained, patient asymptomatic upon standing.  Will trial oral fluids.    Orthostatic VS - Lying from 02/28/24 1930 to 02/29/24 1930  Date/Time BP- Lying Pulse- Lying User  02/29/24 1857 108/59 97 SGM   Orthostatic VS - Sitting from 02/28/24 1930 to 02/29/24 1930  Date/Time BP- Sitting Pulse- Sitting Who  02/29/24 1857 99/58 99 SGM   Orthostatic VS - Standing from 02/28/24 1930 to 02/29/24 1930  Date/Time BP- Standing at 0 minutes Pulse- Standing at 0 minutes Who  02/29/24 1857 102/67 111 SGM         Patient has had additional IV fluids and oral fluids.  Ambulated to the bathroom without difficulty.  Reports that she feels much better and would like to go home.  I feel this is reasonable.  She was given strict return precautions and agrees to plan.      Herlinda Milling, PA-C 02/29/24 2050    Simon Lavonia SAILOR, MD 02/29/24 2102  "

## 2024-03-02 MED ORDER — SUCRALFATE 1 GM/10ML PO SUSP: 1.0000 g | Freq: Three times a day (TID) | ORAL | 0 refills | Status: AC

## 2024-03-02 NOTE — Addendum Note (Signed)
 Addended by: SHIRLEAN THERISA ORN on: 03/02/2024 03:00 PM   Modules accepted: Orders

## 2024-03-05 ENCOUNTER — Telehealth: Payer: Self-pay

## 2024-03-05 LAB — CULTURE, BLOOD (ROUTINE X 2): Culture: NO GROWTH

## 2024-03-05 MED ORDER — SUCRALFATE 1 G PO TABS: 1.0000 g | ORAL_TABLET | Freq: Three times a day (TID) | ORAL | 0 refills | Status: AC

## 2024-03-05 NOTE — Telephone Encounter (Signed)
 Noted. I sent prescription electronically. Thanks!

## 2024-03-05 NOTE — Addendum Note (Signed)
 Addended by: SHIRLEAN THERISA ORN on: 03/05/2024 01:13 PM   Modules accepted: Orders

## 2024-03-05 NOTE — Telephone Encounter (Signed)
 noted

## 2024-03-05 NOTE — ED Notes (Signed)
 Lab called to state pt had + BC in 1 aerobic bottle with gram + rods  Dr. Freddi notified and no new orders at this time

## 2024-03-05 NOTE — Telephone Encounter (Signed)
 Melissa Schmidt,   Documentation from [facility] advising that the pt is willing to take tablets to avoid PA process on your desk. Please sign and date if agree.

## 2024-03-06 ENCOUNTER — Inpatient Hospital Stay: Admitting: Family

## 2024-03-06 ENCOUNTER — Inpatient Hospital Stay

## 2024-03-07 LAB — CULTURE, BLOOD (ROUTINE X 2)
Culture  Setup Time: NO GROWTH
Report Status: NO GROWTH — AB
Special Requests: ADEQUATE

## 2024-03-08 ENCOUNTER — Telehealth (HOSPITAL_BASED_OUTPATIENT_CLINIC_OR_DEPARTMENT_OTHER): Payer: Self-pay

## 2024-03-08 ENCOUNTER — Emergency Department (HOSPITAL_COMMUNITY)
Admission: EM | Admit: 2024-03-08 | Discharge: 2024-03-09 | Disposition: A | Attending: Emergency Medicine | Admitting: Emergency Medicine

## 2024-03-08 DIAGNOSIS — E039 Hypothyroidism, unspecified: Secondary | ICD-10-CM | POA: Insufficient documentation

## 2024-03-08 DIAGNOSIS — E119 Type 2 diabetes mellitus without complications: Secondary | ICD-10-CM | POA: Diagnosis not present

## 2024-03-08 DIAGNOSIS — Z79899 Other long term (current) drug therapy: Secondary | ICD-10-CM | POA: Insufficient documentation

## 2024-03-08 DIAGNOSIS — R7881 Bacteremia: Secondary | ICD-10-CM

## 2024-03-08 DIAGNOSIS — R519 Headache, unspecified: Secondary | ICD-10-CM | POA: Diagnosis present

## 2024-03-08 DIAGNOSIS — D72829 Elevated white blood cell count, unspecified: Secondary | ICD-10-CM | POA: Diagnosis not present

## 2024-03-08 DIAGNOSIS — R058 Other specified cough: Secondary | ICD-10-CM | POA: Diagnosis not present

## 2024-03-08 DIAGNOSIS — Z7984 Long term (current) use of oral hypoglycemic drugs: Secondary | ICD-10-CM | POA: Insufficient documentation

## 2024-03-08 LAB — URINALYSIS, ROUTINE W REFLEX MICROSCOPIC
Bilirubin Urine: NEGATIVE
Glucose, UA: NEGATIVE mg/dL
Hgb urine dipstick: NEGATIVE
Ketones, ur: NEGATIVE mg/dL
Nitrite: NEGATIVE
Protein, ur: NEGATIVE mg/dL
Specific Gravity, Urine: 1.006 (ref 1.005–1.030)
pH: 7 (ref 5.0–8.0)

## 2024-03-08 NOTE — Telephone Encounter (Signed)
 Post ED Visit - Positive Culture Follow-up: Successful Patient Follow-Up  Culture assessed and recommendations reviewed by:  [x]  Leonor Bash, Pharm.D. []  Venetia Gully, Pharm.D., BCPS AQ-ID []  Garrel Crews, Pharm.D., BCPS []  Almarie Lunger, [address].D., BCPS []  [place], [address].D., BCPS, AAHIVP []  Rosaline Bihari, Pharm.D., BCPS, AAHIVP []  Vernell Meier, PharmD, BCPS []  Latanya Hint, PharmD, BCPS []  Donald Medley, PharmD, BCPS []  Rocky Bold, PharmD  Positive Blood culture  [x]  Patient discharged without antimicrobial prescription and treatment is now indicated []  Organism is resistant to prescribed ED discharge antimicrobial []  Patient with positive blood cultures  Changes discussed with ED provider: [name], PA  Plan: notify pt to return to her PCP or the ED for repeat blood cultures. Pt returned call and was notified. Pt states she will return to the ED.   Contacted patient, date 03/08/24, time 11:20 am   Melissa Schmidt 03/08/2024, 11:21 AM

## 2024-03-08 NOTE — ED Triage Notes (Signed)
 Pt comes in for abnormal labs. Pt was called by our hospital to come get retested. (It looks like + Blood Cultures) Pt does not have any s/s. All that is left from the flu is a cough. A&Ox4.

## 2024-03-08 NOTE — Telephone Encounter (Signed)
 Post ED Visit - Positive Culture Follow-up: Unsuccessful Patient Follow-up  Culture assessed and recommendations reviewed by:  [x]  Leonor Bash, Pharm.D. []  Venetia Gully, Pharm.D., BCPS AQ-ID []  Garrel Crews, Pharm.D., BCPS []  Almarie Lunger, [address].D., BCPS []  [employer], [place].D., BCPS, AAHIVP []  Rosaline Bihari, Pharm.D., BCPS, AAHIVP []  Massie Rigg, PharmD []  Jodie Rower, PharmD, BCPS  Positive blood culture  [x]  Patient discharged without antimicrobial prescription and treatment is now indicated []  Organism is resistant to prescribed ED discharge antimicrobial []  Patient with positive blood cultures  Plan call pt to return to PCP or ED for repeat blood cultures. per ED provider [name], PA   Unable to contact patient after 3 attempts, letter will be sent to address on file  Melissa Schmidt 03/08/2024, 9:36 AM

## 2024-03-09 LAB — BASIC METABOLIC PANEL WITH GFR
Anion gap: 14 (ref 5–15)
BUN: 12 mg/dL (ref 6–20)
CO2: 24 mmol/L (ref 22–32)
Calcium: 9.3 mg/dL (ref 8.9–10.3)
Chloride: 105 mmol/L (ref 98–111)
Creatinine, Ser: 0.72 mg/dL (ref 0.44–1.00)
GFR, Estimated: >60 mL/min
Glucose, Bld: 95 mg/dL (ref 70–99)
Potassium: 3.7 mmol/L (ref 3.5–5.1)
Sodium: 142 mmol/L (ref 135–145)

## 2024-03-09 LAB — CBC WITH DIFFERENTIAL/PLATELET
Abs Immature Granulocytes: 0.06 10*3/uL (ref 0.00–0.07)
Basophils Absolute: 0 10*3/uL (ref 0.0–0.1)
Basophils Relative: 0 %
Eosinophils Absolute: 0.1 10*3/uL (ref 0.0–0.5)
Eosinophils Relative: 1 %
HCT: 37 % (ref 36.0–46.0)
Hemoglobin: 11.5 g/dL — ABNORMAL LOW (ref 12.0–15.0)
Immature Granulocytes: 1 %
Lymphocytes Relative: 24 %
Lymphs Abs: 3.1 10*3/uL (ref 0.7–4.0)
MCH: 24.8 pg — ABNORMAL LOW (ref 26.0–34.0)
MCHC: 31.1 g/dL (ref 30.0–36.0)
MCV: 79.9 fL — ABNORMAL LOW (ref 80.0–100.0)
Monocytes Absolute: 0.7 10*3/uL (ref 0.1–1.0)
Monocytes Relative: 5 %
Neutro Abs: 9.2 10*3/uL — ABNORMAL HIGH (ref 1.7–7.7)
Neutrophils Relative %: 69 %
Platelets: 405 10*3/uL — ABNORMAL HIGH (ref 150–400)
RBC: 4.63 MIL/uL (ref 3.87–5.11)
RDW: 15.8 % — ABNORMAL HIGH (ref 11.5–15.5)
WBC: 13.2 10*3/uL — ABNORMAL HIGH (ref 4.0–10.5)
nRBC: 0 % (ref 0.0–0.2)

## 2024-03-09 MED ORDER — SODIUM CHLORIDE 0.9 % IV BOLUS
1000.0000 mL | Freq: Once | INTRAVENOUS | Status: AC
  Administered 2024-03-09: 1000 mL via INTRAVENOUS

## 2024-03-09 MED ORDER — IBUPROFEN 400 MG PO TABS
600.0000 mg | ORAL_TABLET | Freq: Once | ORAL | Status: AC
  Administered 2024-03-09: 600 mg via ORAL
  Filled 2024-03-09: qty 2

## 2024-03-09 MED ORDER — LEVOFLOXACIN IN D5W 750 MG/150ML IV SOLN
750.0000 mg | Freq: Once | INTRAVENOUS | Status: AC
  Administered 2024-03-09: 750 mg via INTRAVENOUS
  Filled 2024-03-09: qty 150

## 2024-03-09 MED ORDER — LEVOFLOXACIN 750 MG PO TABS: 750.0000 mg | ORAL_TABLET | Freq: Every day | ORAL | 0 refills | Status: AC

## 2024-03-09 NOTE — Discharge Instructions (Addendum)
 Begin taking Levaquin  as prescribed.  Follow-up with your doctor this week for a recheck, and return to the ER if you develop high fevers, severe weakness, or for other new and concerning symptoms.

## 2024-03-13 ENCOUNTER — Encounter (INDEPENDENT_AMBULATORY_CARE_PROVIDER_SITE_OTHER): Admitting: Otolaryngology

## 2024-03-14 ENCOUNTER — Encounter (INDEPENDENT_AMBULATORY_CARE_PROVIDER_SITE_OTHER): Payer: Self-pay | Admitting: Otolaryngology

## 2024-03-14 ENCOUNTER — Ambulatory Visit (INDEPENDENT_AMBULATORY_CARE_PROVIDER_SITE_OTHER): Admitting: Otolaryngology

## 2024-03-14 ENCOUNTER — Ambulatory Visit: Admitting: Gastroenterology

## 2024-03-14 VITALS — BP 122/80 | HR 88

## 2024-03-14 DIAGNOSIS — J343 Hypertrophy of nasal turbinates: Secondary | ICD-10-CM

## 2024-03-14 LAB — CULTURE, BLOOD (ROUTINE X 2)
Culture: NO GROWTH
Culture: NO GROWTH
Special Requests: ADEQUATE
Special Requests: ADEQUATE

## 2024-03-14 NOTE — Progress Notes (Signed)
 Septum and turbinates are healing well.   Both [place] debrided.   Nasal saline irrigation as needed.   Recheck in 6 months.

## 2024-04-04 ENCOUNTER — Inpatient Hospital Stay

## 2024-04-04 ENCOUNTER — Ambulatory Visit: Admitting: Sports Medicine

## 2024-04-30 ENCOUNTER — Ambulatory Visit: Admitting: "Endocrinology

## 2024-04-30 ENCOUNTER — Encounter: Admitting: Nutrition

## 2024-05-14 ENCOUNTER — Ambulatory Visit: Admitting: Gastroenterology

## 2024-09-02 ENCOUNTER — Ambulatory Visit (HOSPITAL_COMMUNITY): Admitting: Registered Nurse

## 2024-09-12 ENCOUNTER — Ambulatory Visit (INDEPENDENT_AMBULATORY_CARE_PROVIDER_SITE_OTHER): Admitting: Otolaryngology
